# Patient Record
Sex: Male | Born: 1955 | Race: White | Hispanic: No | Marital: Single | Smoking: Former smoker
Health system: Southern US, Community
[De-identification: ages and names within clinical notes are randomized; demographics above are authoritative.]

## PROBLEM LIST (undated history)

## (undated) DIAGNOSIS — I5020 Unspecified systolic (congestive) heart failure: Secondary | ICD-10-CM

## (undated) DIAGNOSIS — I447 Left bundle-branch block, unspecified: Secondary | ICD-10-CM

## (undated) DIAGNOSIS — I4891 Unspecified atrial fibrillation: Secondary | ICD-10-CM

## (undated) DIAGNOSIS — I10 Essential (primary) hypertension: Secondary | ICD-10-CM

## (undated) DIAGNOSIS — I509 Heart failure, unspecified: Secondary | ICD-10-CM

## (undated) DIAGNOSIS — Z95810 Presence of automatic (implantable) cardiac defibrillator: Secondary | ICD-10-CM

## (undated) DIAGNOSIS — I472 Ventricular tachycardia, unspecified: Secondary | ICD-10-CM

## (undated) DIAGNOSIS — I639 Cerebral infarction, unspecified: Secondary | ICD-10-CM

## (undated) DIAGNOSIS — R55 Syncope and collapse: Secondary | ICD-10-CM

## (undated) DIAGNOSIS — I739 Peripheral vascular disease, unspecified: Secondary | ICD-10-CM

## (undated) DIAGNOSIS — E119 Type 2 diabetes mellitus without complications: Secondary | ICD-10-CM

## (undated) DIAGNOSIS — I519 Heart disease, unspecified: Secondary | ICD-10-CM

## (undated) DIAGNOSIS — E0500 Thyrotoxicosis with diffuse goiter without thyrotoxic crisis or storm: Secondary | ICD-10-CM

## (undated) DIAGNOSIS — J439 Emphysema, unspecified: Secondary | ICD-10-CM

## (undated) DIAGNOSIS — I4729 Other ventricular tachycardia: Secondary | ICD-10-CM

## (undated) DIAGNOSIS — E079 Disorder of thyroid, unspecified: Secondary | ICD-10-CM

## (undated) DIAGNOSIS — E785 Hyperlipidemia, unspecified: Secondary | ICD-10-CM

## (undated) DIAGNOSIS — R0600 Dyspnea, unspecified: Secondary | ICD-10-CM

## (undated) DIAGNOSIS — I5042 Chronic combined systolic (congestive) and diastolic (congestive) heart failure: Secondary | ICD-10-CM

## (undated) DIAGNOSIS — I731 Thromboangiitis obliterans [Buerger's disease]: Secondary | ICD-10-CM

## (undated) DIAGNOSIS — E669 Obesity, unspecified: Secondary | ICD-10-CM

## (undated) DIAGNOSIS — I2699 Other pulmonary embolism without acute cor pulmonale: Secondary | ICD-10-CM

## (undated) DIAGNOSIS — I428 Other cardiomyopathies: Secondary | ICD-10-CM

## (undated) DIAGNOSIS — R413 Other amnesia: Secondary | ICD-10-CM

## (undated) DIAGNOSIS — I5189 Other ill-defined heart diseases: Secondary | ICD-10-CM

## (undated) HISTORY — DX: Obesity, unspecified: E66.9

## (undated) HISTORY — DX: Unspecified systolic (congestive) heart failure: I50.20

## (undated) HISTORY — DX: Thyrotoxicosis with diffuse goiter without thyrotoxic crisis or storm: E05.00

## (undated) HISTORY — PX: OTHER SURGICAL HISTORY: SHX169

## (undated) HISTORY — DX: Disorder of thyroid, unspecified: E07.9

## (undated) HISTORY — DX: Aphasia: I63.9

## (undated) HISTORY — PX: FOOT SURGERY: SHX648

## (undated) HISTORY — DX: Heart failure, unspecified: I50.9

## (undated) HISTORY — DX: Type 2 diabetes mellitus without complications: E11.9

## (undated) HISTORY — DX: Ventricular tachycardia, unspecified: I47.20

## (undated) HISTORY — DX: Peripheral vascular disease, unspecified: I73.9

## (undated) HISTORY — DX: Syncope and collapse: R55

## (undated) HISTORY — DX: Other pulmonary embolism without acute cor pulmonale: I26.99

## (undated) HISTORY — DX: Chronic combined systolic (congestive) and diastolic (congestive) heart failure: I50.42

## (undated) HISTORY — DX: Other cardiomyopathies: I42.8

## (undated) HISTORY — DX: Emphysema, unspecified: J43.9

## (undated) HISTORY — DX: Other ventricular tachycardia: I47.29

## (undated) HISTORY — DX: Unspecified atrial fibrillation: I48.91

## (undated) HISTORY — DX: Heart disease, unspecified: I51.9

## (undated) HISTORY — DX: Left bundle-branch block, unspecified: I44.7

## (undated) HISTORY — DX: Hyperlipidemia, unspecified: E78.5

## (undated) HISTORY — DX: Other ill-defined heart diseases: I51.89

## (undated) HISTORY — DX: Cerebral infarction, unspecified: I63.9

## (undated) HISTORY — DX: Presence of automatic (implantable) cardiac defibrillator: Z95.810

## (undated) HISTORY — DX: Essential (primary) hypertension: I10

## (undated) HISTORY — DX: Thromboangiitis obliterans (Buerger's disease): I73.1

---

## 2009-07-17 DIAGNOSIS — S61012A Laceration without foreign body of left thumb without damage to nail, initial encounter: Secondary | ICD-10-CM | POA: Insufficient documentation

## 2014-12-19 ENCOUNTER — Emergency Department: Payer: Medicaid - Out of State

## 2014-12-19 ENCOUNTER — Encounter: Payer: Self-pay | Admitting: Emergency Medicine

## 2014-12-19 ENCOUNTER — Observation Stay
Admission: EM | Admit: 2014-12-19 | Discharge: 2014-12-20 | Disposition: A | Discharge status: Home / Self Care | Payer: Medicaid - Out of State | Attending: Internal Medicine | Admitting: Internal Medicine

## 2014-12-19 ENCOUNTER — Observation Stay
Admit: 2014-12-19 | Discharge: 2014-12-19 | Disposition: A | Discharge status: Home / Self Care | Payer: Medicaid - Out of State | Attending: Internal Medicine | Admitting: Internal Medicine

## 2014-12-19 DIAGNOSIS — J90 Pleural effusion, not elsewhere classified: Secondary | ICD-10-CM | POA: Diagnosis present

## 2014-12-19 DIAGNOSIS — F17210 Nicotine dependence, cigarettes, uncomplicated: Secondary | ICD-10-CM | POA: Diagnosis not present

## 2014-12-19 DIAGNOSIS — Z79899 Other long term (current) drug therapy: Secondary | ICD-10-CM | POA: Diagnosis not present

## 2014-12-19 DIAGNOSIS — J189 Pneumonia, unspecified organism: Secondary | ICD-10-CM | POA: Diagnosis present

## 2014-12-19 DIAGNOSIS — I824Z1 Acute embolism and thrombosis of unspecified deep veins of right distal lower extremity: Secondary | ICD-10-CM | POA: Diagnosis not present

## 2014-12-19 DIAGNOSIS — I2699 Other pulmonary embolism without acute cor pulmonale: Secondary | ICD-10-CM | POA: Diagnosis present

## 2014-12-19 DIAGNOSIS — R0602 Shortness of breath: Secondary | ICD-10-CM | POA: Insufficient documentation

## 2014-12-19 DIAGNOSIS — I5020 Unspecified systolic (congestive) heart failure: Secondary | ICD-10-CM | POA: Diagnosis present

## 2014-12-19 DIAGNOSIS — R079 Chest pain, unspecified: Secondary | ICD-10-CM | POA: Diagnosis not present

## 2014-12-19 DIAGNOSIS — J9811 Atelectasis: Secondary | ICD-10-CM | POA: Insufficient documentation

## 2014-12-19 DIAGNOSIS — R05 Cough: Secondary | ICD-10-CM | POA: Insufficient documentation

## 2014-12-19 DIAGNOSIS — Z86711 Personal history of pulmonary embolism: Secondary | ICD-10-CM | POA: Insufficient documentation

## 2014-12-19 DIAGNOSIS — Z8249 Family history of ischemic heart disease and other diseases of the circulatory system: Secondary | ICD-10-CM | POA: Insufficient documentation

## 2014-12-19 DIAGNOSIS — I420 Dilated cardiomyopathy: Secondary | ICD-10-CM | POA: Diagnosis not present

## 2014-12-19 DIAGNOSIS — I5042 Chronic combined systolic (congestive) and diastolic (congestive) heart failure: Secondary | ICD-10-CM | POA: Diagnosis present

## 2014-12-19 LAB — BASIC METABOLIC PANEL
Anion gap: 7 (ref 5–15)
BUN: 22 mg/dL — AB (ref 6–20)
CHLORIDE: 111 mmol/L (ref 101–111)
CO2: 22 mmol/L (ref 22–32)
CREATININE: 1.22 mg/dL (ref 0.61–1.24)
Calcium: 9.1 mg/dL (ref 8.9–10.3)
GFR calc Af Amer: >60 mL/min (ref >60)
GFR calc non Af Amer: >60 mL/min (ref >60)
GLUCOSE: 117 mg/dL — AB (ref 65–99)
POTASSIUM: 4.3 mmol/L (ref 3.5–5.1)
SODIUM: 140 mmol/L (ref 135–145)

## 2014-12-19 LAB — HEPATIC FUNCTION PANEL
ALBUMIN: 3.5 g/dL (ref 3.5–5.0)
ALT: 17 U/L (ref 17–63)
AST: 16 U/L (ref 15–41)
Alkaline Phosphatase: 62 U/L (ref 38–126)
BILIRUBIN DIRECT: 0.2 mg/dL (ref 0.1–0.5)
Indirect Bilirubin: 0.5 mg/dL (ref 0.3–0.9)
TOTAL PROTEIN: 6.9 g/dL (ref 6.5–8.1)
Total Bilirubin: 0.7 mg/dL (ref 0.3–1.2)

## 2014-12-19 LAB — TROPONIN I
TROPONIN I: 0.04 ng/mL — AB (ref <0.031)
TROPONIN I: 0.04 ng/mL — AB (ref <0.031)
TROPONIN I: 0.04 ng/mL — AB (ref <0.031)
Troponin I: 0.03 ng/mL (ref <0.031)
Troponin I: 0.04 ng/mL — ABNORMAL HIGH (ref <0.031)

## 2014-12-19 LAB — HEPARIN LEVEL (UNFRACTIONATED): Heparin Unfractionated: <0.1 IU/mL — ABNORMAL LOW (ref 0.30–0.70)

## 2014-12-19 LAB — APTT: APTT: 29 s (ref 24–36)

## 2014-12-19 LAB — PROTIME-INR
INR: 1.1
PROTHROMBIN TIME: 14.4 s (ref 11.4–15.0)

## 2014-12-19 LAB — CBC
HCT: 47.6 % (ref 40.0–52.0)
HEMOGLOBIN: 15.7 g/dL (ref 13.0–18.0)
MCH: 30.7 pg (ref 26.0–34.0)
MCHC: 33 g/dL (ref 32.0–36.0)
MCV: 93 fL (ref 80.0–100.0)
Platelets: 275 10*3/uL (ref 150–440)
RBC: 5.12 MIL/uL (ref 4.40–5.90)
RDW: 13.5 % (ref 11.5–14.5)
WBC: 12.3 10*3/uL — ABNORMAL HIGH (ref 3.8–10.6)

## 2014-12-19 LAB — BRAIN NATRIURETIC PEPTIDE: B Natriuretic Peptide: 794 pg/mL — ABNORMAL HIGH (ref 0.0–100.0)

## 2014-12-19 LAB — ANTITHROMBIN III: AntiThromb III Func: 110 % (ref 75–120)

## 2014-12-19 MED ORDER — IOHEXOL 350 MG/ML SOLN
100.0000 mL | Freq: Once | INTRAVENOUS | Status: AC | PRN
  Administered 2014-12-19: 100 mL via INTRAVENOUS

## 2014-12-19 MED ORDER — HEPARIN BOLUS VIA INFUSION
5000.0000 [IU] | Freq: Once | INTRAVENOUS | Status: AC
  Administered 2014-12-19: 5000 [IU] via INTRAVENOUS
  Filled 2014-12-19: qty 5000

## 2014-12-19 MED ORDER — HEPARIN SODIUM (PORCINE) 5000 UNIT/ML IJ SOLN
4000.0000 [IU] | Freq: Once | INTRAMUSCULAR | Status: DC
  Administered 2014-12-19: 4000 [IU] via INTRAVENOUS

## 2014-12-19 MED ORDER — POLYETHYLENE GLYCOL 3350 17 G PO PACK: 17.0000 g | PACK | Freq: Every day | ORAL | Status: DC | PRN

## 2014-12-19 MED ORDER — IPRATROPIUM-ALBUTEROL 0.5-2.5 (3) MG/3ML IN SOLN
3.0000 mL | Freq: Once | RESPIRATORY_TRACT | Status: AC
  Administered 2014-12-19: 3 mL via RESPIRATORY_TRACT
  Filled 2014-12-19: qty 3

## 2014-12-19 MED ORDER — ACETAMINOPHEN 650 MG RE SUPP
650.0000 mg | Freq: Four times a day (QID) | RECTAL | Status: DC | PRN
Start: 2014-12-19 — End: 2014-12-20

## 2014-12-19 MED ORDER — ONDANSETRON HCL 4 MG PO TABS: 4.0000 mg | ORAL_TABLET | Freq: Four times a day (QID) | ORAL | Status: DC | PRN

## 2014-12-19 MED ORDER — HEPARIN (PORCINE) IN NACL 100-0.45 UNIT/ML-% IJ SOLN
2150.0000 [IU]/h | INTRAMUSCULAR | Status: DC
  Administered 2014-12-19: 1800 [IU]/h via INTRAVENOUS
  Administered 2014-12-19: 2150 [IU]/h via INTRAVENOUS
  Filled 2014-12-19 (×6): qty 250

## 2014-12-19 MED ORDER — SODIUM CHLORIDE 0.9 % IJ SOLN: 3.0000 mL | Freq: Two times a day (BID) | INTRAMUSCULAR | Status: DC

## 2014-12-19 MED ORDER — ACETAMINOPHEN 325 MG PO TABS
650.0000 mg | ORAL_TABLET | Freq: Four times a day (QID) | ORAL | Status: DC | PRN
Start: 2014-12-19 — End: 2014-12-20

## 2014-12-19 MED ORDER — HEPARIN (PORCINE) IN NACL 100-0.45 UNIT/ML-% IJ SOLN
12.0000 [IU]/kg/h | Freq: Once | INTRAMUSCULAR | Status: DC
  Administered 2014-12-19: 12 [IU]/kg/h via INTRAVENOUS

## 2014-12-19 MED ORDER — SODIUM CHLORIDE 0.9 % IJ SOLN: 3.0000 mL | INTRAMUSCULAR | Status: DC | PRN

## 2014-12-19 MED ORDER — ONDANSETRON HCL 4 MG/2ML IJ SOLN: 4.0000 mg | Freq: Four times a day (QID) | INTRAMUSCULAR | Status: DC | PRN

## 2014-12-19 MED ORDER — LEVOFLOXACIN 750 MG PO TABS
750.0000 mg | ORAL_TABLET | Freq: Once | ORAL | Status: AC
  Administered 2014-12-19: 750 mg via ORAL
  Filled 2014-12-19: qty 1

## 2014-12-19 MED ORDER — HEPARIN BOLUS VIA INFUSION
3400.0000 [IU] | Freq: Once | INTRAVENOUS | Status: AC
  Administered 2014-12-19: 3400 [IU] via INTRAVENOUS
  Filled 2014-12-19: qty 3400

## 2014-12-19 MED ORDER — HYDROCODONE-ACETAMINOPHEN 5-325 MG PO TABS: 1.0000 | ORAL_TABLET | ORAL | Status: DC | PRN

## 2014-12-19 NOTE — H&P (Signed)
[facility] [place] at [facility]   PATIENT NAME: Henry Schneider    MR#:  [id number]  DATE OF BIRTH:  28-Nov-1955  DATE OF ADMISSION:  12/19/2014  PRIMARY CARE PHYSICIAN: No PCP Per Patient   REQUESTING/REFERRING PHYSICIAN: Dr. Daryel November  CHIEF COMPLAINT:   Chief Complaint  Patient presents with  . Shortness of Breath    HISTORY OF PRESENT ILLNESS:  Henry Schneider  is a 59 y.o. male with no significant medical history who presents to our emergency room with mild chest pain for 2 weeks and acute shortness of breath. He reports that about 2 weeks ago he was helping his mother move and that night when he went to bed felt sore on the left chest. The next day when he went to bed he realized he was sore on the right chest and back. For about one week he has noted that he has been short of breath with exertion. This morning he was preparing to drive back to [place] and felt so short of breath and lightheaded that he realized he would not be able to drive. Instead he came to our emergency room. CT angiography has shown pulmonary emboli in the right upper lobe and. There are also enlarged perihilar lymph nodes and a small right-sided pleural effusion with atelectasis versus infiltrate on the right. He is being admitted for further evaluation.  PAST MEDICAL HISTORY:  History reviewed. No pertinent past medical history.  PAST SURGICAL HISTORY:  History reviewed. No pertinent past surgical history.  SOCIAL HISTORY:   Social History  Substance Use Topics  . Smoking status: Current Every Day Smoker -- 1.00 packs/day    Types: Cigarettes  . Smokeless tobacco: Not on file  . Alcohol Use: 0.6 oz/week    1 Cans of beer per week    FAMILY HISTORY:   Family History  Problem Relation Age of Onset  . Coronary artery disease Mother   . Coronary artery disease Father     DRUG ALLERGIES:  No Known Allergies  REVIEW OF SYSTEMS:   Review of Systems   Constitutional: Negative for fever, chills, weight loss and malaise/fatigue.  HENT: Negative for congestion and hearing loss.   Eyes: Negative for blurred vision and pain.  Respiratory: Positive for shortness of breath. Negative for cough, hemoptysis, sputum production and stridor.   Cardiovascular: Positive for chest pain. Negative for palpitations, orthopnea and leg swelling.  Gastrointestinal: Negative for nausea, vomiting, abdominal pain, diarrhea, constipation and blood in stool.  Genitourinary: Negative for dysuria and frequency.  Musculoskeletal: Negative for myalgias, back pain, joint pain and neck pain.  Skin: Negative for rash.  Neurological: Negative for focal weakness, loss of consciousness and headaches.  Endo/Heme/Allergies: Does not bruise/bleed easily.  Psychiatric/Behavioral: Negative for depression and hallucinations. The patient is not nervous/anxious.     MEDICATIONS AT HOME:   Prior to Admission medications   Not on File      VITAL SIGNS:  Blood pressure 98/76, pulse 98, temperature 97.8 F (36.6 C), temperature source Oral, resp. rate 18, height  (1.93 m), weight 127.053 kg (280 lb 1.6 oz), SpO2 93 %.  PHYSICAL EXAMINATION:  GENERAL:  59 y.o.-year-old patient sitting in the bed with no acute distress.  EYES: Pupils equal, round, reactive to light and accommodation. No scleral icterus. Extraocular muscles intact.  HEENT: Head atraumatic, normocephalic. Oropharynx and nasopharynx clear.  NECK:  Supple, no jugular venous distention. No thyroid enlargement, no tenderness.  LUNGS: Normal breath sounds  bilaterally, no wheezing, rales,rhonchi or crepitation. No use of accessory muscles of respiration. No shortness of breath CARDIOVASCULAR: S1, S2 normal. No murmurs, rubs, or gallops.  ABDOMEN: Soft, nontender, nondistended. Bowel sounds present. No organomegaly or mass.  EXTREMITIES: No pedal edema, cyanosis, or clubbing. Referral pulses are 2+ NEUROLOGIC:  Cranial nerves II through XII are intact. Muscle strength 5/5 in all extremities. Sensation intact. Gait not checked.  PSYCHIATRIC: The patient is alert and oriented x 3.  SKIN: No obvious rash, lesion, or ulcer.   LABORATORY PANEL:   CBC  Recent Labs Lab 12/19/14 0636  WBC 12.3*  HGB 15.7  HCT 47.6  PLT 275   ------------------------------------------------------------------------------------------------------------------  Chemistries   Recent Labs Lab 12/19/14 0636 12/19/14 1208  NA 140  --   K 4.3  --   CL 111  --   CO2 22  --   GLUCOSE 117*  --   BUN 22*  --   CREATININE 1.22  --   CALCIUM 9.1  --   AST  --  16  ALT  --  17  ALKPHOS  --  62  BILITOT  --  0.7   ------------------------------------------------------------------------------------------------------------------  Cardiac Enzymes  Recent Labs Lab 12/19/14 1208  TROPONINI 0.04*   ------------------------------------------------------------------------------------------------------------------  RADIOLOGY:  Ct Angio Chest Pe W/cm &/or Wo Cm  12/19/2014  CLINICAL DATA:  Chest pain, dyspnea EXAM: CT ANGIOGRAPHY CHEST WITH CONTRAST TECHNIQUE: Multidetector CT imaging of the chest was performed using the standard protocol during bolus administration of intravenous contrast. Multiplanar CT image reconstructions and MIPs were obtained to evaluate the vascular anatomy. CONTRAST:  OMNIPAQUE IOHEXOL 350 MG/ML SOLN COMPARISON:  None. FINDINGS: Images of the thoracic inlet are unremarkable. Central airways are patent. No mediastinal hematoma or adenopathy. The study is of excellent technical quality. Borderline cardiomegaly. There is small right pleural effusion with right basilar atelectasis or infiltrate. A right hilar lymph node measures 1.4 by 1.1 cm borderline by size criteria. Left hilar lymph node measures 1.6 by 1.2 cm borderline enlarged. Axial image 60 there is pulmonary embolus noted in right upper  lobe lobar branch extending in 2 segmental branches axial image 55. Additional pulmonary embolus noted in axial image 40 right upper lobe. The right ventricle over left ventricle ratio is 0.8 without significant right heart strain. There is patchy atelectasis or infiltrate in right base anteriorly. Bilateral perihilar bronchitic changes. The visualized upper abdomen shows mild thickening of left adrenal gland without definite evidence of mass. No calcified gallstones are noted within gallbladder. There is no intrahepatic biliary ductal dilatation. Review of the MIP images confirms the above findings. IMPRESSION: 1. Right pulmonary emboli as described above. 2. Bilateral perihilar bronchitic changes. Borderline enlarged bilateral hilar lymph nodes probable reactive. 3. There is small right pleural effusion with right lower lobe atelectasis or infiltrate. 4. The ratio right ventricle over left ventricle is 0.8 without significant right heart strain. These results were called by telephone at the time of interpretation on 12/19/2014 at 9:24 am to Dr. Daryel November , who verbally acknowledged these results. Electronically Signed   By: Natasha Mead M.D.   On: 12/19/2014 09:25   US Venous Img Lower Bilateral  12/19/2014  CLINICAL DATA:  59 year old male with history of pulmonary emboli an acute onset of shortness of breath with cough, chest pain and dyspnea. Shortness of breath for 1 week. EXAM: BILATERAL LOWER EXTREMITY VENOUS DOPPLER ULTRASOUND TECHNIQUE: Gray-scale sonography with graded compression, as well as color Doppler and duplex ultrasound  were performed to evaluate the lower extremity deep venous systems from the level of the common femoral vein and including the common femoral, femoral, profunda femoral, popliteal and calf veins including the posterior tibial, peroneal and gastrocnemius veins when visible. The superficial great saphenous vein was also interrogated. Spectral Doppler was utilized to  evaluate flow at rest and with distal augmentation maneuvers in the common femoral, femoral and popliteal veins. COMPARISON:  None. FINDINGS: RIGHT LOWER EXTREMITY Common Femoral Vein: No evidence of thrombus. Normal compressibility, respiratory phasicity and response to augmentation. Saphenofemoral Junction: No evidence of thrombus. Normal compressibility and flow on color Doppler imaging. Profunda Femoral Vein: No evidence of thrombus. Normal compressibility and flow on color Doppler imaging. Femoral Vein: No evidence of thrombus. Normal compressibility, respiratory phasicity and response to augmentation. Popliteal Vein: No evidence of thrombus. Normal compressibility, respiratory phasicity and response to augmentation. Calf Veins: Occlusive thrombus in the right gastrocnemius vein. Superficial Great Saphenous Vein: No evidence of thrombus. Normal compressibility and flow on color Doppler imaging. Venous Reflux:  None. Other Findings:  None. LEFT LOWER EXTREMITY Common Femoral Vein: No evidence of thrombus. Normal compressibility, respiratory phasicity and response to augmentation. Saphenofemoral Junction: No evidence of thrombus. Normal compressibility and flow on color Doppler imaging. Profunda Femoral Vein: No evidence of thrombus. Normal compressibility and flow on color Doppler imaging. Femoral Vein: No evidence of thrombus. Normal compressibility, respiratory phasicity and response to augmentation. Popliteal Vein: No evidence of thrombus. Normal compressibility, respiratory phasicity and response to augmentation. Calf Veins: No evidence of thrombus. Normal compressibility and flow on color Doppler imaging. Superficial Great Saphenous Vein: No evidence of thrombus. Normal compressibility and flow on color Doppler imaging. Venous Reflux:  None. Other Findings:  None. IMPRESSION: 1. Study is positive for occlusive thrombus in the right gastrocnemius vein. 2. No evidence of deep venous thrombosis in the left  lower extremity. Electronically Signed   By: Trudie Reedaniel  Entrikin M.D.   On: 12/19/2014 10:46   Dg Chest Portable 1 View  12/19/2014  CLINICAL DATA:  Acute onset of shortness of breath and cough. Initial encounter. EXAM: PORTABLE CHEST 1 VIEW COMPARISON:  None. FINDINGS: The lungs are well-aerated. Right basilar airspace opacification is compatible with pneumonia. Pulmonary vascularity is at the upper limits of normal. There is no evidence of pleural effusion or pneumothorax. The cardiomediastinal silhouette is borderline normal in size. No acute osseous abnormalities are seen. IMPRESSION: Right basilar pneumonia noted. Electronically Signed   By: Roanna RaiderJeffery  Chang M.D.   On: 12/19/2014 06:54    EKG:   Orders placed or performed during the hospital encounter of 12/19/14  . ED EKG  . ED EKG  . EKG 12-Lead  . EKG 12-Lead    IMPRESSION AND PLAN:   1. Multiple pulmonary emboli: Has been started on heparin drip in the emergency room will continue for now. 2-D echocardiogram pending. Lower actually Dopplers pending. Likely due to prolonged sitting [place]. Have also obtained a hypercoagulability panel to look for predisposing factors. Will likely be able to discharge on eliquis tomorrow. Will admit for observation. Monitor on telemetry.  2. Ongoing tobacco abuse: Patient states that he quit smoking 4 days ago. Prior to that he was a one pack per day smoker. Smoking cessation counseling provided today for 3-5 minutes.  All the records are reviewed and case discussed with ED provider. Management plans discussed with the patient, family and they are in agreement.  CODE STATUS: Full   TOTAL TIME  TAKING CARE OF THIS PATIENT: 35 minutes.  Greater than 50% of time spent in coordination of care and counseling.  Elby Showers M.D on 12/19/2014 at 2:42 PM  Between 7am to 6pm - Pager - [phone]  After 6pm go to [email or web address] - password [email or web address] [facility]  [facility]  Office  [phone]  CC: Primary care physician; No PCP Per Patient

## 2014-12-19 NOTE — ED Notes (Signed)
Dr. Williams notified of critical trop of 0.04 

## 2014-12-19 NOTE — Progress Notes (Addendum)
ANTICOAGULATION CONSULT NOTE - Initial Consult  Pharmacy Consult for Heparin  Indication: pulmonary embolus  No Known Allergies  Patient Measurements: Height: 6\' 4"  (193 cm) Weight: 280 lb 1.6 oz (127.053 kg) IBW/kg (Calculated) : 86.8 Heparin Dosing Weight: 112  Vital Signs: Temp: 97.8 F (36.6 C) (11/21 1059) Temp Source: Oral (11/21 1059) BP: 98/76 mmHg (11/21 1059) Pulse Rate: 98 (11/21 1059)  Labs:  Recent Labs  12/19/14 0636 12/19/14 0828 12/19/14 1208 12/19/14 1425 12/19/14 1551  HGB 15.7  --   --   --   --   HCT 47.6  --   --   --   --   PLT 275  --   --   --   --   APTT 29  --   --   --   --   LABPROT 14.4  --   --   --   --   INR 1.10  --   --   --   --   HEPARINUNFRC  --   --   --  <0.10*  --   CREATININE 1.22  --   --   --   --   TROPONINI 0.04* 0.03 0.04*  --  0.04*    Estimated Creatinine Clearance: 97.2 mL/min (by C-G formula based on Cr of 1.22).   Medical History: History reviewed. No pertinent past medical history.  Medications:  Infusions:  . heparin 1,800 Units/hr (12/19/14 1032)    Assessment: 59 yo male admitted with multiple pulmonary emboli and pleural effusion per images.  Goal of Therapy:  Heparin level 0.3-0.7 units/ml Monitor platelets by anticoagulation protocol: Yes   Plan:  Give 5000 units bolus x 1 Start heparin infusion at 1800 units/hr Check anti-Xa level in 6 hours and daily while on heparin Continue to monitor H&H and platelets   1121  :  HL @ 14:30 = < 0.1               Will order Heparin bolus 3400 units and increase drip rate to 2150 units/hr.              Will recheck HL 6 hrs after rate change on 11/22 @ 0100.   Jahmeek Shirk D 12/19/2014,6:41 PM

## 2014-12-19 NOTE — Progress Notes (Signed)
ANTICOAGULATION CONSULT NOTE - Initial Consult  Pharmacy Consult for Heparin  Indication: pulmonary embolus  No Known Allergies  Patient Measurements: Height: 6\' 4"  (193 cm) Weight: 270 lb (122.471 kg) IBW/kg (Calculated) : 86.8 Heparin Dosing Weight: 112  Vital Signs: Temp: 97.6 F (36.4 C) (11/21 0633) Temp Source: Oral (11/21 16100633) BP: 111/84 mmHg (11/21 0900) Pulse Rate: 105 (11/21 0900)  Labs:  Recent Labs  12/19/14 0636 12/19/14 0828  HGB 15.7  --   HCT 47.6  --   PLT 275  --   APTT 29  --   LABPROT 14.4  --   INR 1.10  --   CREATININE 1.22  --   TROPONINI 0.04* 0.03    Estimated Creatinine Clearance: 95.5 mL/min (by C-G formula based on Cr of 1.22).   Medical History: History reviewed. No pertinent past medical history.  Medications:  Infusions:  . heparin 1,800 Units/hr (12/19/14 1032)    Assessment: 59 yo male admitted with multiple pulmonary emboli and pleural effusion per images.  Goal of Therapy:  Heparin level 0.3-0.7 units/ml Monitor platelets by anticoagulation protocol: Yes   Plan:  Give 5000 units bolus x 1 Start heparin infusion at 1800 units/hr Check anti-Xa level in 6 hours and daily while on heparin Continue to monitor H&H and platelets  Keymarion Bearman K 12/19/2014,10:00 AM

## 2014-12-19 NOTE — ED Notes (Signed)
Pt returned from CT, resting in bed in no acute distress 

## 2014-12-19 NOTE — Progress Notes (Signed)
Skin checked by Donivan ScullAmy Dalton RN. Pt reports no pain. NSR. Room air. Takes meds ok. Pt has no further concerns at this time.

## 2014-12-19 NOTE — ED Notes (Signed)
Heparin gtt requested from pharmacy

## 2014-12-19 NOTE — Progress Notes (Signed)
MD Nemiah CommanderKalisetti was made aware of positive US of lower legs.

## 2014-12-19 NOTE — ED Provider Notes (Signed)
[facility] Emergency Department Provider Note     Time seen: ----------------------------------------- 7:02 AM on 12/19/2014 -----------------------------------------    I have reviewed the triage vital signs and the nursing notes.   HISTORY  Chief Complaint Shortness of Breath    HPI Henry Schneider is a 59 y.o. male who presents ER for shortness breath last week with nonproductive cough.Patient denies any recent illness, denies history of same. Patient states he was assisting his sister moving a car when the symptoms began. Patient states breathing worsens with exertion and lying flat.  Past medical history: Berger's disease  There are no active problems to display for this patient.   History reviewed. No pertinent past surgical history.  Allergies Review of patient's allergies indicates no known allergies.  Social History Social History  Substance Use Topics  . Smoking status: Current Every Day Smoker -- 1.00 packs/day    Types: Cigarettes  . Smokeless tobacco: None  . Alcohol Use: No    Review of Systems Constitutional: Negative for fever. Eyes: Negative for visual changes. ENT: Negative for sore throat. Cardiovascular: Negative for chest pain. Respiratory: Positive for shortness of breath and cough Gastrointestinal: Negative for abdominal pain, vomiting and diarrhea. Genitourinary: Negative for dysuria. Musculoskeletal: Negative for back pain. Skin: Negative for rash. Neurological: Negative for headaches, focal weakness or numbness.  10-point ROS otherwise negative.  ____________________________________________   PHYSICAL EXAM:  VITAL SIGNS: ED Triage Vitals  Enc Vitals Group     BP 12/19/14 0633 128/104 mmHg     Pulse Rate 12/19/14 0633 115     Resp 12/19/14 0633 22     Temp 12/19/14 0633 97.6 F (36.4 C)     Temp Source 12/19/14 0633 Oral     SpO2 12/19/14 0633 100 %     Weight 12/19/14 0633 270 lb (122.471 kg)      Height 12/19/14 0633  (1.93 m)     Head Cir --      Peak Flow --      Pain Score --      Pain Loc --      Pain Edu? --      Excl. in GC? --     Constitutional: Alert and oriented. Well appearing and in no distress. Eyes: Conjunctivae are normal. PERRL. Normal extraocular movements. ENT   Head: Normocephalic and atraumatic.   Nose: No congestion/rhinnorhea.   Mouth/Throat: Mucous membranes are moist.   Neck: No stridor. Cardiovascular: Rapid rate, regular rhythm. Normal and symmetric distal pulses are present in all extremities. No murmurs, rubs, or gallops. Respiratory: Normal respiratory effort without tachypnea nor retractions. Breath sounds are clear and equal bilaterally. No wheezes/rales/rhonchi. Gastrointestinal: Soft and nontender. No distention. No abdominal bruits.  Musculoskeletal: Nontender with normal range of motion in all extremities. No joint effusions.  No lower extremity tenderness nor edema. Neurologic:  Normal speech and language. No gross focal neurologic deficits are appreciated. Speech is normal. No gait instability. Skin:  Skin is warm, dry and intact. No rash noted. Psychiatric: Mood and affect are normal. Speech and behavior are normal. Patient exhibits appropriate insight and judgment. ____________________________________________  EKG: Interpreted by me. Sinus tachycardia with a rate of 107 bpm, left bundle branch block, left axis deviation.  ____________________________________________  ED COURSE:  Pertinent labs & imaging results that were available during my care of the patient were reviewed by me and considered in my medical decision making (see chart for details). Patient received basic labs, chest x-ray and reevaluation.  ____________________________________________    LABS (pertinent positives/negatives)  Labs Reviewed  CBC - Abnormal; Notable for the following:    WBC 12.3 (*)    All other components within normal limits   BASIC METABOLIC PANEL - Abnormal; Notable for the following:    Glucose, Bld 117 (*)    BUN 22 (*)    All other components within normal limits  TROPONIN I - Abnormal; Notable for the following:    Troponin I 0.04 (*)    All other components within normal limits  BRAIN NATRIURETIC PEPTIDE - Abnormal; Notable for the following:    B Natriuretic Peptide 794.0 (*)    All other components within normal limits  TROPONIN I   CRITICAL CARE Performed by: Emily FilbertWilliams, Rynlee Lisbon E   Total critical care time: 30 minutes  Critical care time was exclusive of separately billable procedures and treating other patients.  Critical care was necessary to treat or prevent imminent or life-threatening deterioration.  Critical care was time spent personally by me on the following activities: development of treatment plan with patient and/or surrogate as well as nursing, discussions with consultants, evaluation of patient's response to treatment, examination of patient, obtaining history from patient or surrogate, ordering and performing treatments and interventions, ordering and review of laboratory studies, ordering and review of radiographic studies, pulse oximetry and re-evaluation of patient's condition.   RADIOLOGY Images were viewed by me   IMPRESSION: Right basilar pneumonia noted. IMPRESSION: 1. Right pulmonary emboli as described above. 2. Bilateral perihilar bronchitic changes. Borderline enlarged bilateral hilar lymph nodes probable reactive. 3. There is small right pleural effusion with right lower lobe atelectasis or infiltrate. 4. The ratio right ventricle over left ventricle is 0.8 without significant right heart strain. These results were called by telephone at the time of interpretation on 12/19/2014 at 9:24 am to Dr. Daryel NovemberJONATHAN Argentina Kosch , who verbally acknowledged these results. ____________________________________________  FINAL ASSESSMENT AND PLAN  Multiple pulmonary emboli,  pleural effusion  Plan: Patient with labs and imaging as dictated above. Patient initially presented more with pneumonia-type picture. He does not remember an acute event where he became short of breath. He is just noted gradual shortness of breath and increased shortness of breath with laying down or exertion. Patient is being placed on heparin, will need extremity Dopplers and observation.   Emily FilbertWilliams, Torrin Frein E, MD   Emily FilbertJonathan E Elion Hocker, MD 12/19/14 [phone]

## 2014-12-19 NOTE — Progress Notes (Signed)
*  PRELIMINARY RESULTS* Echocardiogram 2D Echocardiogram has been performed.  Georgann HousekeeperJerry R Hege 12/19/2014, 12:03 PM

## 2014-12-19 NOTE — ED Notes (Addendum)
Patient transported to Ultrasound, [facility] will transport pt when he returns

## 2014-12-19 NOTE — ED Notes (Signed)
Patient transported to CT 

## 2014-12-19 NOTE — ED Notes (Addendum)
Patient ambulatory to triage with steady gait, without difficulty or distress noted; pt reports SHOB x week with nonprod cough; denies any recent illness; denies hx of same; denies pain; st week ago was assisting his sister moving a chair when symptoms began; st breathing worsens with exertion and lying supine

## 2014-12-20 DIAGNOSIS — I5020 Unspecified systolic (congestive) heart failure: Secondary | ICD-10-CM | POA: Diagnosis present

## 2014-12-20 DIAGNOSIS — J189 Pneumonia, unspecified organism: Secondary | ICD-10-CM | POA: Diagnosis present

## 2014-12-20 DIAGNOSIS — I5042 Chronic combined systolic (congestive) and diastolic (congestive) heart failure: Secondary | ICD-10-CM | POA: Diagnosis present

## 2014-12-20 LAB — BASIC METABOLIC PANEL
Anion gap: 9 (ref 5–15)
BUN: 19 mg/dL (ref 6–20)
CHLORIDE: 110 mmol/L (ref 101–111)
CO2: 21 mmol/L — AB (ref 22–32)
CREATININE: 0.89 mg/dL (ref 0.61–1.24)
Calcium: 9.2 mg/dL (ref 8.9–10.3)
GFR calc non Af Amer: >60 mL/min (ref >60)
Glucose, Bld: 101 mg/dL — ABNORMAL HIGH (ref 65–99)
POTASSIUM: 4.2 mmol/L (ref 3.5–5.1)
Sodium: 140 mmol/L (ref 135–145)

## 2014-12-20 LAB — CBC
HCT: 45.1 % (ref 40.0–52.0)
HEMATOCRIT: 45.4 % (ref 40.0–52.0)
HEMOGLOBIN: 15.3 g/dL (ref 13.0–18.0)
HEMOGLOBIN: 15.3 g/dL (ref 13.0–18.0)
MCH: 31 pg (ref 26.0–34.0)
MCH: 30.8 pg (ref 26.0–34.0)
MCHC: 33.7 g/dL (ref 32.0–36.0)
MCHC: 33.8 g/dL (ref 32.0–36.0)
MCV: 91.9 fL (ref 80.0–100.0)
MCV: 91.1 fL (ref 80.0–100.0)
PLATELETS: 266 10*3/uL (ref 150–440)
Platelets: 266 10*3/uL (ref 150–440)
RBC: 4.94 MIL/uL (ref 4.40–5.90)
RBC: 4.95 MIL/uL (ref 4.40–5.90)
RDW: 13.4 % (ref 11.5–14.5)
RDW: 13.4 % (ref 11.5–14.5)
WBC: 10.8 10*3/uL — ABNORMAL HIGH (ref 3.8–10.6)
WBC: 9.7 10*3/uL (ref 3.8–10.6)

## 2014-12-20 LAB — HEPARIN LEVEL (UNFRACTIONATED)
HEPARIN UNFRACTIONATED: 0.43 [IU]/mL (ref 0.30–0.70)
HEPARIN UNFRACTIONATED: 0.38 [IU]/mL (ref 0.30–0.70)

## 2014-12-20 MED ORDER — LISINOPRIL 5 MG PO TABS: 5.0000 mg | ORAL_TABLET | Freq: Every day | ORAL | Status: DC

## 2014-12-20 MED ORDER — LISINOPRIL 5 MG PO TABS
5.0000 mg | ORAL_TABLET | Freq: Every day | ORAL | Status: DC
  Administered 2014-12-20: 5 mg via ORAL
  Filled 2014-12-20: qty 1

## 2014-12-20 MED ORDER — APIXABAN 5 MG PO TABS: 10.0000 mg | ORAL_TABLET | Freq: Two times a day (BID) | ORAL | Status: DC

## 2014-12-20 MED ORDER — APIXABAN 5 MG PO TABS: 5.0000 mg | ORAL_TABLET | Freq: Two times a day (BID) | ORAL | Status: DC

## 2014-12-20 MED ORDER — APIXABAN 5 MG PO TABS
10.0000 mg | ORAL_TABLET | Freq: Two times a day (BID) | ORAL | Status: DC
  Administered 2014-12-20 (×2): 10 mg via ORAL
  Filled 2014-12-20 (×2): qty 2

## 2014-12-20 MED ORDER — CARVEDILOL 3.125 MG PO TABS
3.1250 mg | ORAL_TABLET | Freq: Two times a day (BID) | ORAL | Status: DC
  Filled 2014-12-20: qty 1

## 2014-12-20 MED ORDER — CARVEDILOL 3.125 MG PO TABS: 3.1250 mg | ORAL_TABLET | Freq: Two times a day (BID) | ORAL | Status: DC

## 2014-12-20 NOTE — Discharge Instructions (Signed)
Please follow-up with your primary care doctor as scheduled on 12/26/2014  DIET:  Low salt diet  DISCHARGE CONDITION:  Fair  ACTIVITY:  Activity as tolerated  OXYGEN:  Home Oxygen: No.   Oxygen Delivery: room air  DISCHARGE LOCATION:  home   If you experience worsening of your admission symptoms, develop shortness of breath, life threatening emergency, suicidal or homicidal thoughts you must seek medical attention immediately by calling 911 or calling your MD immediately  if symptoms less severe.  You Must read complete instructions/literature along with all the possible adverse reactions/side effects for all the Medicines you take and that have been prescribed to you. Take any new Medicines after you have completely understood and accpet all the possible adverse reactions/side effects.   Please note  You were cared for by a hospitalist during your hospital stay. If you have any questions about your discharge medications or the care you received while you were in the hospital after you are discharged, you can call the unit and asked to speak with the hospitalist on call if the hospitalist that took care of you is not available. Once you are discharged, your primary care physician will handle any further medical issues. Please note that NO REFILLS for any discharge medications will be authorized once you are discharged, as it is imperative that you return to your primary care physician (or establish a relationship with a primary care physician if you do not have one) for your aftercare needs so that they can reassess your need for medications and monitor your lab values.

## 2014-12-20 NOTE — Progress Notes (Signed)
ANTICOAGULATION CONSULT NOTE - Initial Consult  Pharmacy Consult for Heparin  Indication: pulmonary embolus  No Known Allergies  Patient Measurements: Height: 6\' 4"  (193 cm) Weight: 280 lb 1.6 oz (127.053 kg) IBW/kg (Calculated) : 86.8 Heparin Dosing Weight: 112  Vital Signs: Temp: 97.6 F (36.4 C) (11/21 2016) Temp Source: Oral (11/21 2016) BP: 109/89 mmHg (11/21 2024) Pulse Rate: 140 (11/21 2024)  Labs:  Recent Labs  12/19/14 0636  12/19/14 1208 12/19/14 1425 12/19/14 1551 12/19/14 2141 12/20/14 0235  HGB 15.7  --   --   --   --   --   --   HCT 47.6  --   --   --   --   --   --   PLT 275  --   --   --   --   --   --   APTT 29  --   --   --   --   --   --   LABPROT 14.4  --   --   --   --   --   --   INR 1.10  --   --   --   --   --   --   HEPARINUNFRC  --   --   --  <0.10*  --   --  0.43  CREATININE 1.22  --   --   --   --   --   --   TROPONINI 0.04*  < > 0.04*  --  0.04* 0.04*  --   < > = values in this interval not displayed.  Estimated Creatinine Clearance: 97.2 mL/min (by C-G formula based on Cr of 1.22).   Medical History: History reviewed. No pertinent past medical history.  Medications:  Infusions:  . heparin 2,150 Units/hr (12/19/14 2225)    Assessment: 59 yo male admitted with multiple pulmonary emboli and pleural effusion per images.  Goal of Therapy:  Heparin level 0.3-0.7 units/ml Monitor platelets by anticoagulation protocol: Yes   Plan:  Give 5000 units bolus x 1 Start heparin infusion at 1800 units/hr Check anti-Xa level in 6 hours and daily while on heparin Continue to monitor H&H and platelets   1121  :  HL @ 14:30 = < 0.1               Will order Heparin bolus 3400 units and increase drip rate to 2150 units/hr.              Will recheck HL 6 hrs after rate change on 11/22 @ 0100.   1122 0235 heparin level therapeutic, continue current rate and will confirm lab in 6 hours.  Carola FrostNathan A Jaicee Michelotti, Pharm.D., BCPS Clinical  Pharmacist 12/20/2014,3:42 AM

## 2014-12-20 NOTE — Consult Note (Signed)
ANTICOAGULATION CONSULT NOTE - Initial Consult  Pharmacy Consult for apixiban Indication: PE/DVT  No Known Allergies  Patient Measurements: Height: 6\' 4"  (193 cm) Weight: 280 lb 1.6 oz (127.053 kg) IBW/kg (Calculated) : 86.8 Heparin Dosing Weight:   Vital Signs: Temp: 97.5 F (36.4 C) (11/22 1123) Temp Source: Oral (11/22 1123) BP: 104/62 mmHg (11/22 1123) Pulse Rate: 96 (11/22 1123)  Labs:  Recent Labs  12/19/14 0636  12/19/14 1208 12/19/14 1425 12/19/14 1551 12/19/14 2141 12/20/14 0235 12/20/14 0828  HGB 15.7  --   --   --   --   --  15.3 15.3  HCT 47.6  --   --   --   --   --  45.4 45.1  PLT 275  --   --   --   --   --  266 266  APTT 29  --   --   --   --   --   --   --   LABPROT 14.4  --   --   --   --   --   --   --   INR 1.10  --   --   --   --   --   --   --   HEPARINUNFRC  --   --   --  <0.10*  --   --  0.43 0.38  CREATININE 1.22  --   --   --   --   --  0.89  --   TROPONINI 0.04*  < > 0.04*  --  0.04* 0.04*  --   --   < > = values in this interval not displayed.  Estimated Creatinine Clearance: 133.3 mL/min (by C-G formula based on Cr of 0.89).   Medical History: History reviewed. No pertinent past medical history.  Medications:  Scheduled:  . carvedilol  3.125 mg Oral BID WC  . lisinopril  5 mg Oral Daily    Assessment: Pt is a 59 year old male with a DVT and multiple PE. Patient currently on a heparin drip. Pharmacy consulted to switch patient to apixaban.   Goal of Therapy:   Monitor platelets by anticoagulation protocol: Yes   Plan:  Apixaban 10mg  po bid for 7 days then 5mg  bid. Will turn off heparin drip with first dose of apixaban. Pharmacy to continue to monitor.  Tashema Tiller D Caci Orren 12/20/2014,11:29 AM

## 2014-12-20 NOTE — Consult Note (Signed)
[facility] Cardiology  CARDIOLOGY CONSULT NOTE  Patient ID: Henry Schneider MRN: [id number] DOB/AGE: 1955/10/23 59 y.o.  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 The patient apparently had been in his usual state of health until approximately 3 weeks ago when he experienced focal, sharp left-sided chest discomfort to help in his mother removed. At evening, patient had difficulty comfortable in bed. The patient has been updating the house and yard the past several weeks, been very active without symptoms. He currently is a resident of [place], planning to return decided that he felt too short of breath drive and went to [facility] emergency room 12/19/14. Chest CT was performed which revealed evidence for an upper lobe pulmonary embolus and the patient was treated with apixaban. 2-D echocardiogram was performed which revealed dilated cardiomyopathy with LV ejection fraction of 15%. Patient currently denies chest pain. He does experience orthopnea since he's been in the hospital. He denies peripheral edema.  Review of systems complete and found to be negative unless listed above     History reviewed. No pertinent past medical history.  History reviewed. No pertinent past surgical history.  No prescriptions prior to admission   Social History   Social History  . Marital Status: Single    Spouse Name: N/A  . Number of Children: N/A  . Years of Education: N/A   Occupational History  . Not on file.   Social History Main Topics  . Smoking status: Current Every Day Smoker -- 1.00 packs/day    Types: Cigarettes  . Smokeless tobacco: Not on file  . Alcohol Use: 0.6 oz/week    1 Cans of beer per week  . Drug Use: Not on file  . Sexual Activity: Yes   Other Topics Concern  . Not on file   Social History Narrative  . No  narrative on file    Family History  Problem Relation Age of Onset  . Coronary artery disease Mother   . Coronary artery disease Father       Review of systems complete and found to be negative unless listed above      PHYSICAL EXAM  General: Well developed, well nourished, in no acute distress HEENT:  Normocephalic and atramatic Neck:  No JVD.  Lungs: Clear bilaterally to auscultation and percussion. Heart: HRRR . Normal S1 and S2 without gallops or murmurs.  Abdomen: Bowel sounds are positive, abdomen soft and non-tender  Msk:  Back normal, normal gait. Normal strength and tone for age. Extremities: No clubbing, cyanosis or edema.   Neuro: Alert and oriented X 3. Psych:  Good affect, responds appropriately  Labs:   Lab Results  Component Value Date   WBC 9.7 12/20/2014   HGB 15.3 12/20/2014   HCT 45.1 12/20/2014   MCV 91.1 12/20/2014   PLT 266 12/20/2014    Recent Labs Lab 12/19/14 1208 12/20/14 0235  NA  --  140  K  --  4.2  CL  --  110  CO2  --  21*  BUN  --  19  CREATININE  --  0.89  CALCIUM  --  9.2  PROT 6.9  --   BILITOT 0.7  --   ALKPHOS 62  --   ALT 17  --   AST 16  --   GLUCOSE  --  101*   Lab Results  Component Value Date   TROPONINI  0.04* 12/19/2014   No results found for: CHOL No results found for: HDL No results found for: LDLCALC No results found for: TRIG No results found for: CHOLHDL No results found for: LDLDIRECT    Radiology: Ct Angio Chest Pe W/cm &/or Wo Cm  12/19/2014  CLINICAL DATA:  Chest pain, dyspnea EXAM: CT ANGIOGRAPHY CHEST WITH CONTRAST TECHNIQUE: Multidetector CT imaging of the chest was performed using the standard protocol during bolus administration of intravenous contrast. Multiplanar CT image reconstructions and MIPs were obtained to evaluate the vascular anatomy. CONTRAST:  OMNIPAQUE IOHEXOL 350 MG/ML SOLN COMPARISON:  None. FINDINGS: Images of the thoracic inlet are unremarkable. Central airways are  patent. No mediastinal hematoma or adenopathy. The study is of excellent technical quality. Borderline cardiomegaly. There is small right pleural effusion with right basilar atelectasis or infiltrate. A right hilar lymph node measures 1.4 by 1.1 cm borderline by size criteria. Left hilar lymph node measures 1.6 by 1.2 cm borderline enlarged. Axial image 60 there is pulmonary embolus noted in right upper lobe lobar branch extending in 2 segmental branches axial image 55. Additional pulmonary embolus noted in axial image 40 right upper lobe. The right ventricle over left ventricle ratio is 0.8 without significant right heart strain. There is patchy atelectasis or infiltrate in right base anteriorly. Bilateral perihilar bronchitic changes. The visualized upper abdomen shows mild thickening of left adrenal gland without definite evidence of mass. No calcified gallstones are noted within gallbladder. There is no intrahepatic biliary ductal dilatation. Review of the MIP images confirms the above findings. IMPRESSION: 1. Right pulmonary emboli as described above. 2. Bilateral perihilar bronchitic changes. Borderline enlarged bilateral hilar lymph nodes probable reactive. 3. There is small right pleural effusion with right lower lobe atelectasis or infiltrate. 4. The ratio right ventricle over left ventricle is 0.8 without significant right heart strain. These results were called by telephone at the time of interpretation on 12/19/2014 at 9:24 am to Dr. Daryel November , who verbally acknowledged these results. Electronically Signed   By: Natasha Mead M.D.   On: 12/19/2014 09:25   US Venous Img Lower Bilateral  12/19/2014  CLINICAL DATA:  59 year old male with history of pulmonary emboli an acute onset of shortness of breath with cough, chest pain and dyspnea. Shortness of breath for 1 week. EXAM: BILATERAL LOWER EXTREMITY VENOUS DOPPLER ULTRASOUND TECHNIQUE: Gray-scale sonography with graded compression, as well as  color Doppler and duplex ultrasound were performed to evaluate the lower extremity deep venous systems from the level of the common femoral vein and including the common femoral, femoral, profunda femoral, popliteal and calf veins including the posterior tibial, peroneal and gastrocnemius veins when visible. The superficial great saphenous vein was also interrogated. Spectral Doppler was utilized to evaluate flow at rest and with distal augmentation maneuvers in the common femoral, femoral and popliteal veins. COMPARISON:  None. FINDINGS: RIGHT LOWER EXTREMITY Common Femoral Vein: No evidence of thrombus. Normal compressibility, respiratory phasicity and response to augmentation. Saphenofemoral Junction: No evidence of thrombus. Normal compressibility and flow on color Doppler imaging. Profunda Femoral Vein: No evidence of thrombus. Normal compressibility and flow on color Doppler imaging. Femoral Vein: No evidence of thrombus. Normal compressibility, respiratory phasicity and response to augmentation. Popliteal Vein: No evidence of thrombus. Normal compressibility, respiratory phasicity and response to augmentation. Calf Veins: Occlusive thrombus in the right gastrocnemius vein. Superficial Great Saphenous Vein: No evidence of thrombus. Normal compressibility and flow on color Doppler imaging. Venous Reflux:  None. Other Findings:  None. LEFT LOWER EXTREMITY Common Femoral Vein: No evidence of thrombus. Normal compressibility, respiratory phasicity and response to augmentation. Saphenofemoral Junction: No evidence of thrombus. Normal compressibility and flow on color Doppler imaging. Profunda Femoral Vein: No evidence of thrombus. Normal compressibility and flow on color Doppler imaging. Femoral Vein: No evidence of thrombus. Normal compressibility, respiratory phasicity and response to augmentation. Popliteal Vein: No evidence of thrombus. Normal compressibility, respiratory phasicity and response to augmentation.  Calf Veins: No evidence of thrombus. Normal compressibility and flow on color Doppler imaging. Superficial Great Saphenous Vein: No evidence of thrombus. Normal compressibility and flow on color Doppler imaging. Venous Reflux:  None. Other Findings:  None. IMPRESSION: 1. Study is positive for occlusive thrombus in the right gastrocnemius vein. 2. No evidence of deep venous thrombosis in the left lower extremity. Electronically Signed   By: Trudie Reedaniel  Entrikin M.D.   On: 12/19/2014 10:46   Dg Chest Portable 1 View  12/19/2014  CLINICAL DATA:  Acute onset of shortness of breath and cough. Initial encounter. EXAM: PORTABLE CHEST 1 VIEW COMPARISON:  None. FINDINGS: The lungs are well-aerated. Right basilar airspace opacification is compatible with pneumonia. Pulmonary vascularity is at the upper limits of normal. There is no evidence of pleural effusion or pneumothorax. The cardiomediastinal silhouette is borderline normal in size. No acute osseous abnormalities are seen. IMPRESSION: Right basilar pneumonia noted. Electronically Signed   By: Roanna RaiderJeffery  Chang M.D.   On: 12/19/2014 06:54    EKG: Normal sinus rhythm  ASSESSMENT AND PLAN:   1. Pulmonary emboli, on apixaban 2. Dilated cardiomyopathy, of unknown duration, of unknown etiology, borderline elevated troponin likely due to PE  Recommendations  1. Agree with overall current therapy 2. Continue apixaban for treatment of pulmonary emboli 3. Agree with starting carvedilol and ACE inhibitor 4. Patient will likely require continued outpatient cardiology workup for dilated cardiomyopathy, including nuclear study and/or cardiac catheterization. Patient plans to return to [place] later this week, has scheduled appointment with new primary care provider. I suggested that the patient request a referral to a cardiologist for further treatment and evaluation of dilated cardiomyopathy.   SignedMarcina Millard: Breeze Berringer MD,PhD, [facility] 12/20/2014, 5:41  PM

## 2014-12-20 NOTE — Progress Notes (Signed)
ANTICOAGULATION CONSULT NOTE - Initial Consult  Pharmacy Consult for Heparin  Indication: pulmonary embolus  No Known Allergies  Patient Measurements: Height: 6\' 4"  (193 cm) Weight: 280 lb 1.6 oz (127.053 kg) IBW/kg (Calculated) : 86.8 Heparin Dosing Weight: 112  Vital Signs: Temp: 97.4 F (36.3 C) (11/22 0443) Temp Source: Oral (11/22 0443) BP: 110/77 mmHg (11/22 0745) Pulse Rate: 103 (11/22 0745)  Labs:  Recent Labs  12/19/14 0636  12/19/14 1208 12/19/14 1425 12/19/14 1551 12/19/14 2141 12/20/14 0235 12/20/14 0828  HGB 15.7  --   --   --   --   --  15.3  --   HCT 47.6  --   --   --   --   --  45.4  --   PLT 275  --   --   --   --   --  266  --   APTT 29  --   --   --   --   --   --   --   LABPROT 14.4  --   --   --   --   --   --   --   INR 1.10  --   --   --   --   --   --   --   HEPARINUNFRC  --   --   --  <0.10*  --   --  0.43 0.38  CREATININE 1.22  --   --   --   --   --  0.89  --   TROPONINI 0.04*  < > 0.04*  --  0.04* 0.04*  --   --   < > = values in this interval not displayed.  Estimated Creatinine Clearance: 133.3 mL/min (by C-G formula based on Cr of 0.89).   Medical History: History reviewed. No pertinent past medical history.  Medications:  Infusions:  . heparin 2,150 Units/hr (12/19/14 2225)    Assessment: 59 yo male admitted with multiple pulmonary emboli and pleural effusion per images.  Goal of Therapy:  Heparin level 0.3-0.7 units/ml Monitor platelets by anticoagulation protocol: Yes   Plan:  Give 5000 units bolus x 1 Start heparin infusion at 1800 units/hr Check anti-Xa level in 6 hours and daily while on heparin Continue to monitor H&H and platelets   1121  :  HL @ 14:30 = < 0.1               Will order Heparin bolus 3400 units and increase drip rate to 2150 units/hr.              Will recheck HL 6 hrs after rate change on 11/22 @ 0100.   1122 0235 heparin level therapeutic, continue current rate and will confirm lab in 6  hours. 1122 0828 heparin level therapeutic at 0.38. Continue current rate and check daily.  Olene FlossMelissa D Martavia Tye, Pharm.D., BCPS Clinical Pharmacist 12/20/2014,10:32 AM

## 2014-12-20 NOTE — Care Management (Signed)
Patient admitted with PE and positive US of lower legs.  Patient is from [place].  Patient [place] moving to [place].  Patient states that he has a house here, but it does not have an occupancy permit.  Patient states that he does not have a source of income, and does not have any family in [place].  Patient states that his brother lives near him in [place].  Plan for patient to discharge on Eliquis.  Patient [place] active Medicaid in [place].  I explained to him that Eliquis can be an expensive medication, so if he plans on moving to [place] it is imperative that he get is Medicaid transferred.  Dr. Clent RidgesWalsh made aware of information provided by patient.  Dr. Clent RidgesWalsh has set the patient up with a new patient [place].  Dr. Clent RidgesWalsh has also provided patient with 30 supply card for Eliquis.  Per patient he is planning driving back to [place] over a period of 2 days, follow up with his PCP, and continue living in [place] "for now"

## 2014-12-20 NOTE — Progress Notes (Signed)
Discharge instructions explained to pt/ verbalized an understanding/ iv and tele removed/ rx given to pt/  Night dose of Eliquis given to pt before discharge- ok to give early per Pharm/ pt will get RX filled tom ( Eliquis coupon given to pt./ refused wheelchair/ ambulated off unit.

## 2014-12-20 NOTE — Discharge Summary (Signed)
[facility] [place] at [facility]  DISCHARGE SUMMARY   PATIENT NAME: Henry Schneider    MR#:  [id number]  DATE OF BIRTH:  [birth year]  DATE OF ADMISSION:  12/19/2014 ADMITTING PHYSICIAN: Gale Journey, MD  DATE OF DISCHARGE: 12/20/2014  PRIMARY CARE PHYSICIAN: No PCP Per Patient    ADMISSION DIAGNOSIS:  Pleural effusion [J90] Pulmonary emboli (HCC) [I26.99] Other acute pulmonary embolism without acute cor pulmonale (HCC) [I26.99]  DISCHARGE DIAGNOSIS:  Active Problems:   Pulmonary emboli (HCC)   Systolic heart failure (HCC)   SECONDARY DIAGNOSIS:  History reviewed. No pertinent past medical history.  HOSPITAL COURSE:   1. Multiple pulmonary emboli: Was started on heparin drip in the emergency room and admitted for observation. Now started on request. 30 day free coupon for [employer]ian was given. He also has a lower extremity DVT. He is being given printouts of his CT scan to show to his new primary care provider next week. There is no sign of right heart strain on 2-D echocardiogram. He does not require supplemental oxygen. Pain is controlled. No signs of pneumonia on chest CT. Hypercoagulability panel still pending. DVT and PE likely triggered by prolonged sitting while driving back and forth between [place] and [place].  2. New systolic congestive heart failure: 2-D echocardiogram shows ejection fraction of 15%. This was discussed with cardiology Dr. Darrold Junker who stated that the patient could be discharged safely. He stated that no cardiac workup would be performed in the setting of acute pulmonary embolism. I have started Coreg and low-dose lisinopril. I have printed out the report of his 2-D echocardiogram for the patient to show to his new primary care provider next week. Patient has been given CHF education packet from nursing. He has also been advised to start low-sodium diet and daily weights.  3. Ongoing tobacco abuse: Patient has been  advised to quit smoking.   DISCHARGE CONDITIONS:   Fair  CONSULTS OBTAINED:  Treatment Team:  Marcina Millard, MD  DRUG ALLERGIES:  No Known Allergies  DISCHARGE MEDICATIONS:   Current Discharge Medication List    START taking these medications   Details  !! apixaban (ELIQUIS) 5 MG TABS tablet Take 2 tablets (10 mg total) by mouth 2 (two) times daily. Qty: 14 tablet, Refills: 0    !! apixaban (ELIQUIS) 5 MG TABS tablet Take 1 tablet (5 mg total) by mouth 2 (two) times daily. Qty: 60 tablet, Refills: 2    carvedilol (COREG) 3.125 MG tablet Take 1 tablet (3.125 mg total) by mouth 2 (two) times daily with a meal. Qty: 60 tablet, Refills: 0    lisinopril (PRINIVIL,ZESTRIL) 5 MG tablet Take 1 tablet (5 mg total) by mouth daily. Qty: 30 tablet, Refills: 0     !! - Potential duplicate medications found. Please discuss with provider.       DISCHARGE INSTRUCTIONS:    Discharged home. No home health needs. Prescriptions given. Education given. Low-sodium diet. Needs to find primary care and appointment has been scheduled for 11/28 2016  If you experience worsening of your admission symptoms, develop shortness of breath, life threatening emergency, suicidal or homicidal thoughts you must seek medical attention immediately by calling 911 or calling your MD immediately  if symptoms less severe.  You Must read complete instructions/literature along with all the possible adverse reactions/side effects for all the Medicines you take and that have been prescribed to you. Take any new Medicines after you have completely understood and accept all the  possible adverse reactions/side effects.   Please note  You were cared for by a hospitalist during your hospital stay. If you have any questions about your discharge medications or the care you received while you were in the hospital after you are discharged, you can call the unit and asked to speak with the hospitalist on call if the  hospitalist that took care of you is not available. Once you are discharged, your primary care physician will handle any further medical issues. Please note that NO REFILLS for any discharge medications will be authorized once you are discharged, as it is imperative that you return to your primary care physician (or establish a relationship with a primary care physician if you do not have one) for your aftercare needs so that they can reassess your need for medications and monitor your lab values.    Today   CHIEF COMPLAINT:   Chief Complaint  Patient presents with  . Shortness of Breath    HISTORY OF PRESENT ILLNESS:  Henry Schneider is a 59 y.o. male with no significant medical history who presents to our emergency room with mild chest pain for 2 weeks and acute shortness of breath. He reports that about 2 weeks ago he was helping his mother move and that night when he went to bed felt sore on the left chest. The next day when he went to bed he realized he was sore on the right chest and back. For about one week he has noted that he has been short of breath with exertion. This morning he was preparing to drive back to [place] and felt so short of breath and lightheaded that he realized he would not be able to drive. Instead he came to our emergency room. CT angiography has shown pulmonary emboli in the right upper lobe and. There are also enlarged perihilar lymph nodes and a small right-sided pleural effusion with atelectasis versus infiltrate on the right. He is being admitted for further evaluation.  VITAL SIGNS:  Blood pressure 98/73, pulse 93, temperature 97.5 F (36.4 C), temperature source Oral, resp. rate 18, height  (1.93 m), weight 127.053 kg (280 lb 1.6 oz), SpO2 96 %.  I/O:   Intake/Output Summary (Last 24 hours) at 12/20/14 1741 Last data filed at 12/20/14 1200  Gross per 24 hour  Intake    960 ml  Output    920 ml  Net     40 ml    PHYSICAL EXAMINATION:  GENERAL:  59  y.o.-year-old patient lying in the bed with no acute distress.  EYES: Pupils equal, round, reactive to light and accommodation. No scleral icterus. Extraocular muscles intact.  HEENT: Head atraumatic, normocephalic. Oropharynx and nasopharynx clear.  NECK:  Supple, no jugular venous distention. No thyroid enlargement, no tenderness.  LUNGS: Normal breath sounds bilaterally, no wheezing, rales,rhonchi or crepitation. No use of accessory muscles of respiration.  CARDIOVASCULAR: S1, S2 normal. No murmurs, rubs, or gallops.  ABDOMEN: Soft, non-tender, non-distended. Bowel sounds present. No organomegaly or mass.  EXTREMITIES: No pedal edema, cyanosis, or clubbing.  NEUROLOGIC: Cranial nerves II through XII are intact. Muscle strength 5/5 in all extremities. Sensation intact. Gait not checked.  PSYCHIATRIC: The patient is alert and oriented x 3.  SKIN: No obvious rash, lesion, or ulcer.   DATA REVIEW:   CBC  Recent Labs Lab 12/20/14 0828  WBC 9.7  HGB 15.3  HCT 45.1  PLT 266    Chemistries   Recent Labs Lab 12/19/14 1208  12/20/14 0235  NA  --  140  K  --  4.2  CL  --  110  CO2  --  21*  GLUCOSE  --  101*  BUN  --  19  CREATININE  --  0.89  CALCIUM  --  9.2  AST 16  --   ALT 17  --   ALKPHOS 62  --   BILITOT 0.7  --     Cardiac Enzymes  Recent Labs Lab 12/19/14 2141  TROPONINI 0.04*    Microbiology Results  No results found for this or any previous visit.  RADIOLOGY:  Ct Angio Chest Pe W/cm &/or Wo Cm  12/19/2014  CLINICAL DATA:  Chest pain, dyspnea EXAM: CT ANGIOGRAPHY CHEST WITH CONTRAST TECHNIQUE: Multidetector CT imaging of the chest was performed using the standard protocol during bolus administration of intravenous contrast. Multiplanar CT image reconstructions and MIPs were obtained to evaluate the vascular anatomy. CONTRAST:  OMNIPAQUE IOHEXOL 350 MG/ML SOLN COMPARISON:  None. FINDINGS: Images of the thoracic inlet are unremarkable. Central airways  are patent. No mediastinal hematoma or adenopathy. The study is of excellent technical quality. Borderline cardiomegaly. There is small right pleural effusion with right basilar atelectasis or infiltrate. A right hilar lymph node measures 1.4 by 1.1 cm borderline by size criteria. Left hilar lymph node measures 1.6 by 1.2 cm borderline enlarged. Axial image 60 there is pulmonary embolus noted in right upper lobe lobar branch extending in 2 segmental branches axial image 55. Additional pulmonary embolus noted in axial image 40 right upper lobe. The right ventricle over left ventricle ratio is 0.8 without significant right heart strain. There is patchy atelectasis or infiltrate in right base anteriorly. Bilateral perihilar bronchitic changes. The visualized upper abdomen shows mild thickening of left adrenal gland without definite evidence of mass. No calcified gallstones are noted within gallbladder. There is no intrahepatic biliary ductal dilatation. Review of the MIP images confirms the above findings. IMPRESSION: 1. Right pulmonary emboli as described above. 2. Bilateral perihilar bronchitic changes. Borderline enlarged bilateral hilar lymph nodes probable reactive. 3. There is small right pleural effusion with right lower lobe atelectasis or infiltrate. 4. The ratio right ventricle over left ventricle is 0.8 without significant right heart strain. These results were called by telephone at the time of interpretation on 12/19/2014 at 9:24 am to Dr. Daryel November , who verbally acknowledged these results. Electronically Signed   By: Natasha Mead M.D.   On: 12/19/2014 09:25   US Venous Img Lower Bilateral  12/19/2014  CLINICAL DATA:  59 year old male with history of pulmonary emboli an acute onset of shortness of breath with cough, chest pain and dyspnea. Shortness of breath for 1 week. EXAM: BILATERAL LOWER EXTREMITY VENOUS DOPPLER ULTRASOUND TECHNIQUE: Gray-scale sonography with graded compression, as well as  color Doppler and duplex ultrasound were performed to evaluate the lower extremity deep venous systems from the level of the common femoral vein and including the common femoral, femoral, profunda femoral, popliteal and calf veins including the posterior tibial, peroneal and gastrocnemius veins when visible. The superficial great saphenous vein was also interrogated. Spectral Doppler was utilized to evaluate flow at rest and with distal augmentation maneuvers in the common femoral, femoral and popliteal veins. COMPARISON:  None. FINDINGS: RIGHT LOWER EXTREMITY Common Femoral Vein: No evidence of thrombus. Normal compressibility, respiratory phasicity and response to augmentation. Saphenofemoral Junction: No evidence of thrombus. Normal compressibility and flow on color Doppler imaging. Profunda Femoral Vein: No evidence of thrombus.  Normal compressibility and flow on color Doppler imaging. Femoral Vein: No evidence of thrombus. Normal compressibility, respiratory phasicity and response to augmentation. Popliteal Vein: No evidence of thrombus. Normal compressibility, respiratory phasicity and response to augmentation. Calf Veins: Occlusive thrombus in the right gastrocnemius vein. Superficial Great Saphenous Vein: No evidence of thrombus. Normal compressibility and flow on color Doppler imaging. Venous Reflux:  None. Other Findings:  None. LEFT LOWER EXTREMITY Common Femoral Vein: No evidence of thrombus. Normal compressibility, respiratory phasicity and response to augmentation. Saphenofemoral Junction: No evidence of thrombus. Normal compressibility and flow on color Doppler imaging. Profunda Femoral Vein: No evidence of thrombus. Normal compressibility and flow on color Doppler imaging. Femoral Vein: No evidence of thrombus. Normal compressibility, respiratory phasicity and response to augmentation. Popliteal Vein: No evidence of thrombus. Normal compressibility, respiratory phasicity and response to augmentation.  Calf Veins: No evidence of thrombus. Normal compressibility and flow on color Doppler imaging. Superficial Great Saphenous Vein: No evidence of thrombus. Normal compressibility and flow on color Doppler imaging. Venous Reflux:  None. Other Findings:  None. IMPRESSION: 1. Study is positive for occlusive thrombus in the right gastrocnemius vein. 2. No evidence of deep venous thrombosis in the left lower extremity. Electronically Signed   By: Trudie Reedaniel  Entrikin M.D.   On: 12/19/2014 10:46   Dg Chest Portable 1 View  12/19/2014  CLINICAL DATA:  Acute onset of shortness of breath and cough. Initial encounter. EXAM: PORTABLE CHEST 1 VIEW COMPARISON:  None. FINDINGS: The lungs are well-aerated. Right basilar airspace opacification is compatible with pneumonia. Pulmonary vascularity is at the upper limits of normal. There is no evidence of pleural effusion or pneumothorax. The cardiomediastinal silhouette is borderline normal in size. No acute osseous abnormalities are seen. IMPRESSION: Right basilar pneumonia noted. Electronically Signed   By: Roanna RaiderJeffery  Chang M.D.   On: 12/19/2014 06:54    EKG:   Orders placed or performed during the hospital encounter of 12/19/14  . ED EKG  . ED EKG  . EKG 12-Lead  . EKG 12-Lead      Management plans discussed with the patient, family and they are in agreement.  CODE STATUS:     Code Status Orders        Start     Ordered   12/19/14 1049  Full code   Continuous     12/19/14 1049      TOTAL TIME TAKING CARE OF THIS PATIENT: 35 minutes.  Greater than 50% of time spent in care coordination and counseling.  Elby ShowersWALSH, Aerie Donica M.D on 12/20/2014 at 5:41 PM  Between 7am to 6pm - Pager - [phone]  After 6pm go to [email or web address] - password [email or web address] [facility]  [facility]  Office  [phone]  CC: Primary care physician; No PCP Per Patient

## 2014-12-28 LAB — PTT-LA MIX: PTT-LA Mix: 83.6 s — ABNORMAL HIGH (ref 0.0–40.6)

## 2014-12-28 LAB — CARDIOLIPIN ANTIBODIES, IGG, IGM, IGA
Anticardiolipin IgA: <9 APL U/mL (ref 0–11)
Anticardiolipin IgG: <9 GPL U/mL (ref 0–14)

## 2014-12-28 LAB — PROTEIN C, TOTAL: PROTEIN C, TOTAL: 76 % (ref 60–150)

## 2014-12-28 LAB — PROTEIN C ACTIVITY: PROTEIN C ACTIVITY: 95 % (ref 73–180)

## 2014-12-28 LAB — BETA-2-GLYCOPROTEIN I ABS, IGG/M/A: Beta-2 Glyco I IgG: <9 GPI IgG units (ref 0–20)

## 2014-12-28 LAB — DRVVT MIX: dRVVT Mix: 47 s — ABNORMAL HIGH (ref 0.0–44.0)

## 2014-12-28 LAB — LUPUS ANTICOAGULANT PANEL
DRVVT: 63.6 s — AB (ref 0.0–44.0)
PTT Lupus Anticoagulant: 105.8 s — ABNORMAL HIGH (ref 0.0–40.6)

## 2014-12-28 LAB — PROTHROMBIN GENE MUTATION

## 2014-12-28 LAB — FACTOR 5 LEIDEN

## 2014-12-28 LAB — DRVVT CONFIRM: DRVVT CONFIRM: 1.2 ratio (ref 0.8–1.2)

## 2014-12-28 LAB — PROTEIN S ACTIVITY: PROTEIN S ACTIVITY: 96 % (ref 63–140)

## 2014-12-28 LAB — PROTEIN S, TOTAL: Protein S Ag, Total: 126 % (ref 60–150)

## 2014-12-28 LAB — HEXAGONAL PHASE PHOSPHOLIPID: Hexagonal Phase Phospholipid: 19 s — ABNORMAL HIGH (ref 0–11)

## 2014-12-28 LAB — HOMOCYSTEINE: HOMOCYSTEINE-NORM: 7.9 umol/L (ref 0.0–15.0)

## 2016-04-14 IMAGING — CR DG CHEST 1V PORT
1 series · 1 of 1 positions shown · non-contrast
Comparison: None.

CLINICAL DATA: Acute onset of shortness of breath and cough.
Initial encounter.

EXAM:
PORTABLE CHEST 1 VIEW

[ap]
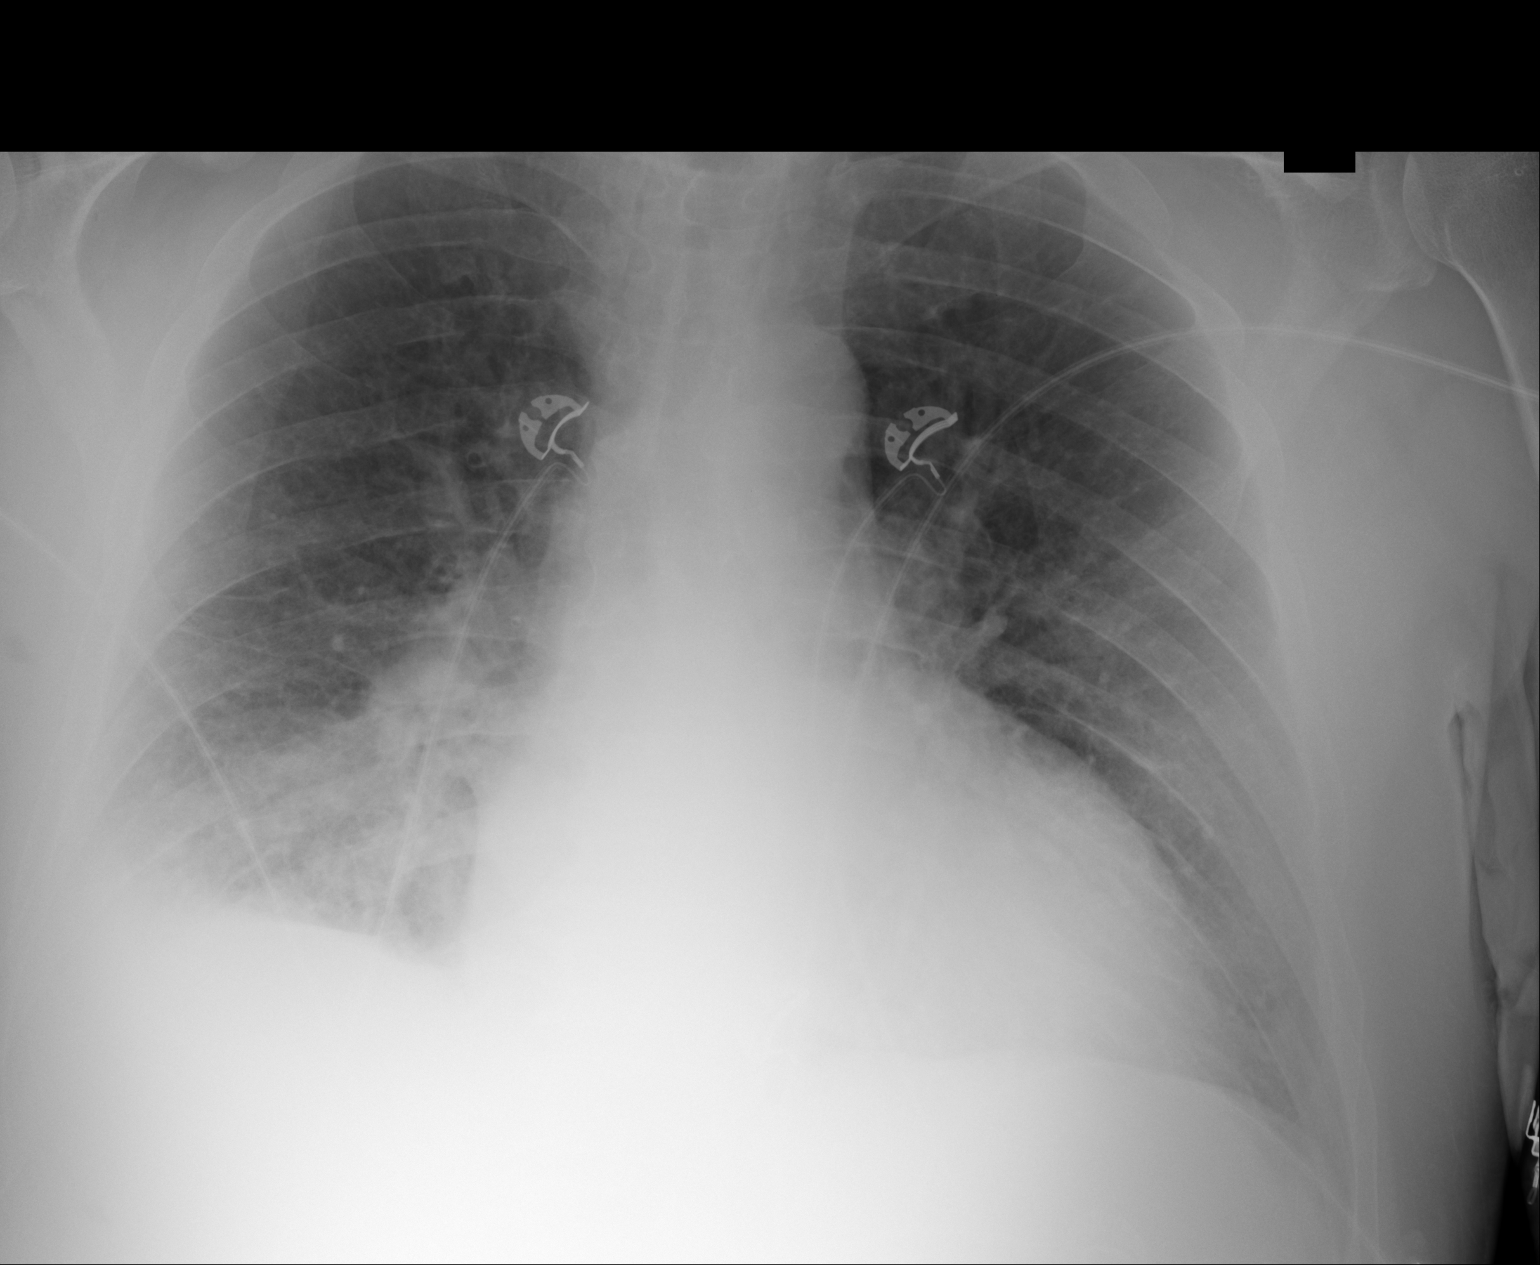

[1 of 1 positions shown; findings below may reference images not displayed]

FINDINGS: The lungs are well-aerated. Right basilar airspace opacification is
compatible with pneumonia. Pulmonary vascularity is at the upper
limits of normal. There is no evidence of pleural effusion or
pneumothorax.

The cardiomediastinal silhouette is borderline normal in size. No
acute osseous abnormalities are seen.
IMPRESSION: Right basilar pneumonia noted.

## 2016-04-14 IMAGING — CT CT ANGIO CHEST
2 of 6 series · 18 of 46 positions shown · IV contrast (APPLIED)
Comparison: None.

CLINICAL DATA: Chest pain, dyspnea

EXAM:
CT ANGIOGRAPHY CHEST WITH CONTRAST
TECHNIQUE: Multidetector CT imaging of the chest was performed using the
standard protocol during bolus administration of intravenous
contrast. Multiplanar CT image reconstructions and MIPs were
obtained to evaluate the vascular anatomy.
CONTRAST:  100mL OMNIPAQUE IOHEXOL 350 MG/ML SOLN

[Series 5: pe thins 1.5 · axial · 0.68mm/px · z∈[-781,-457]mm · 15 of 302 slices shown]
[im 16/302  lung]
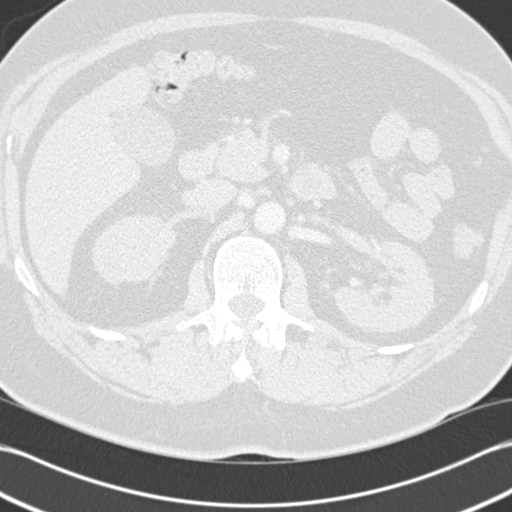
[im 32/302  soft-tissue]
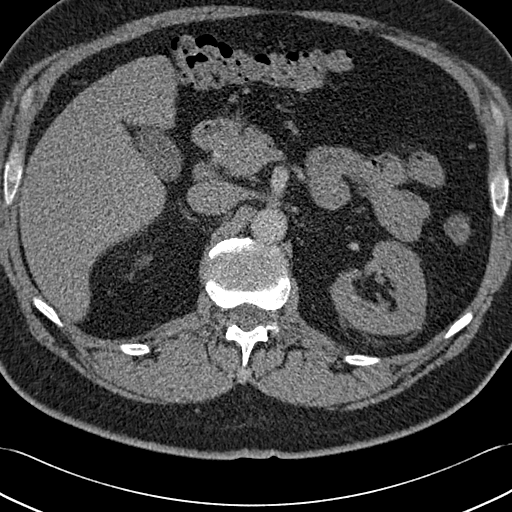
[im 64/302  lung]
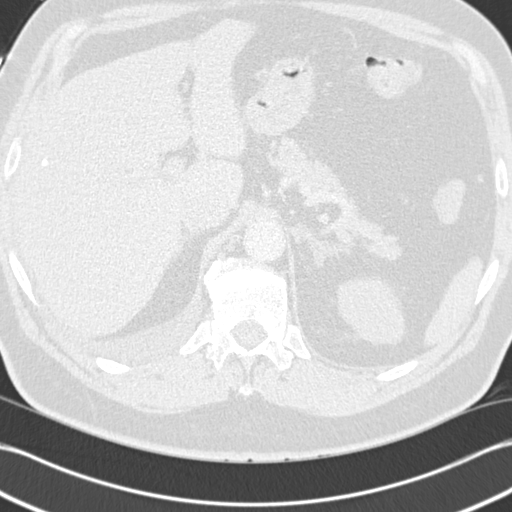
[im 80/302  soft-tissue]
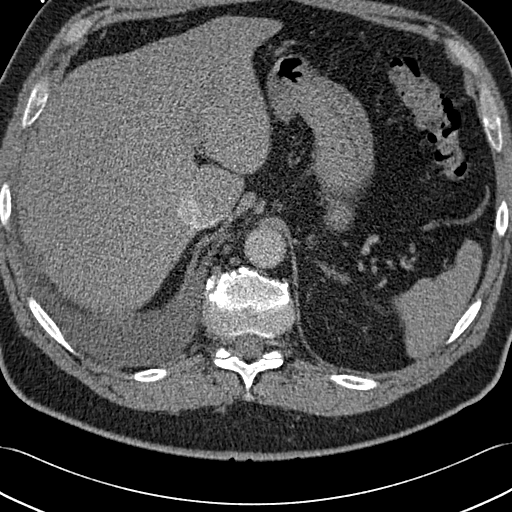
[im 96/302  lung]
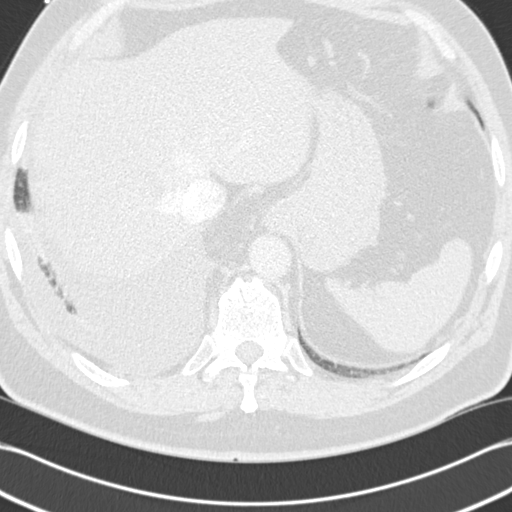
[im 111/302  soft-tissue]
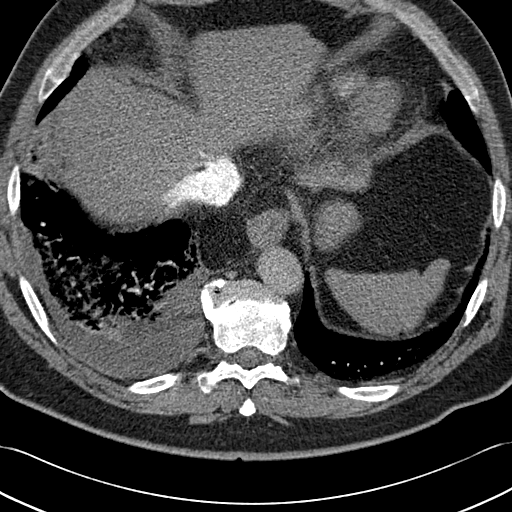
[im 127/302  lung]
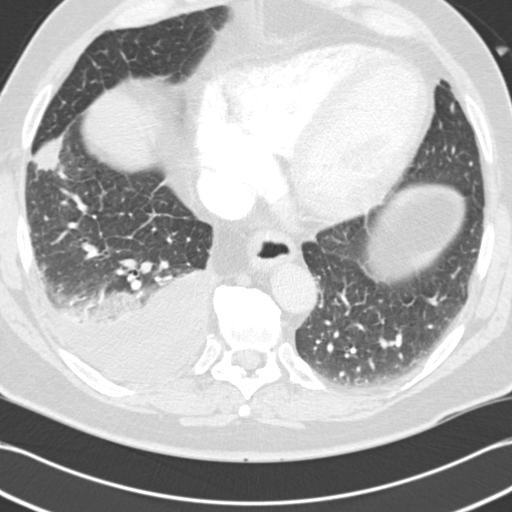
[im 159/302  soft-tissue]
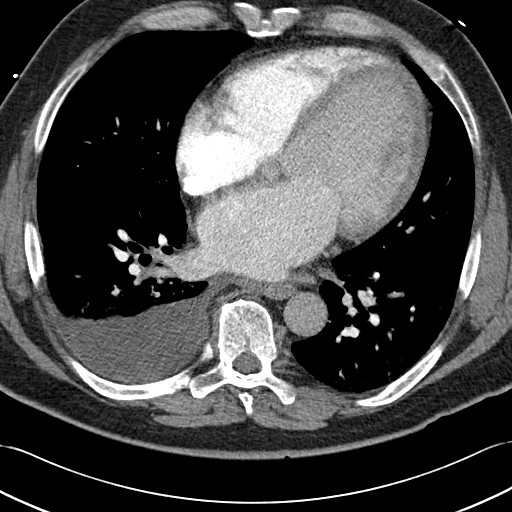
[im 175/302  lung]
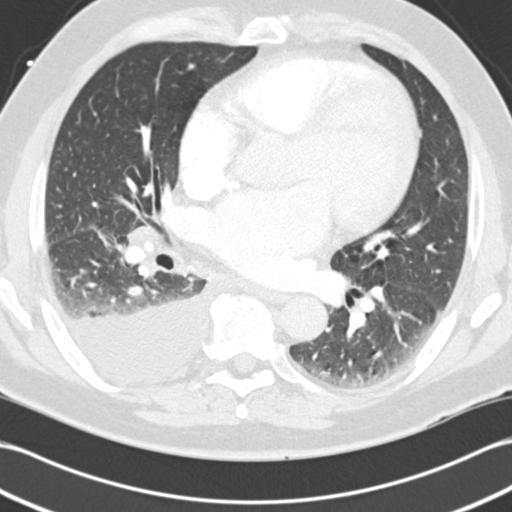
[im 191/302  soft-tissue]
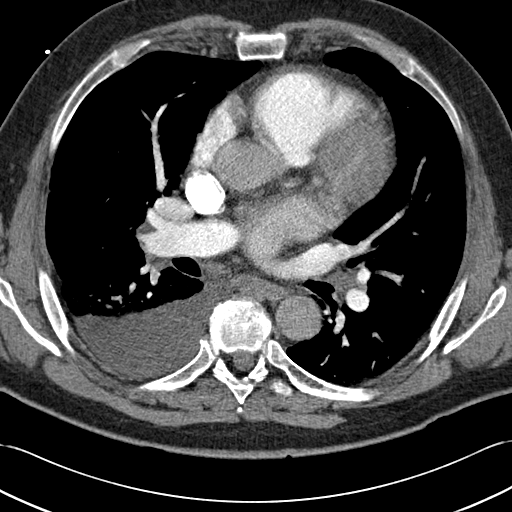
[im 206/302  lung]
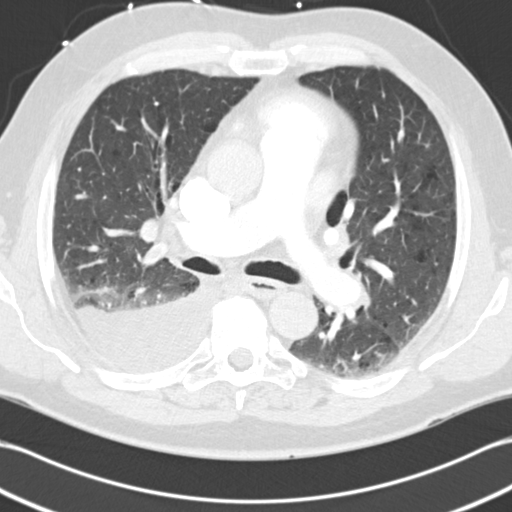
[im 222/302  soft-tissue]
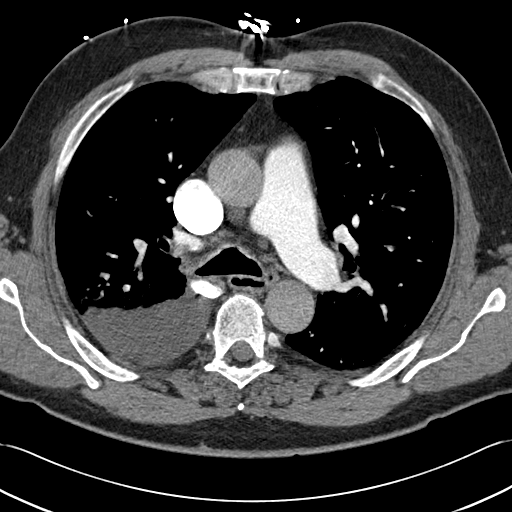
[im 254/302  lung]
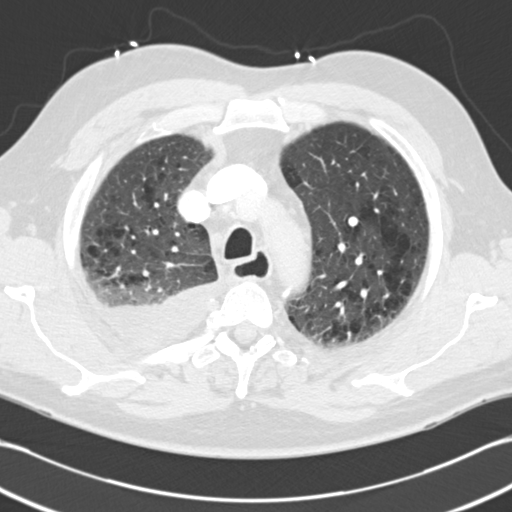
[im 270/302  soft-tissue]
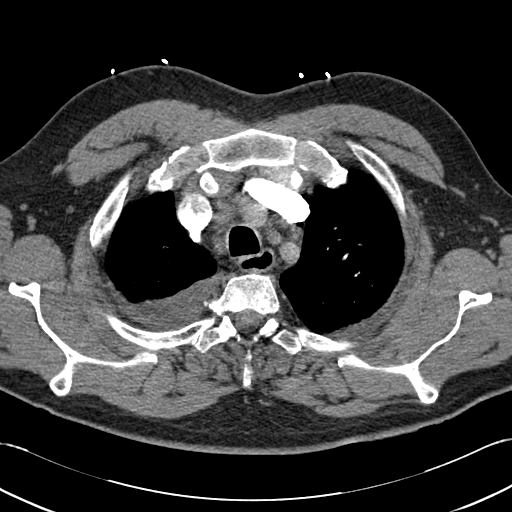
[im 286/302  lung]
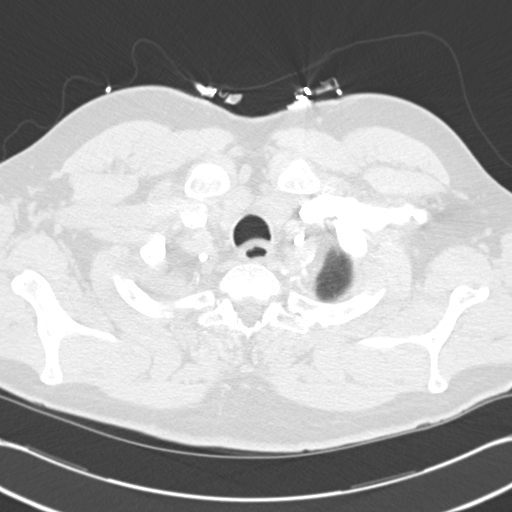

[Series 7: cor mpr 2.0 · coronal · 0.79mm/px · 3 of 174 slices shown]
[im 44/174  soft-tissue]
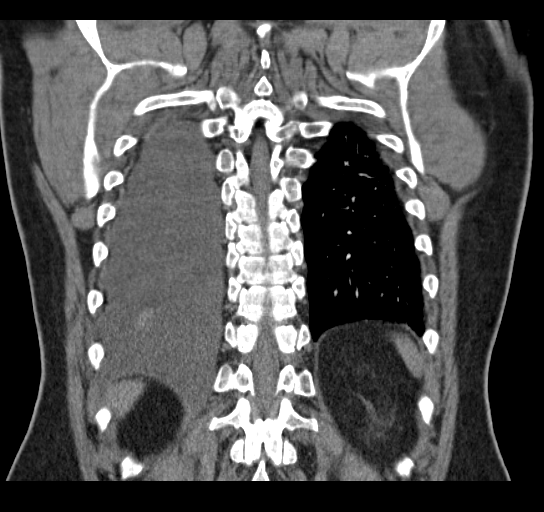
[im 87/174  soft-tissue]
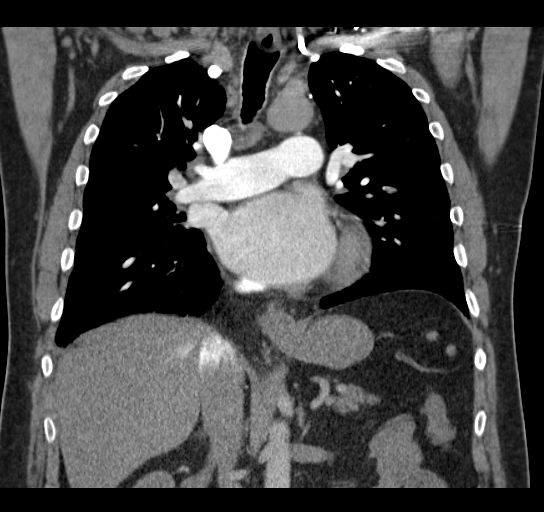
[im 130/174  soft-tissue]
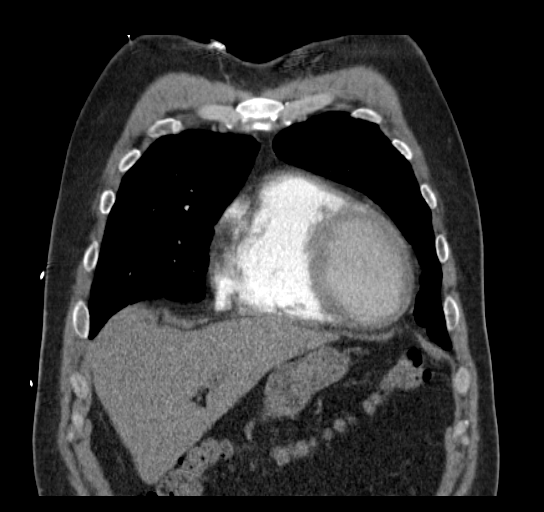

[18 of 46 positions shown; findings below may reference images not displayed]

FINDINGS: Images of the thoracic inlet are unremarkable. Central airways are
patent. No mediastinal hematoma or adenopathy. The study is of
excellent technical quality. Borderline cardiomegaly. There is small
right pleural effusion with right basilar atelectasis or infiltrate.
A right hilar lymph node measures 1.4 by 1.1 cm borderline by size
criteria. Left hilar lymph node measures 1.6 by 1.2 cm borderline
enlarged.

Axial image 60 there is pulmonary embolus noted in right upper lobe
lobar branch extending in 2 segmental branches axial image 55.
Additional pulmonary embolus noted in axial image 40 right upper
lobe. The right ventricle over left ventricle ratio is 0.8 without
significant right heart strain. There is patchy atelectasis or
infiltrate in right base anteriorly. Bilateral perihilar bronchitic
changes.

The visualized upper abdomen shows mild thickening of left adrenal
gland without definite evidence of mass. No calcified gallstones are
noted within gallbladder. There is no intrahepatic biliary ductal
dilatation.

Review of the MIP images confirms the above findings.
IMPRESSION: 1. Right pulmonary emboli as described above.
2. Bilateral perihilar bronchitic changes. Borderline enlarged
bilateral hilar lymph nodes probable reactive.
3. There is small right pleural effusion with right lower lobe
atelectasis or infiltrate.
4. The ratio right ventricle over left ventricle is 0.8 without
significant right heart strain.
These results were called by telephone at the time of interpretation
on 12/19/2014 at [DATE] to Dr. JULLON LIENAD , who verbally
acknowledged these results.

## 2017-08-11 DIAGNOSIS — E66812 Obesity, class 2: Secondary | ICD-10-CM | POA: Insufficient documentation

## 2023-07-11 ENCOUNTER — Emergency Department
Admission: EM | Admit: 2023-07-11 | Discharge: 2023-07-12 | Disposition: A | Discharge status: Other Acute Inpatient Hospital | Attending: Emergency Medicine | Admitting: Emergency Medicine

## 2023-07-11 ENCOUNTER — Emergency Department

## 2023-07-11 ENCOUNTER — Other Ambulatory Visit: Payer: Self-pay

## 2023-07-11 DIAGNOSIS — I5020 Unspecified systolic (congestive) heart failure: Secondary | ICD-10-CM | POA: Diagnosis not present

## 2023-07-11 DIAGNOSIS — R29810 Facial weakness: Secondary | ICD-10-CM | POA: Diagnosis not present

## 2023-07-11 DIAGNOSIS — R4701 Aphasia: Secondary | ICD-10-CM | POA: Diagnosis not present

## 2023-07-11 DIAGNOSIS — I639 Cerebral infarction, unspecified: Secondary | ICD-10-CM | POA: Diagnosis not present

## 2023-07-11 DIAGNOSIS — I6389 Other cerebral infarction: Secondary | ICD-10-CM | POA: Diagnosis not present

## 2023-07-11 DIAGNOSIS — R4182 Altered mental status, unspecified: Secondary | ICD-10-CM | POA: Diagnosis present

## 2023-07-11 DIAGNOSIS — I63312 Cerebral infarction due to thrombosis of left middle cerebral artery: Secondary | ICD-10-CM | POA: Diagnosis not present

## 2023-07-11 LAB — COMPREHENSIVE METABOLIC PANEL WITH GFR
ALT: 24 U/L (ref 0–44)
AST: 20 U/L (ref 15–41)
Albumin: 3.9 g/dL (ref 3.5–5.0)
Alkaline Phosphatase: 60 U/L (ref 38–126)
Anion gap: 7 (ref 5–15)
BUN: 27 mg/dL — ABNORMAL HIGH (ref 8–23)
CO2: 21 mmol/L — ABNORMAL LOW (ref 22–32)
Calcium: 9 mg/dL (ref 8.9–10.3)
Chloride: 110 mmol/L (ref 98–111)
Creatinine, Ser: 1.13 mg/dL (ref 0.61–1.24)
GFR, Estimated: >60 mL/min (ref >60)
Glucose, Bld: 106 mg/dL — ABNORMAL HIGH (ref 70–99)
Potassium: 4.3 mmol/L (ref 3.5–5.1)
Sodium: 138 mmol/L (ref 135–145)
Total Bilirubin: 1.3 mg/dL — ABNORMAL HIGH (ref 0.0–1.2)
Total Protein: 6.9 g/dL (ref 6.5–8.1)

## 2023-07-11 LAB — DIFFERENTIAL
Abs Immature Granulocytes: 0.06 10*3/uL (ref 0.00–0.07)
Basophils Absolute: 0.1 10*3/uL (ref 0.0–0.1)
Basophils Relative: 0 %
Eosinophils Absolute: 0.2 10*3/uL (ref 0.0–0.5)
Eosinophils Relative: 2 %
Immature Granulocytes: 0 %
Lymphocytes Relative: 23 %
Lymphs Abs: 3.2 10*3/uL (ref 0.7–4.0)
Monocytes Absolute: 1.1 10*3/uL — ABNORMAL HIGH (ref 0.1–1.0)
Monocytes Relative: 8 %
Neutro Abs: 9.2 10*3/uL — ABNORMAL HIGH (ref 1.7–7.7)
Neutrophils Relative %: 67 %

## 2023-07-11 LAB — CBC
HCT: 49.9 % (ref 39.0–52.0)
Hemoglobin: 16.5 g/dL (ref 13.0–17.0)
MCH: 31.5 pg (ref 26.0–34.0)
MCHC: 33.1 g/dL (ref 30.0–36.0)
MCV: 95.2 fL (ref 80.0–100.0)
Platelets: 189 10*3/uL (ref 150–400)
RBC: 5.24 MIL/uL (ref 4.22–5.81)
RDW: 13.2 % (ref 11.5–15.5)
WBC: 13.9 10*3/uL — ABNORMAL HIGH (ref 4.0–10.5)
nRBC: 0 % (ref 0.0–0.2)

## 2023-07-11 LAB — ETHANOL: Alcohol, Ethyl (B): <15 mg/dL (ref <15)

## 2023-07-11 LAB — APTT: aPTT: 27 s (ref 24–36)

## 2023-07-11 LAB — PROTIME-INR
INR: 1.1 (ref 0.8–1.2)
Prothrombin Time: 14.1 s (ref 11.4–15.2)

## 2023-07-11 LAB — CBG MONITORING, ED: Glucose-Capillary: 112 mg/dL — ABNORMAL HIGH (ref 70–99)

## 2023-07-11 MED ORDER — SODIUM CHLORIDE 0.9% FLUSH
3.0000 mL | Freq: Once | INTRAVENOUS | Status: AC
  Administered 2023-07-11: 3 mL via INTRAVENOUS

## 2023-07-11 MED ORDER — IOHEXOL 350 MG/ML SOLN
125.0000 mL | Freq: Once | INTRAVENOUS | Status: AC | PRN
Start: 2023-07-11 — End: 2023-07-11
  Administered 2023-07-11: 125 mL via INTRAVENOUS

## 2023-07-11 NOTE — ED Notes (Addendum)
Tele neuro bedside

## 2023-07-11 NOTE — ED Triage Notes (Signed)
 Pt presents to ER not feeling well pt unable to tell name or DOB, only able to reply 10/16 unsure last well known. Unsure baseline

## 2023-07-11 NOTE — ED Provider Notes (Signed)
 [facility] Provider Note    Event Date/Time   First MD Initiated Contact with Patient 07/11/23 2245     (approximate)   History   Altered Mental Status   HPI  Henry Schneider is a 68 year old male presenting to the emergency department for evaluation of altered mental status.  Patient walked in to the ER lobby and said something is wrong.  He was unable to provide further details and was noted to have significant confusion.  I was called to triage to perform a stroke assessment.  On my exam, patient was noted to able to speak in sentences without clear dysarthria, but often would not be able to finish them.  He seemed to have a significant receptive aphasia.  No family present to provide collateral history.  Additional history was able to be obtained from patient's brother listed in his chart later who noted that he lives in [place]  and has not heard from the patient in a few days, but no history of stroke, speech difficulties.     Physical Exam   Triage Vital Signs: ED Triage Vitals  Encounter Vitals Group     BP 07/11/23 2233 (!) 154/87     Girls Systolic BP Percentile --      Girls Diastolic BP Percentile --      Boys Systolic BP Percentile --      Boys Diastolic BP Percentile --      Pulse Rate 07/11/23 2233 76     Resp 07/11/23 2233 16     Temp 07/11/23 2233 98.7 F (37.1 C)     Temp Source 07/11/23 2233 Oral     SpO2 07/11/23 2233 95 %     Weight --      Height --      Head Circumference --      Peak Flow --      Pain Score 07/11/23 2238 0     Pain Loc --      Pain Education --      Exclude from Growth Chart --     Most recent vital signs: Vitals:   07/11/23 2233  BP: (!) 154/87  Pulse: 76  Resp: 16  Temp: 98.7 F (37.1 C)  SpO2: 95%     General: Awake, interactive  CV:  Regular rate, good peripheral perfusion.  Resp:  Unlabored respirations Abd:  Nondistended.  Neuro:  Keenly aware, unable to answer month or age,  unable to follow verbal commands, does appear to follow commands when demonstrated in front of patient without obvious appreciable weakness though difficult exam in the setting of his receptive aphasia, extraocular movements grossly intact.  No appreciable field cut.  Left facial droop present.  ED Results / Procedures / Treatments   Labs (all labs ordered are listed, but only abnormal results are displayed) Labs Reviewed  CBC - Abnormal; Notable for the following components:      Result Value   WBC 13.9 (*)    All other components within normal limits  DIFFERENTIAL - Abnormal; Notable for the following components:   Neutro Abs 9.2 (*)    Monocytes Absolute 1.1 (*)    All other components within normal limits  COMPREHENSIVE METABOLIC PANEL WITH GFR - Abnormal; Notable for the following components:   CO2 21 (*)    Glucose, Bld 106 (*)    BUN 27 (*)    Total Bilirubin 1.3 (*)    All other components within normal limits  CBG MONITORING,  ED - Abnormal; Notable for the following components:   Glucose-Capillary 112 (*)    All other components within normal limits  PROTIME-INR  APTT  ETHANOL  I-STAT CREATININE, ED  CBG MONITORING, ED     EKG EKG independently reviewed and interpreted by myself demonstrates:    RADIOLOGY Imaging independently reviewed and interpreted by myself demonstrates: CT head without acute bleed CT perfusion study pending for formal read, but notified by radiology that this demonstrated a left MCA territory infarct with 20 mm core infarct, 74 mm penumbra with an acute M3 stump occlusion.  Per my discussion with radiologist, does strongly suspect that this is an acute occlusion with onset under 24 hours.  Formal Radiology Read:  CT HEAD CODE STROKE WO CONTRAST` Result Date: 07/11/2023 CLINICAL DATA:  Initial EXAM: CT ANGIOGRAPHY HEAD AND NECK CT PERFUSION BRAIN TECHNIQUE: Multidetector CT imaging of the head and neck was performed using the standard protocol  during bolus administration of intravenous contrast. Multiplanar CT image reconstructions and MIPs were obtained to evaluate the vascular anatomy. Carotid stenosis measurements (when applicable) are obtained utilizing NASCET criteria, using the distal internal carotid diameter as the denominator. Multiphase CT imaging of the brain was performed following IV bolus contrast injection. Subsequent parametric perfusion maps were calculated using RAPID software. RADIATION DOSE REDUCTION: This exam was performed according to the departmental dose-optimization program which includes automated exposure control, adjustment of the mA and/or kV according to patient size and/or use of iterative reconstruction technique. CONTRAST:  OMNIPAQUE  IOHEXOL  350 MG/ML SOLN COMPARISON:  None available. FINDINGS: CT HEAD FINDINGS Brain: Evolving cytotoxic edema involving the left insula and posterior left cerebral hemisphere, consistent with an evolving left MCA distribution infarct. Mild patchy involvement of the anterior left frontal lobe as well. Sparing of the basal ganglia. No significant mass effect or midline shift. No hydrocephalus. No acute intracranial hemorrhage. No extra-axial fluid collection. Vascular: Abnormal hyperdensity involving proximal left MCA branch, into and/or M3 segment, concerning for thrombus (series 4, image 13). Skull: Scalp soft tissues within normal limits.  Calvarium intact. Sinuses/Orbits: Globes orbital soft tissues within normal limits. Chronic mucosal thickening present about the right sphenoid sinus. Paranasal sinuses are otherwise clear. No mastoid effusion. Other: None. ASPECTS (Alberta Stroke Program Early CT Score) - Ganglionic level infarction (caudate, lentiform nuclei, internal capsule, insula, M1-M3 cortex): 6 - Supraganglionic infarction (M4-M6 cortex): 1 Total score (0-10 with 10 being normal): 7 Review of the MIP images confirms the above findings CTA NECK FINDINGS Aortic arch:  Visualized aortic arch within normal limits for caliber standard branch pattern. Mild aortic atherosclerosis. No significant stenosis about the origin of the great vessels. Right carotid system: Right common and internal carotid arteries are patent without stenosis or dissection. Mild for age atheromatous change about the right carotid bulb without stenosis. Left carotid system: Left common and internal carotid arteries are patent without stenosis or dissection. Vertebral arteries: Both vertebral arteries arise from subclavian arteries. Vertebral arteries patent without significant stenosis or dissection. Skeleton: No worrisome osseous lesions. Other neck: No other acute finding. 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 Emphysema noted. Coronary artery calcifications noted as well. Review of the MIP images confirms the above findings CTA HEAD FINDINGS Anterior circulation: Mild atheromatous change about the carotid siphons without hemodynamically significant stenosis. A1 segments patent bilaterally. Normal anterior communicating artery complex. Anterior cerebral arteries are patent without stenosis. No M1 stenosis or occlusion. Right MCA branches widely patent and well perfused. On the left, there is an acute  proximal M3 stump occlusion, inferior division (series 9, image 137). Diminished arborization within the left MCA territory. Posterior circulation: Both V4 segments patent without significant stenosis. Neither PICA origin well visualized. Basilar patent without stenosis. Superior cerebellar and posterior cerebral arteries patent bilaterally. Venous sinuses: Bowel assessed due to timing the contrast bolus. Anatomic variants: None significant.  No aneurysm. Review of the MIP images confirms the above findings CT Brain Perfusion Findings: ASPECTS: 7 CBF (<30%) Volume: 20mL Perfusion (Tmax>6.0s) volume: 94mL Mismatch Volume: 74mL Infarction Location:Acute core infarct involving the posterior left  frontoparietal region, posterior left MCA territory. 74 mL surrounding ischemic penumbra. IMPRESSION: CT HEAD: 1. Evolving cytotoxic edema involving the left insula and overlying left cerebral hemisphere, consistent with an evolving acute left MCA distribution infarct. No acute intracranial hemorrhage. 2. Aspects = 7. 3. Abnormal hyperdensity involving a proximal left MCA branch, concerning for thrombus. CTA HEAD AND NECK: 1. Acute proximal M3 stump occlusion, inferior division left MCA. 2. Otherwise negative CTA for large vessel occlusion or other emergent finding. 3. Mild for age atheromatous change about the carotid bifurcations and carotid siphons without hemodynamically significant stenosis. 4. Aortic Atherosclerosis (ICD10-I70.0) and Emphysema (ICD10-J43.9). CT PERFUSION: 20 mL acute core infarct involving the posterior left frontoparietal region, posterior left MCA territory. 74 mL surrounding ischemic penumbra. Critical Value/emergent results were called by telephone at the time of interpretation on 07/11/2023 at 11:01 pm to provider Wray Goehring , who verbally acknowledged these results. Electronically Signed   By: Virgia Griffins M.D.   On: 07/11/2023 23:17   CT ANGIO HEAD NECK W WO CM W PERF (CODE STROKE) Result Date: 07/11/2023 CLINICAL DATA:  Initial EXAM: CT ANGIOGRAPHY HEAD AND NECK CT PERFUSION BRAIN TECHNIQUE: Multidetector CT imaging of the head and neck was performed using the standard protocol during bolus administration of intravenous contrast. Multiplanar CT image reconstructions and MIPs were obtained to evaluate the vascular anatomy. Carotid stenosis measurements (when applicable) are obtained utilizing NASCET criteria, using the distal internal carotid diameter as the denominator. Multiphase CT imaging of the brain was performed following IV bolus contrast injection. Subsequent parametric perfusion maps were calculated using RAPID software. RADIATION DOSE REDUCTION: This exam was  performed according to the departmental dose-optimization program which includes automated exposure control, adjustment of the mA and/or kV according to patient size and/or use of iterative reconstruction technique. CONTRAST:  OMNIPAQUE  IOHEXOL  350 MG/ML SOLN COMPARISON:  None available. FINDINGS: CT HEAD FINDINGS Brain: Evolving cytotoxic edema involving the left insula and posterior left cerebral hemisphere, consistent with an evolving left MCA distribution infarct. Mild patchy involvement of the anterior left frontal lobe as well. Sparing of the basal ganglia. No significant mass effect or midline shift. No hydrocephalus. No acute intracranial hemorrhage. No extra-axial fluid collection. Vascular: Abnormal hyperdensity involving proximal left MCA branch, into and/or M3 segment, concerning for thrombus (series 4, image 13). Skull: Scalp soft tissues within normal limits.  Calvarium intact. Sinuses/Orbits: Globes orbital soft tissues within normal limits. Chronic mucosal thickening present about the right sphenoid sinus. Paranasal sinuses are otherwise clear. No mastoid effusion. Other: None. ASPECTS (Alberta Stroke Program Early CT Score) - Ganglionic level infarction (caudate, lentiform nuclei, internal capsule, insula, M1-M3 cortex): 6 - Supraganglionic infarction (M4-M6 cortex): 1 Total score (0-10 with 10 being normal): 7 Review of the MIP images confirms the above findings CTA NECK FINDINGS Aortic arch: Visualized aortic arch within normal limits for caliber standard branch pattern. Mild aortic atherosclerosis. No significant stenosis about the origin of the great  vessels. Right carotid system: Right common and internal carotid arteries are patent without stenosis or dissection. Mild for age atheromatous change about the right carotid bulb without stenosis. Left carotid system: Left common and internal carotid arteries are patent without stenosis or dissection. Vertebral arteries: Both vertebral  arteries arise from subclavian arteries. Vertebral arteries patent without significant stenosis or dissection. Skeleton: No worrisome osseous lesions. Other neck: No other acute finding. 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 Emphysema noted. Coronary artery calcifications noted as well. Review of the MIP images confirms the above findings CTA HEAD FINDINGS Anterior circulation: Mild atheromatous change about the carotid siphons without hemodynamically significant stenosis. A1 segments patent bilaterally. Normal anterior communicating artery complex. Anterior cerebral arteries are patent without stenosis. No M1 stenosis or occlusion. Right MCA branches widely patent and well perfused. On the left, there is an acute proximal M3 stump occlusion, inferior division (series 9, image 137). Diminished arborization within the left MCA territory. Posterior circulation: Both V4 segments patent without significant stenosis. Neither PICA origin well visualized. Basilar patent without stenosis. Superior cerebellar and posterior cerebral arteries patent bilaterally. Venous sinuses: Bowel assessed due to timing the contrast bolus. Anatomic variants: None significant.  No aneurysm. Review of the MIP images confirms the above findings CT Brain Perfusion Findings: ASPECTS: 7 CBF (<30%) Volume: 20mL Perfusion (Tmax>6.0s) volume: 94mL Mismatch Volume: 74mL Infarction Location:Acute core infarct involving the posterior left frontoparietal region, posterior left MCA territory. 74 mL surrounding ischemic penumbra. IMPRESSION: CT HEAD: 1. Evolving cytotoxic edema involving the left insula and overlying left cerebral hemisphere, consistent with an evolving acute left MCA distribution infarct. No acute intracranial hemorrhage. 2. Aspects = 7. 3. Abnormal hyperdensity involving a proximal left MCA branch, concerning for thrombus. CTA HEAD AND NECK: 1. Acute proximal M3 stump occlusion, inferior division left MCA. 2. Otherwise  negative CTA for large vessel occlusion or other emergent finding. 3. Mild for age atheromatous change about the carotid bifurcations and carotid siphons without hemodynamically significant stenosis. 4. Aortic Atherosclerosis (ICD10-I70.0) and Emphysema (ICD10-J43.9). CT PERFUSION: 20 mL acute core infarct involving the posterior left frontoparietal region, posterior left MCA territory. 74 mL surrounding ischemic penumbra. Critical Value/emergent results were called by telephone at the time of interpretation on 07/11/2023 at 11:01 pm to provider Lilyannah Zuelke , who verbally acknowledged these results. Electronically Signed   By: Virgia Griffins M.D.   On: 07/11/2023 23:17    PROCEDURES:  Critical Care performed: Yes, see critical care procedure note(s)  CRITICAL CARE Performed by: Claria Crofts   Total critical care time: 31 minutes  Critical care time was exclusive of separately billable procedures and treating other patients.  Critical care was necessary to treat or prevent imminent or life-threatening deterioration.  Critical care was time spent personally by me on the following activities: development of treatment plan with patient and/or surrogate as well as nursing, discussions with consultants, evaluation of patient's response to treatment, examination of patient, obtaining history from patient or surrogate, ordering and performing treatments and interventions, ordering and review of laboratory studies, ordering and review of radiographic studies, pulse oximetry and re-evaluation of patient's condition.   Procedures   MEDICATIONS ORDERED IN ED: Medications  sodium chloride  flush (NS) 0.9 % injection 3 mL (has no administration in time range)  iohexol  (OMNIPAQUE ) 350 MG/ML injection 125 mL (125 mLs Intravenous Contrast Given 07/11/23 2249)     IMPRESSION / MDM / ASSESSMENT AND PLAN / ED COURSE  I reviewed the triage vital signs and the nursing  notes.  Differential diagnosis includes,  but is not limited to, acute CVA, hypoglycemia, anemia, electrolyte abnormality  Patient's presentation is most consistent with acute presentation with potential threat to life or bodily function.  68 year old male presenting with altered mental status found to have receptive aphasia and left facial droop on exam.  Difficult exam secondary to his receptive aphasia, but given clinical history, I suspect that patient may have had onset of symptoms shortly prior to presentation leading him to present to the ER.  His exam was concerning for possible large vessel occlusion, so a code stroke was activated. I was notified by the radiologist that patient's imaging did demonstrate an LVO with an M3 stump occlusion.  Case is being reviewed by teleneurology with plans for discussion with [facility] interventional team.  Signed out to oncoming physician at 2315 pending discussion with neurology consultants and disposition.    FINAL CLINICAL IMPRESSION(S) / ED DIAGNOSES   Final diagnoses:  Receptive aphasia  Facial droop     Rx / DC Orders   ED Discharge Orders     None        Note:  This document was prepared using Dragon voice recognition software and may include unintentional dictation errors.   Claria Crofts, MD 07/11/23 [phone]

## 2023-07-11 NOTE — Consult Note (Signed)
 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 Head CT shows subacute stroke in the left posterior MCA territory suggesting LKW is probably more than 6 hr or greater. Not a candidate for thrombolysis with already large established area of ischemic on plain head CT and outside window or unknown LKW and is listed as Eliquis  and patient cant confirm if taking or not due to aphasia. M3 occlusion on CTA. Discussed with NIR and they thought possible candidate for thrombectomy but some factors suggesting not the greatest candidate (distal occlusion, early CT evidence of large area of ischemic). They were going to call family and discuss risks/benefits.    I would recommend aspirin 300 PR and aspirin 81 mg daily for now and hold off on eliquis  given large stroke on head CT already seen with real risk of hemorrhagic conversion. Consider starting eliquis  again in 3-6 days.    I recommend admission and obtaining a routine MRI of the brain, echocardiogram, fasting lipids, HbA1c. Patient should be monitored on telemetry. Recommend initiating or switching to high intensity statin (i.e. atorvastatin 40-80 mg or rosuvastatin 20-40 mg) unless contraindication is present. Recommend addressing and modifying all stroke risk factors possible (diet, hypertension, smoking, etc.) and appropriately intervening on findings that are found in the stroke evaluation (i.e. carotid stenosis, atrial fibrillation, etc.).  Discussed with NIR/Neurologist Text: going to discuss with family risks/benefits  Our  recommendations are outlined below.  Recommendations:        Stroke/Telemetry Floor       Neuro Checks (Q4)       Bedside Swallow Eval       DVT Prophylaxis       IV Fluids, Normal Saline       Head of Bed 30 Degrees       Euglycemia and Avoid Hyperthermia (PRN Acetaminophen )       Antihypertensives PRN if Blood pressure is greater than 220/120 or there is a concern for End organ damage/contraindications for permissive HTN. If blood pressure is greater than 220/120 give labetalol PO or IV or Vasotec IV with a goal of 15% reduction in BP during the first 24 hours.  Sign Out:       Discussed with Emergency Department Provider    ------------------------------------------------------------------------------  Advanced Imaging: CTA Head and Neck Completed.  LVO:Yes  Discussed with NIR :Yes  Initial Call Time To NIR : 07/11/2023 23:20:30  NIR Accept/Decision Time : 07/11/2023 23:30:00  Discussed with NIR Time : 07/11/2023 23:27:34  Discussed with NIR Text : going to discuss with family risks/benefits  Neurointerventionalist Accepted Case : Patient is not a candidate for intervention.   Metrics: Last Known Well: Unknown Dispatch Time: 07/11/2023 22:52:20 Arrival Time: 07/11/2023 22:28:00 Initial Response Time: 07/11/2023 23:04:16 Symptoms: Aphasia. Initial patient interaction: 07/11/2023 23:04:24 NIHSS Assessment Completed: 07/11/2023 23:14:00 Patient is not a candidate for Thrombolytic. Thrombolytic Medical Decision: 07/11/2023 23:05:21 Patient was not deemed candidate for Thrombolytic because of following reasons: LKW outside 4.5 hr window. . Use of NOAC in last 48 hrs. . Care-team unable to  determine eligibility. .  CT Head: I personally reviewed all the CT images that were available to me and it showed: area of early/subacute stroke in the left posterior MCA territory  Primary Provider Notified of Diagnostic Impression and Management Plan on: 07/11/2023  23:27:12    ------------------------------------------------------------------------------  History of Present Illness: Patient is a 68 year old Male.  Patient was brought by private transportation with symptoms of Aphasia. Patient presents to the ED through triage with inability to speak. Patient is aphasic and can't give a history. Team in the ED hasn't been able to get ahold of family and family not answering the contact info we have. Past medical history of atrial fibrillation and PE on eliquis  but unclear if patient has been taking or not as can't communicate an answer to us .    Past Medical History:      Hypertension      Atrial Fibrillation      There is no history of Diabetes Mellitus      There is no history of Hyperlipidemia      There is no history of Coronary Artery Disease      There is no history of Stroke      There is no history of Covid-19      There is no history of Seizures      There is no history of Migraine Headaches      There is no history of Dementia/MCI  Medications:  Anticoagulant use:  Yes eliquis  No Antiplatelet use Reviewed EMR for current medications  Allergies:  Reviewed  Social History: Unable To Obtain Due To Patient Status : Patient Cannot Speak  Family History:  There is no family history of premature cerebrovascular disease pertinent to this consultation  ROS : 14 Points Review of Systems was performed and was negative except mentioned in HPI.  Past Surgical History: There Is No Surgical History Contributory To Today's Visit     Examination: BP(154/87), Pulse(76), Blood Glucose(112) 1A: Level of Consciousness - Alert; keenly responsive + 0 1B: Ask Month and Age - Aphasic + 2 1C: Blink Eyes & Squeeze Hands - Performs Both Tasks + 0 2: Test Horizontal Extraocular Movements - Normal + 0 3: Test Visual Fields - No Visual Loss + 0 4: Test Facial Palsy (Use Grimace if Obtunded) - Normal symmetry + 0 5A: Test Left Arm Motor Drift  - No Drift for 10 Seconds + 0 5B: Test Right Arm Motor Drift - No Drift for 10 Seconds + 0 6A: Test Left Leg Motor Drift - No Drift for 5 Seconds + 0 6B: Test Right Leg Motor Drift - No Drift for 5 Seconds + 0 7: Test Limb Ataxia (FNF/Heel-Shin) - No Ataxia + 0 8: Test Sensation - Normal; No sensory loss + 0 9: Test Language/Aphasia - Mute/Global Aphasia: No Usable Speech/Auditory Comprehension + 3 10: Test Dysarthria - Normal + 0 11: Test Extinction/Inattention - No abnormality + 0  NIHSS Score: 5   Pre-Morbid Modified Rankin Scale: Unable to assess  Spoke with : ED provider  This consult was conducted in real time using interactive audio and Immunologist. Patient was informed of the technology being used for this visit and agreed to proceed. Patient located in hospital and provider located at home/office setting.   Patient is being evaluated for possible acute neurologic impairment and high probability of imminent or life-threatening deterioration. I spent total of 40 minutes providing care to this patient, including time for face to face  visit via telemedicine, review of medical records, imaging studies and discussion of findings with providers, the patient and/or family.   Dr Jonae Renshaw   [facility] For Inpatient follow-up with [facility] physician please call [facility] at [phone]. As we are not an outpatient service for any post hospital discharge needs please contact the hospital for assistance. If you have any questions for the [facility] physicians or need to reconsult for clinical or diagnostic changes please contact us  via [facility] at [phone].

## 2023-07-11 NOTE — ED Notes (Signed)
 Pt in CT at this time.

## 2023-07-11 NOTE — ED Notes (Signed)
 Code stroke called to care link spoke with [facility]

## 2023-07-11 NOTE — ED Notes (Signed)
Tele neuro assessment complete.

## 2023-07-11 NOTE — ED Notes (Signed)
 Notified [facility] of code stroke spoke with Zoila Hines

## 2023-07-11 NOTE — Progress Notes (Addendum)
   07/11/23 1045  Spiritual Encounters  Type of Visit Initial  Care provided to: Patient  Conversation partners present during encounter Nurse  Reason for visit Code  OnCall Visit Yes   Chaplain responded to Code Stroke.  Patient was still being assessed when Chaplain arrived.  When Chaplain was able, she went in and introduced herself and [employer] and patient refused spiritual care at this time.  Patient responded, I'm all good.  Chaplain spoke with Nurse about visit.  Rev. Rana M. Nolon Baxter, M.Div. Chaplain Resident [facility]

## 2023-07-11 NOTE — ED Notes (Addendum)
 This nurse spoke with pt brother, Wandalee Gust, and he states he has not spoken or seen the pt in over 24 hours, but unable to give me an actual timeframe

## 2023-07-12 ENCOUNTER — Inpatient Hospital Stay (HOSPITAL_COMMUNITY): Admitting: Certified Registered Nurse Anesthetist

## 2023-07-12 ENCOUNTER — Encounter (HOSPITAL_COMMUNITY): Payer: Self-pay

## 2023-07-12 ENCOUNTER — Inpatient Hospital Stay (HOSPITAL_COMMUNITY)

## 2023-07-12 ENCOUNTER — Inpatient Hospital Stay (HOSPITAL_COMMUNITY)
Admission: EM | Admit: 2023-07-12 | Discharge: 2023-07-15 | DRG: 023 | Disposition: A | Discharge status: Rehabilitation Facility | Source: Other Acute Inpatient Hospital | Attending: Neurology | Admitting: Neurology

## 2023-07-12 ENCOUNTER — Encounter (HOSPITAL_COMMUNITY)
Admission: EM | Disposition: A | Discharge status: Rehabilitation Facility | Payer: Self-pay | Source: Other Acute Inpatient Hospital | Attending: Neurology

## 2023-07-12 DIAGNOSIS — J439 Emphysema, unspecified: Secondary | ICD-10-CM | POA: Diagnosis present

## 2023-07-12 DIAGNOSIS — Z716 Tobacco abuse counseling: Secondary | ICD-10-CM

## 2023-07-12 DIAGNOSIS — I4891 Unspecified atrial fibrillation: Secondary | ICD-10-CM | POA: Diagnosis not present

## 2023-07-12 DIAGNOSIS — Z7984 Long term (current) use of oral hypoglycemic drugs: Secondary | ICD-10-CM | POA: Diagnosis not present

## 2023-07-12 DIAGNOSIS — E669 Obesity, unspecified: Secondary | ICD-10-CM | POA: Diagnosis present

## 2023-07-12 DIAGNOSIS — I69390 Apraxia following cerebral infarction: Secondary | ICD-10-CM | POA: Diagnosis not present

## 2023-07-12 DIAGNOSIS — R739 Hyperglycemia, unspecified: Secondary | ICD-10-CM | POA: Diagnosis not present

## 2023-07-12 DIAGNOSIS — Z79899 Other long term (current) drug therapy: Secondary | ICD-10-CM

## 2023-07-12 DIAGNOSIS — R4702 Dysphasia: Secondary | ICD-10-CM | POA: Diagnosis present

## 2023-07-12 DIAGNOSIS — Z7901 Long term (current) use of anticoagulants: Secondary | ICD-10-CM

## 2023-07-12 DIAGNOSIS — Z6835 Body mass index (BMI) 35.0-35.9, adult: Secondary | ICD-10-CM

## 2023-07-12 DIAGNOSIS — R5381 Other malaise: Secondary | ICD-10-CM | POA: Diagnosis present

## 2023-07-12 DIAGNOSIS — D72829 Elevated white blood cell count, unspecified: Secondary | ICD-10-CM | POA: Diagnosis present

## 2023-07-12 DIAGNOSIS — I5020 Unspecified systolic (congestive) heart failure: Secondary | ICD-10-CM

## 2023-07-12 DIAGNOSIS — I63412 Cerebral infarction due to embolism of left middle cerebral artery: Secondary | ICD-10-CM | POA: Diagnosis not present

## 2023-07-12 DIAGNOSIS — I6389 Other cerebral infarction: Secondary | ICD-10-CM | POA: Diagnosis not present

## 2023-07-12 DIAGNOSIS — Z86711 Personal history of pulmonary embolism: Secondary | ICD-10-CM

## 2023-07-12 DIAGNOSIS — R29705 NIHSS score 5: Secondary | ICD-10-CM | POA: Diagnosis present

## 2023-07-12 DIAGNOSIS — Z8249 Family history of ischemic heart disease and other diseases of the circulatory system: Secondary | ICD-10-CM | POA: Diagnosis not present

## 2023-07-12 DIAGNOSIS — K5900 Constipation, unspecified: Secondary | ICD-10-CM | POA: Diagnosis present

## 2023-07-12 DIAGNOSIS — E119 Type 2 diabetes mellitus without complications: Secondary | ICD-10-CM | POA: Diagnosis not present

## 2023-07-12 DIAGNOSIS — F17210 Nicotine dependence, cigarettes, uncomplicated: Secondary | ICD-10-CM | POA: Diagnosis present

## 2023-07-12 DIAGNOSIS — I69393 Ataxia following cerebral infarction: Secondary | ICD-10-CM | POA: Diagnosis not present

## 2023-07-12 DIAGNOSIS — I63312 Cerebral infarction due to thrombosis of left middle cerebral artery: Secondary | ICD-10-CM | POA: Diagnosis not present

## 2023-07-12 DIAGNOSIS — R29701 NIHSS score 1: Secondary | ICD-10-CM | POA: Diagnosis not present

## 2023-07-12 DIAGNOSIS — I509 Heart failure, unspecified: Secondary | ICD-10-CM | POA: Diagnosis not present

## 2023-07-12 DIAGNOSIS — R410 Disorientation, unspecified: Secondary | ICD-10-CM | POA: Diagnosis not present

## 2023-07-12 DIAGNOSIS — G459 Transient cerebral ischemic attack, unspecified: Secondary | ICD-10-CM | POA: Diagnosis not present

## 2023-07-12 DIAGNOSIS — I110 Hypertensive heart disease with heart failure: Secondary | ICD-10-CM | POA: Diagnosis present

## 2023-07-12 DIAGNOSIS — I5022 Chronic systolic (congestive) heart failure: Secondary | ICD-10-CM | POA: Diagnosis present

## 2023-07-12 DIAGNOSIS — Z794 Long term (current) use of insulin: Secondary | ICD-10-CM | POA: Diagnosis not present

## 2023-07-12 DIAGNOSIS — R4701 Aphasia: Secondary | ICD-10-CM | POA: Diagnosis present

## 2023-07-12 DIAGNOSIS — R931 Abnormal findings on diagnostic imaging of heart and coronary circulation: Secondary | ICD-10-CM

## 2023-07-12 DIAGNOSIS — Z66 Do not resuscitate: Secondary | ICD-10-CM | POA: Diagnosis present

## 2023-07-12 DIAGNOSIS — E1151 Type 2 diabetes mellitus with diabetic peripheral angiopathy without gangrene: Secondary | ICD-10-CM | POA: Diagnosis present

## 2023-07-12 DIAGNOSIS — I2510 Atherosclerotic heart disease of native coronary artery without angina pectoris: Secondary | ICD-10-CM | POA: Diagnosis present

## 2023-07-12 DIAGNOSIS — I69319 Unspecified symptoms and signs involving cognitive functions following cerebral infarction: Secondary | ICD-10-CM | POA: Diagnosis not present

## 2023-07-12 DIAGNOSIS — I63512 Cerebral infarction due to unspecified occlusion or stenosis of left middle cerebral artery: Secondary | ICD-10-CM | POA: Diagnosis present

## 2023-07-12 DIAGNOSIS — I10 Essential (primary) hypertension: Secondary | ICD-10-CM | POA: Diagnosis not present

## 2023-07-12 DIAGNOSIS — I959 Hypotension, unspecified: Secondary | ICD-10-CM | POA: Diagnosis not present

## 2023-07-12 DIAGNOSIS — G936 Cerebral edema: Secondary | ICD-10-CM | POA: Diagnosis present

## 2023-07-12 DIAGNOSIS — Z95810 Presence of automatic (implantable) cardiac defibrillator: Secondary | ICD-10-CM | POA: Diagnosis not present

## 2023-07-12 DIAGNOSIS — Z743 Need for continuous supervision: Secondary | ICD-10-CM | POA: Diagnosis not present

## 2023-07-12 DIAGNOSIS — I428 Other cardiomyopathies: Secondary | ICD-10-CM | POA: Diagnosis present

## 2023-07-12 DIAGNOSIS — E785 Hyperlipidemia, unspecified: Secondary | ICD-10-CM | POA: Diagnosis present

## 2023-07-12 DIAGNOSIS — K5901 Slow transit constipation: Secondary | ICD-10-CM | POA: Diagnosis not present

## 2023-07-12 DIAGNOSIS — I69320 Aphasia following cerebral infarction: Secondary | ICD-10-CM | POA: Diagnosis not present

## 2023-07-12 DIAGNOSIS — I6340 Cerebral infarction due to embolism of unspecified cerebral artery: Secondary | ICD-10-CM | POA: Diagnosis not present

## 2023-07-12 HISTORY — PX: IR CT HEAD LTD: IMG2386

## 2023-07-12 HISTORY — PX: IR PERCUTANEOUS ART THROMBECTOMY/INFUSION INTRACRANIAL INC DIAG ANGIO: IMG6087

## 2023-07-12 HISTORY — PX: RADIOLOGY WITH ANESTHESIA: SHX6223

## 2023-07-12 HISTORY — PX: IR US GUIDE VASC ACCESS RIGHT: IMG2390

## 2023-07-12 HISTORY — DX: Abnormal findings on diagnostic imaging of heart and coronary circulation: R93.1

## 2023-07-12 LAB — HEMOGLOBIN A1C
Hgb A1c MFr Bld: 6 % — ABNORMAL HIGH (ref 4.8–5.6)
Mean Plasma Glucose: 125.5 mg/dL

## 2023-07-12 LAB — LIPID PANEL
Cholesterol: 95 mg/dL (ref 0–200)
HDL: 38 mg/dL — ABNORMAL LOW (ref >40)
LDL Cholesterol: 44 mg/dL (ref 0–99)
Total CHOL/HDL Ratio: 2.5 ratio
Triglycerides: 66 mg/dL (ref <150)
VLDL: 13 mg/dL (ref 0–40)

## 2023-07-12 LAB — ECHOCARDIOGRAM COMPLETE
Area-P 1/2: 2.56 cm2
Height: 75 in
S' Lateral: 5 cm
Weight: 4504.44 [oz_av]

## 2023-07-12 LAB — MRSA NEXT GEN BY PCR, NASAL: MRSA by PCR Next Gen: NOT DETECTED

## 2023-07-12 SURGERY — RADIOLOGY WITH ANESTHESIA
Anesthesia: General

## 2023-07-12 MED ORDER — STROKE: EARLY STAGES OF RECOVERY BOOK
Freq: Once | Status: AC
  Filled 2023-07-12: qty 1

## 2023-07-12 MED ORDER — ETOMIDATE 2 MG/ML IV SOLN: INTRAVENOUS | Status: DC | PRN

## 2023-07-12 MED ORDER — ONDANSETRON HCL 4 MG/2ML IJ SOLN: INTRAMUSCULAR | Status: DC | PRN

## 2023-07-12 MED ORDER — NITROGLYCERIN 1 MG/10 ML FOR IR/CATH LAB
INTRA_ARTERIAL | Status: AC
  Filled 2023-07-12: qty 20

## 2023-07-12 MED ORDER — SODIUM CHLORIDE 0.9 % IV BOLUS
250.0000 mL | INTRAVENOUS | Status: AC | PRN
Start: 2023-07-12 — End: 2023-07-13

## 2023-07-12 MED ORDER — ACETAMINOPHEN 160 MG/5ML PO SOLN: 650.0000 mg | ORAL | Status: DC | PRN

## 2023-07-12 MED ORDER — ORAL CARE MOUTH RINSE
15.0000 mL | OROMUCOSAL | Status: AC | PRN
Start: 2023-07-12 — End: ?

## 2023-07-12 MED ORDER — SUCCINYLCHOLINE CHLORIDE 200 MG/10ML IV SOSY: PREFILLED_SYRINGE | INTRAVENOUS | Status: DC | PRN

## 2023-07-12 MED ORDER — IOHEXOL 300 MG/ML  SOLN: 150.0000 mL | Freq: Once | INTRAMUSCULAR | Status: DC | PRN

## 2023-07-12 MED ORDER — CHLORHEXIDINE GLUCONATE CLOTH 2 % EX PADS: 6.0000 | MEDICATED_PAD | Freq: Every day | CUTANEOUS | Status: DC

## 2023-07-12 MED ORDER — LACTATED RINGERS IV SOLN: INTRAVENOUS | Status: DC | PRN

## 2023-07-12 MED ORDER — SUGAMMADEX SODIUM 200 MG/2ML IV SOLN: INTRAVENOUS | Status: DC | PRN

## 2023-07-12 MED ORDER — HEPARIN SODIUM (PORCINE) 1000 UNIT/ML IJ SOLN
INTRAMUSCULAR | Status: DC | PRN
  Administered 2023-07-12: 3000 [IU] via INTRAVENOUS

## 2023-07-12 MED ORDER — PHENYLEPHRINE 80 MCG/ML (10ML) SYRINGE FOR IV PUSH (FOR BLOOD PRESSURE SUPPORT): PREFILLED_SYRINGE | INTRAVENOUS | Status: DC | PRN

## 2023-07-12 MED ORDER — ACETAMINOPHEN 650 MG RE SUPP: 650.0000 mg | RECTAL | Status: DC | PRN

## 2023-07-12 MED ORDER — PHENYLEPHRINE HCL-NACL 20-0.9 MG/250ML-% IV SOLN: INTRAVENOUS | Status: DC | PRN

## 2023-07-12 MED ORDER — SENNOSIDES-DOCUSATE SODIUM 8.6-50 MG PO TABS
1.0000 | ORAL_TABLET | Freq: Every evening | ORAL | Status: DC | PRN
  Administered 2023-07-15: 1 via ORAL
  Filled 2023-07-12: qty 1

## 2023-07-12 MED ORDER — SODIUM CHLORIDE 0.9 % IV SOLN: INTRAVENOUS | Status: AC

## 2023-07-12 MED ORDER — CLEVIDIPINE BUTYRATE 0.5 MG/ML IV EMUL: 0.0000 mg/h | INTRAVENOUS | Status: DC

## 2023-07-12 MED ORDER — SODIUM CHLORIDE (PF) 0.9 % IJ SOLN
INTRAVENOUS | Status: AC | PRN
  Administered 2023-07-12: 2 ug via INTRA_ARTERIAL

## 2023-07-12 MED ORDER — ROCURONIUM BROMIDE 10 MG/ML (PF) SYRINGE: PREFILLED_SYRINGE | INTRAVENOUS | Status: DC | PRN

## 2023-07-12 MED ORDER — ACETAMINOPHEN 325 MG PO TABS
650.0000 mg | ORAL_TABLET | ORAL | Status: DC | PRN
  Administered 2023-07-13: 650 mg via ORAL
  Filled 2023-07-12: qty 2

## 2023-07-12 MED ORDER — APIXABAN 2.5 MG PO TABS
5.0000 mg | ORAL_TABLET | Freq: Two times a day (BID) | ORAL | Status: DC
  Administered 2023-07-13 – 2023-07-15 (×5): 5 mg via ORAL
  Filled 2023-07-12: qty 2
  Filled 2023-07-12: qty 1
  Filled 2023-07-12 (×4): qty 2

## 2023-07-12 NOTE — Evaluation (Signed)
 Physical Therapy Evaluation Patient Details Name: Henry Schneider MRN: [id number] DOB: 1955/02/20 Today's Date: 07/12/2023  History of Present Illness  Pt is a 68 y.o. male presenting 6/13 via POV to [facility] with aphasia. 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 PMH significant for PE on Eliquis , CHF/NICM, s/p Environmental manager CRT-D placement, HTN, HLD, chronic left bundle branch block, PVD, Buerger's disease, emphysema, tobacco abuse   Clinical Impression  Pt presents with condition above and deficits mentioned below, see PT Problem List. The pt is demonstrating deficits in receptive and expressive communication and no family is currently present to provide home or PLOF info. However, during OT Eval, a niece was present and was able to report that the pt was independent and living alone PTA. Currently, the pt demonstrates some questionable R leg sensation deficits along with potential R attention deficits as he often veers to the R and did bump into a few objects on his R this date. He displays some mild balance deficits that place him at risk for falls and result in a sway and intermittent need for minA for balance when ambulating. As the pt has had a drastic functional decline and has the potential to make great progress in a short period of time, he could benefit from intensive inpatient rehab, > 3 hours/ day with a goal to be mod I for functional mobility. Will continue to follow acutely.      If plan is discharge home, recommend the following: A little help with walking and/or transfers;A little help with bathing/dressing/bathroom;Assistance with cooking/housework;Direct supervision/assist for medications management;Direct supervision/assist for financial management;Assist for transportation;Help with stairs or ramp for entrance;Supervision due to cognitive status   Can travel by private vehicle        Equipment Recommendations None recommended by PT  Recommendations for  Other Services  Rehab consult    Functional Status Assessment Patient has had a recent decline in their functional status and demonstrates the ability to make significant improvements in function in a reasonable and predictable amount of time.     Precautions / Restrictions Precautions Precautions: Fall;Other (comment) (aphasia) Restrictions Weight Bearing Restrictions Per Provider Order: No      Mobility  Bed Mobility Overal bed mobility: Needs Assistance Bed Mobility: Supine to Sit     Supine to sit: Supervision, HOB elevated     General bed mobility comments: Supervision for safety transitioning supine to sit L EOB with HOB elevated, cuing pt through gestures and stating up to perform transition.    Transfers Overall transfer level: Needs assistance Equipment used: None Transfers: Sit to/from Stand Sit to Stand: Contact guard assist           General transfer comment: CGA for safety standing from EOB    Ambulation/Gait Ambulation/Gait assistance: Contact guard assist, Min assist Gait Distance (Feet): 140 Feet Assistive device: None Gait Pattern/deviations: Step-through pattern, Decreased stride length, Drifts right/left Gait velocity: reduced Gait velocity interpretation: 1.31 - 2.62 ft/sec, indicative of limited community ambulator   General Gait Details: Pt tends to veer/sway to his R, intermittently needing minA for balance or to avoid obstacles/objects for his safety. He did bump into obstacles a couple times on the R. Sway noted with simulating stepping over obstacles and with head turns/nods.  Stairs            Wheelchair Mobility     Tilt Bed    Modified Rankin (Stroke Patients Only) Modified Rankin (Stroke Patients Only) Pre-Morbid Rankin Score: No symptoms  Modified Rankin: Moderate disability     Balance Overall balance assessment: Mild deficits observed, not formally tested                                            Pertinent Vitals/Pain Pain Assessment Pain Assessment: Faces Faces Pain Scale: No hurt Pain Intervention(s): Monitored during session    Home Living Family/patient expects to be discharged to:: Private residence Living Arrangements: Alone Available Help at Discharge: Family Type of Home: House             Additional Comments: family lives in [place]  or [place] and pt here alone. Niece present in room was unable to answer questions about home set-up (per OT eval; no family present during PT Eval)    Prior Function Prior Level of Function : Independent/Modified Independent;Driving               ADLs Comments: per niece pt does not work anymore     Extremity/Trunk Assessment   Upper Extremity Assessment Upper Extremity Assessment: Defer to OT evaluation    Lower Extremity Assessment Lower Extremity Assessment: RLE deficits/detail RLE Deficits / Details: pt was slower or not pointing to R side to indicate he could feel light touch as he had done quickly and well for his L with his eyes closed, indicating potential sensation deficits, but difficult to fully assess due to language deficits; strength 5/5 grossly bil with MMT RLE Sensation: decreased light touch (questionable)    Cervical / Trunk Assessment Cervical / Trunk Assessment: Normal  Communication   Communication Communication: Impaired Factors Affecting Communication: Difficulty expressing self;Other (comment) (difficulty with receptive language)    Cognition Arousal: Alert Behavior During Therapy: WFL for tasks assessed/performed, Impulsive   PT - Cognitive impairments: Difficult to assess Difficult to assess due to: Impaired communication                     PT - Cognition Comments: Difficult to assess cognition as pt has difficulty comprehending and expressing speech. He is inconsistent in following cues or answering questions accurately. He tends to do well following gestures and mimicking  therapist instead of following verbal cues Following commands: Impaired Following commands impaired: Follows one step commands with increased time, Follows one step commands inconsistently     Cueing Cueing Techniques: Verbal cues, Tactile cues, Gestural cues, Visual cues (does best with gestures and tactile cues)     General Comments      Exercises     Assessment/Plan    PT Assessment Patient needs continued PT services  PT Problem List Decreased activity tolerance;Decreased balance;Decreased mobility;Decreased cognition;Impaired sensation       PT Treatment Interventions DME instruction;Gait training;Stair training;Functional mobility training;Therapeutic activities;Therapeutic exercise;Balance training;Neuromuscular re-education;Cognitive remediation;Patient/family education    PT Goals (Current goals can be found in the Care Plan section)  Acute Rehab PT Goals Patient Stated Goal: unable to state PT Goal Formulation: Patient unable to participate in goal setting Time For Goal Achievement: 07/26/23 Potential to Achieve Goals: Good    Frequency Min 2X/week     Co-evaluation               AM-PAC PT 6 Clicks Mobility  Outcome Measure Help needed turning from your back to your side while in a flat bed without using bedrails?: A Little Help needed moving from lying on your back to sitting  on the side of a flat bed without using bedrails?: A Little Help needed moving to and from a bed to a chair (including a wheelchair)?: A Little Help needed standing up from a chair using your arms (e.g., wheelchair or bedside chair)?: A Little Help needed to walk in hospital room?: A Little Help needed climbing 3-5 steps with a railing? : Total 6 Click Score: 16    End of Session Equipment Utilized During Treatment: Gait belt Activity Tolerance: Patient tolerated treatment well Patient left: in chair;with call bell/phone within reach;with chair alarm set;Other (comment) (with  fall mat down) Nurse Communication: Mobility status PT Visit Diagnosis: Unsteadiness on feet (R26.81);Other abnormalities of gait and mobility (R26.89);Difficulty in walking, not elsewhere classified (R26.2);Other symptoms and signs involving the nervous system (R29.898)    Time: 4098-1191 PT Time Calculation (min) (ACUTE ONLY): 19 min   Charges:   PT Evaluation $PT Eval Moderate Complexity: 1 Mod   PT General Charges $$ ACUTE PT VISIT: 1 Visit         Vernida Goodie, PT, DPT Acute Rehabilitation Services  Office: [phone]   Ellyn Hack 07/12/2023, 3:45 PM

## 2023-07-12 NOTE — Progress Notes (Signed)
 CT completed, MD paged per order in chart to notify that patient back from CT. Patient stable, still aphasic. Care ongoing.

## 2023-07-12 NOTE — Evaluation (Signed)
 Clinical/Bedside Swallow Evaluation Patient Details  Name: Henry Schneider MRN: [id number] Date of Birth: 11/01/1955  Today's Date: 07/12/2023 Time: SLP Start Time (ACUTE ONLY): 1153 SLP Stop Time (ACUTE ONLY): 1203 SLP Time Calculation (min) (ACUTE ONLY): 10 min  Past Medical History: No past medical history on file. Past Surgical History:  Past Surgical History:  Procedure Laterality Date   IR CT HEAD LTD  07/12/2023   IR PERCUTANEOUS ART THROMBECTOMY/INFUSION INTRACRANIAL INC DIAG ANGIO  07/12/2023   IR US  GUIDE VASC ACCESS RIGHT  07/12/2023   HPI:  Pt is a 68 y.o. male presented to [facility] via private vehicle for evaluation of altered mental status.  He walked into the ER lobby and said something is wrong but could not really have much further conversation.  ED provider evaluation was concerning for aphasia.  Code stroke was activated. CT head (07/12/23) revealed Nonhemorrhagic left temporal and parietal infarct. He underwent thrombectomy 6/14. PMH:  PE on Eliquis , possibly history of A-fib as well, history of systolic CHF/NICM with an EF of 15% in April 2017 with improvement in 2021 to 30% to 35%, status post [facility] CRT-D placement, HTN, HLD, chronic left bundle branch block, peripheral vascular disease, Buerger's disease, emphysema, tobacco abuse.    Assessment / Plan / Recommendation  Clinical Impression  Pt presents without clinical indications for oropharyngeal dysphagia s/p CVA. Oral mechanism examination WFL and adequate dentition present. x2 3oz water swallow challenges (via cup only per pt preference) were completed without s/sx of aspiration to follow. Vocal quality clear. Self- fed bites of puree x2 and graham cracker x3 were swiftly manipulated and cleared from oral cavity completely. Recommend regular diet/thin liquids with adherence to universal swallow precautions. No further SLP f/u indicated for swallow function.  SLP Visit Diagnosis: Dysphagia,  unspecified (R13.10)    Aspiration Risk  No limitations    Diet Recommendation Regular;Thin liquid    Liquid Administration via: Cup Medication Administration: Whole meds with liquid Supervision: Patient able to self feed Compensations: Minimize environmental distractions;Slow rate;Small sips/bites Postural Changes: Seated upright at 90 degrees    Other  Recommendations Oral Care Recommendations: Oral care BID     Assistance Recommended at Discharge    Functional Status Assessment Patient has not had a recent decline in their functional status  Frequency and Duration            Prognosis        Swallow Study   General Date of Onset: 07/12/23 HPI: Pt is a 68 y.o. male presented to [facility] via private vehicle for evaluation of altered mental status.  He walked into the ER lobby and said something is wrong but could not really have much further conversation.  ED provider evaluation was concerning for aphasia.  Code stroke was activated. CT head (07/12/23) revealed Nonhemorrhagic left temporal and parietal infarct. He underwent thrombectomy 6/14. PMH:  PE on Eliquis , possibly history of A-fib as well, history of systolic CHF/NICM with an EF of 15% in April 2017 with improvement in 2021 to 30% to 35%, status post [facility] [employer] CRT-D placement, HTN, HLD, chronic left bundle branch block, peripheral vascular disease, Buerger's disease, emphysema, tobacco abuse. Type of Study: Bedside Swallow Evaluation Previous Swallow Assessment: none per EMR Diet Prior to this Study: NPO Temperature Spikes Noted: No Respiratory Status: Room air History of Recent Intubation: No Behavior/Cognition: Alert;Cooperative;Pleasant mood;Requires cueing Oral Cavity Assessment: Within Functional Limits Oral Care Completed by SLP: No Oral Cavity - Dentition: Adequate natural  dentition Vision: Functional for self-feeding Self-Feeding Abilities: Able to feed self Patient Positioning:  Upright in chair;Postural control adequate for testing Baseline Vocal Quality: Normal Volitional Cough: Strong Volitional Swallow: Able to elicit    Oral/Motor/Sensory Function Overall Oral Motor/Sensory Function: Within functional limits   Ice Chips Ice chips: Within functional limits Presentation: Spoon   Thin Liquid Thin Liquid: Within functional limits Presentation: Cup;Self Fed    Nectar Thick Nectar Thick Liquid: Not tested   Honey Thick Honey Thick Liquid: Not tested   Puree Puree: Within functional limits Presentation: Self Fed;Spoon   Solid     Solid: Within functional limits Presentation: Self Fed       Gordon Latus, MA, CCC-SLP Acute Rehabilitation Services Office Number: [phone]  Corliss Dies 07/12/2023,12:57 PM

## 2023-07-12 NOTE — Evaluation (Signed)
 Occupational Therapy Evaluation Patient Details Name: Henry Schneider MRN: [id number] DOB: Aug 16, 1955 Today's Date: 07/12/2023   History of Present Illness   Pt is a 68 y.o. male presenting 6/13 via POV to [facility] with aphasia. 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 PMH significant for PE on Eliquis , CHF/NICM, s/p Environmental manager CRT-D placement, HTN, HLD, chronic left bundle branch block, PVD, Buerger's disease, emphysema, tobacco abuse     Clinical Impressions PTA, pt lived alone and per niece present in room, was independent. Upon eval, pt unable to provide home set-up due to communication difficulties expressively and receptively. Pt needing up to Min A for BADL for safety and cues to sequence through tasks noting pt to be mildly impulsive throughout increasing risk of falls. Pt benefiting from multimodal cues to optimize command following. Will continue to follow acutely, and recommending inpatient rehab >3 hours to optimize safety and independence with return to ADL.      If plan is discharge home, recommend the following:   A little help with walking and/or transfers;A little help with bathing/dressing/bathroom;A lot of help with bathing/dressing/bathroom;Assistance with cooking/housework;Assist for transportation;Help with stairs or ramp for entrance;Direct supervision/assist for financial management;Direct supervision/assist for medications management     Functional Status Assessment   Patient has had a recent decline in their functional status and demonstrates the ability to make significant improvements in function in a reasonable and predictable amount of time.     Equipment Recommendations   Other (comment) (defer)     Recommendations for Other Services   Rehab consult;Speech consult     Precautions/Restrictions   Precautions Precautions: Fall;Other (comment) (aphasia)     Mobility Bed Mobility Overal bed mobility: Needs Assistance Bed  Mobility: Supine to Sit     Supine to sit: Supervision     General bed mobility comments: for safety    Transfers Overall transfer level: Needs assistance   Transfers: Sit to/from Stand Sit to Stand: Contact guard assist           General transfer comment: for safety, pt mildly impulsove      Balance Overall balance assessment: Needs assistance         Standing balance support: No upper extremity supported Standing balance-Leahy Scale: Fair                             ADL either performed or assessed with clinical judgement   ADL Overall ADL's : Needs assistance/impaired Eating/Feeding: NPO   Grooming: Minimal assistance;Standing Grooming Details (indicate cue type and reason): cues for sequence             Lower Body Dressing: Set up;Sitting/lateral leans;Contact guard assist Lower Body Dressing Details (indicate cue type and reason): for socks             Functional mobility during ADLs: Contact guard assist       Vision Baseline Vision/History: 1 Wears glasses Ability to See in Adequate Light: 0 Adequate Patient Visual Report: No change from baseline Vision Assessment?: Vision impaired- to be further tested in functional context Additional Comments: questionable vision changes and decr vision on R side     Perception         Praxis         Pertinent Vitals/Pain Pain Assessment Pain Assessment: No/denies pain     Extremity/Trunk Assessment Upper Extremity Assessment Upper Extremity Assessment: Overall WFL for tasks assessed (questionable sensation changes but unable  to tell whether pt referring to R or L side)   Lower Extremity Assessment Lower Extremity Assessment: Defer to PT evaluation       Communication Communication Communication: Impaired Factors Affecting Communication: Difficulty expressing self;Other (comment) (difficulty with receptive language)   Cognition Arousal: Alert Behavior During Therapy: WFL  for tasks assessed/performed, Impulsive Cognition: Cognition impaired, Difficult to assess Difficult to assess due to: Impaired communication   Awareness: Intellectual awareness intact, Online awareness impaired   Attention impairment (select first level of impairment): Focused attention, Sustained attention Executive functioning impairment (select all impairments): Sequencing, Organization, Problem solving OT - Cognition Comments: pt follows one-two step commands with multimodal directions. Limited eval by receptive and expressive language deficits                 Following commands: Impaired Following commands impaired: Follows one step commands with increased time, Follows one step commands inconsistently     Cueing  General Comments   Cueing Techniques: Verbal cues;Tactile cues;Gestural cues;Visual cues      Exercises     Shoulder Instructions      Home Living Family/patient expects to be discharged to:: Private residence Living Arrangements: Alone Available Help at Discharge: Family                             Additional Comments: family lives in [place]  or [place] and pt here alone. Niece present in room was unable to answer questions about home set-up      Prior Functioning/Environment Prior Level of Function : Independent/Modified Independent;Driving               ADLs Comments: per niece pt does not work anymore    OT Problem List: Decreased strength;Decreased activity tolerance;Impaired balance (sitting and/or standing);Impaired vision/perception;Decreased cognition;Decreased safety awareness   OT Treatment/Interventions: Self-care/ADL training;Therapeutic exercise;DME and/or AE instruction;Therapeutic activities;Patient/family education;Balance training      OT Goals(Current goals can be found in the care plan section)   Acute Rehab OT Goals Patient Stated Goal: unable OT Goal Formulation: Patient unable to participate in goal  setting Time For Goal Achievement: 07/26/23 Potential to Achieve Goals: Good   OT Frequency:  Min 2X/week    Co-evaluation              AM-PAC OT 6 Clicks Daily Activity     Outcome Measure Help from another person eating meals?: Total Help from another person taking care of personal grooming?: A Little Help from another person toileting, which includes using toliet, bedpan, or urinal?: A Little Help from another person bathing (including washing, rinsing, drying)?: A Little Help from another person to put on and taking off regular upper body clothing?: A Little Help from another person to put on and taking off regular lower body clothing?: A Little 6 Click Score: 16   End of Session Equipment Utilized During Treatment: Gait belt Nurse Communication: Mobility status  Activity Tolerance: Patient tolerated treatment well Patient left: in chair;with call bell/phone within reach;with chair alarm set;with family/visitor present  OT Visit Diagnosis: Unsteadiness on feet (R26.81);Low vision, both eyes (H54.2);Other symptoms and signs involving cognitive function;Cognitive communication deficit (R41.841)                Time: 1610-9604 OT Time Calculation (min): 31 min Charges:  OT General Charges $OT Visit: 1 Visit OT Evaluation $OT Eval Moderate Complexity: 1 Mod OT Treatments $Self Care/Home Management : 8-22 mins  Emery Hans, OTD, OTR/L  [facility] Acute Rehabilitation Office: [phone]   Emery Hans 07/12/2023, 12:32 PM

## 2023-07-12 NOTE — Plan of Care (Addendum)
 Patient post IR.  Admitted to neuro ICU.  See Dr. Adel Aden procedure note.  Repeat imaging-see he had around 8 AM. If no evidence of bleed, may need heparin  stroke protocol started-since he has A-fib and PE for which she was on Eliquis   Has a pacemaker-will need some information on the pacemaker and coordination with MRI to get the MRI done.  Tona Francis, MD Neurology

## 2023-07-12 NOTE — Anesthesia Procedure Notes (Signed)
 Procedure Name: Intubation Date/Time: 07/12/2023 12:53 AM  Performed by: Christain Courser., CRNAPre-anesthesia Checklist: Patient identified, Emergency Drugs available, Suction available, Patient being monitored and Timeout performed Patient Re-evaluated:Patient Re-evaluated prior to induction Oxygen Delivery Method: Circle system utilized Preoxygenation: Pre-oxygenation with 100% oxygen Induction Type: IV induction, Rapid sequence and Cricoid Pressure applied Laryngoscope Size: Mac and 4 Grade View: Grade I Tube type: Oral Tube size: 7.5 mm Number of attempts: 1 Airway Equipment and Method: Stylet Placement Confirmation: ETT inserted through vocal cords under direct vision, positive ETCO2 and breath sounds checked- equal and bilateral Secured at: 23 cm Tube secured with: Tape Dental Injury: Teeth and Oropharynx as per pre-operative assessment

## 2023-07-12 NOTE — Anesthesia Postprocedure Evaluation (Signed)
 Anesthesia Post Note  Patient: Henry Schneider  Procedure(s) Performed: RADIOLOGY WITH ANESTHESIA     Patient location during evaluation: PACU Anesthesia Type: General Level of consciousness: awake Pain management: pain level controlled Vital Signs Assessment: post-procedure vital signs reviewed and stable Respiratory status: spontaneous breathing, nonlabored ventilation and respiratory function stable Cardiovascular status: blood pressure returned to baseline and stable Postop Assessment: no apparent nausea or vomiting Anesthetic complications: no   No notable events documented.                Jowanda Heeg,W. EDMOND

## 2023-07-12 NOTE — Procedures (Signed)
 NIR Procedure Note  Preop Dx: Acute ischemic stroke Post Dx: Same  Procedure: Left M2/M3 thrombectomy (suction) Operator: Reagan Camera MD  EBL: 200 mL Complications: No immediate  Findings: Relatively large transverse M3 branch perfusing the posterior left parietal lobe was identified.  Suction thrombectomy was performed x 3 with partial recanalization. No evidence of hemorrhage on post CT. TICI Score: 2b  Length of Bedrest: 2 hours from NIR standpoint BP goal next 24 hours: SBP 120-160  Sheath Closure: right femoral angioseal Disposition: To PACU  Please call with questions, concerns, or change in patient condition  Reagan Camera MD  Cased discussed with Dr. Arora.  Consider AM head CT and resumption of anticoagulation if no evidence of bleed.

## 2023-07-12 NOTE — Transfer of Care (Signed)
 Immediate Anesthesia Transfer of Care Note  Patient: Henry Schneider  Procedure(s) Performed: RADIOLOGY WITH ANESTHESIA  Patient Location: PACU  Anesthesia Type:General  Level of Consciousness: awake, alert , and drowsy  Airway & Oxygen Therapy: Patient Spontanous Breathing and Patient connected to nasal cannula oxygen  Post-op Assessment: Report given to RN and Post -op Vital signs reviewed and stable  Post vital signs: Reviewed and stable  Last Vitals:  Vitals Value Taken Time  BP 133/78   Temp    Pulse 60 07/12/23 02:49  Resp 22 07/12/23 02:49  SpO2 94 % 07/12/23 02:49  Vitals shown include unfiled device data.  Last Pain: There were no vitals filed for this visit.       Complications: No notable events documented.

## 2023-07-12 NOTE — Progress Notes (Signed)
 STROKE TEAM PROGRESS NOTE   INTERVAL HISTORY Patient seen and evaluated this morning, niece at bedside. No acute overnight events noted. He is status post TICI2B recanalization, repeat head CT this morning with no acute hemorrhage, just evolution of the stroke.   Vitals:   07/12/23 1000 07/12/23 1100 07/12/23 1200 07/12/23 1300  BP: 116/78 102/66 94/76 116/79  Pulse: 66 78 68   Resp: (!) 21 (!) 25 16   Temp:  97.6 F (36.4 C)    TempSrc:  Axillary    SpO2: 93% 91% 93%   Weight:      Height:       CBC:  Recent Labs  Lab 07/11/23 2240  WBC 13.9*  NEUTROABS 9.2*  HGB 16.5  HCT 49.9  MCV 95.2  PLT 189   Basic Metabolic Panel:  Recent Labs  Lab 07/11/23 2240  NA 138  K 4.3  CL 110  CO2 21*  GLUCOSE 106*  BUN 27*  CREATININE 1.13  CALCIUM 9.0   Lipid Panel:  Recent Labs  Lab 07/12/23 0705  CHOL 95  TRIG 66  HDL 38*  CHOLHDL 2.5  VLDL 13  LDLCALC 44   HgbA1c:  Recent Labs  Lab 07/12/23 0704  HGBA1C 6.0*   Urine Drug Screen: No results for input(s): LABOPIA, COCAINSCRNUR, LABBENZ, AMPHETMU, THCU, LABBARB in the last 168 hours.  Alcohol Level  Recent Labs  Lab 07/11/23 2240  ETH <15    IMAGING past 24 hours ECHOCARDIOGRAM COMPLETE Result Date: 07/12/2023    ECHOCARDIOGRAM REPORT   Patient Name:   Henry Schneider Date of Exam: 07/12/2023 Medical Rec #:  [id number]      Height:       75.0 in Accession #:    [id number]     Weight:       281.5 lb Date of Birth:  1955-07-03      BSA:          2.538 m Patient Age:    68 years       BP:           95/84 mmHg Patient Gender: M              HR:           63 bpm. Exam Location:  Inpatient Procedure: 2D Echo, Cardiac Doppler and Color Doppler (Both Spectral and Color            Flow Doppler were utilized during procedure). Indications:    Stroke I63.9  History:        Patient has prior history of Echocardiogram examinations, most                 recent 12/19/2014. CHF, Stroke; Risk Factors:Current Smoker.   Sonographer:    Kip Peon RDCS Referring Phys: [id number] ASHISH ARORA IMPRESSIONS  1. Left ventricular ejection fraction, by estimation, is 25 to 30%. The left ventricle has severely decreased function. The left ventricle demonstrates regional wall motion abnormalities (see scoring diagram/findings for description). There is mild left  ventricular hypertrophy. Left ventricular diastolic parameters are consistent with Grade I diastolic dysfunction (impaired relaxation).  2. Right ventricular systolic function is normal. The right ventricular size is mildly enlarged. There is normal pulmonary artery systolic pressure. The estimated right ventricular systolic pressure is 26.8 mmHg.  3. Left atrial size was mildly dilated.  4. Right atrial size was mildly dilated.  5. The mitral valve is normal in structure. Trivial mitral valve regurgitation. No evidence of  mitral stenosis.  6. The aortic valve is tricuspid. Aortic valve regurgitation is not visualized. No aortic stenosis is present.  7. Aortic dilatation noted. There is dilatation of the ascending aorta, measuring 40 mm.  8. The inferior vena cava is normal in size with greater than 50% respiratory variability, suggesting right atrial pressure of 3 mmHg. Conclusion(s)/Recommendation(s): Given presentation with CVA and severe systolic dysfunction with apical akinesis, recommend repeat limited echo with contrast to evaluate for LV thrombus. FINDINGS  Left Ventricle: Left ventricular ejection fraction, by estimation, is 25 to 30%. The left ventricle has severely decreased function. The left ventricle demonstrates regional wall motion abnormalities. The left ventricular internal cavity size was normal  in size. There is mild left ventricular hypertrophy. Left ventricular diastolic parameters are consistent with Grade I diastolic dysfunction (impaired relaxation).  LV Wall Scoring: The entire lateral wall, entire inferior wall, apical anterior segment, and apex are  akinetic. The anterior wall and entire septum are normal. Right Ventricle: The right ventricular size is mildly enlarged. No increase in right ventricular wall thickness. Right ventricular systolic function is normal. There is normal pulmonary artery systolic pressure. The tricuspid regurgitant velocity is 2.44  m/s, and with an assumed right atrial pressure of 3 mmHg, the estimated right ventricular systolic pressure is 26.8 mmHg. Left Atrium: Left atrial size was mildly dilated. Right Atrium: Right atrial size was mildly dilated. Pericardium: There is no evidence of pericardial effusion. Mitral Valve: The mitral valve is normal in structure. Trivial mitral valve regurgitation. No evidence of mitral valve stenosis. Tricuspid Valve: The tricuspid valve is normal in structure. Tricuspid valve regurgitation is trivial. Aortic Valve: The aortic valve is tricuspid. Aortic valve regurgitation is not visualized. No aortic stenosis is present. Pulmonic Valve: The pulmonic valve was not well visualized. Pulmonic valve regurgitation is not visualized. Aorta: The aortic root is normal in size and structure and aortic dilatation noted. There is dilatation of the ascending aorta, measuring 40 mm. Venous: The inferior vena cava is normal in size with greater than 50% respiratory variability, suggesting right atrial pressure of 3 mmHg. IAS/Shunts: The interatrial septum was not well visualized.  LEFT VENTRICLE PLAX 2D LVIDd:         5.50 cm   Diastology LVIDs:         5.00 cm   LV e' medial:    4.03 cm/s LV PW:         1.30 cm   LV E/e' medial:  10.8 LV IVS:        1.20 cm   LV e' lateral:   5.12 cm/s LVOT diam:     2.10 cm   LV E/e' lateral: 8.5 LV SV:         42 LV SV Index:   17 LVOT Area:     3.46 cm  RIGHT VENTRICLE             IVC RV Basal diam:  4.60 cm     IVC diam: 2.00 cm RV S prime:     19.30 cm/s TAPSE (M-mode): 3.1 cm LEFT ATRIUM              Index        RIGHT ATRIUM           Index LA diam:        3.80 cm  1.50  cm/m   RA Area:     26.20 cm LA Vol (A2C):   69.5 ml  27.39 ml/m  RA Volume:   80.50 ml  31.72 ml/m LA Vol (A4C):   104.0 ml 40.98 ml/m LA Biplane Vol: 89.7 ml  35.35 ml/m  AORTIC VALVE LVOT Vmax:   65.70 cm/s LVOT Vmean:  41.700 cm/s LVOT VTI:    0.121 m  AORTA Ao Root diam: 3.90 cm Ao Asc diam:  4.00 cm MITRAL VALVE               TRICUSPID VALVE MV Area (PHT): 2.56 cm    TR Peak grad:   23.8 mmHg MV Decel Time: 296 msec    TR Vmax:        244.00 cm/s MV E velocity: 43.50 cm/s MV A velocity: 78.10 cm/s  SHUNTS MV E/A ratio:  0.56        Systemic VTI:  0.12 m                            Systemic Diam: 2.10 cm Carson Clara MD Electronically signed by Carson Clara MD Signature Date/Time: 07/12/2023/1:15:57 PM    Final    IR PERCUTANEOUS ART THROMBECTOMY/INFUSION INTRACRANIAL INC DIAG ANGIO Result Date: 07/12/2023 PROCEDURE PERFORMED: 1. Stroke thrombectomy 2. Ultrasound vascular access 3. Cone beam CT for treatment planning COMPARISON:  CT perfusion performed July 11, 2023 CLINICAL DATA:  68 year old male with history of acute ischemic stroke with symptoms aphasia. NIH stroke scale was measured at 5, however, symptoms were disabling. Patient was anticoagulated and not a candidate for thrombo lytic therapy. Stroke imaging was notable for a large vessel occlusion with left M2/M3 occlusion. Patient was reviewed in a multidisciplinary setting and considered a suitable candidate for stroke thrombectomy. INDICATION: Acute ischemic stroke. ANESTHESIA/SEDATION: General anesthesia was utilized for the procedure. CONTRAST:  Approximately 100 cc Omnipaque  300 MEDICATIONS: Heparin  3000 units intravenously FLUOROSCOPY TIME:  Fluoroscopy Time: 19 minutes (2113 mGy). COMPLICATIONS: None immediate. BODY OF REPORT: Following a full explanation of the procedure along with the potential associated complications, an informed witnessed consent was obtained. The patient was then placed under general anesthesia by  the Department of Anesthesiology at [facility]. The right groin was prepped and draped in the usual sterile fashion. Ultrasound was used to study the right superficial femoral artery which was patent. Using real-time ultrasound guidance, a 21 gauge introducer needle was used to access the right common femoral artery. Access was performed at 0054. A hard copy image ultrasound the same date uncertain PACS. Using this access, a 6 French sheath was placed in the descending thoracic aorta. Approximately 3000 units of heparin  was administered intravenously. Next, selective catheterization the left internal carotid artery was performed. A selective arteriogram was performed which demonstrated that the internal carotid artery was patent. In occlusion of an inferior M3 division artery was observed with no significant perfusion to the posterior parietal lobe through this vessel. The remaining MCA branches were patent. Next, a penumbra 43 and red 62 catheter was used to selectively catheterize the main trunk of the left M2 division. A selective arteriogram confirmed the M3 branch occlusion. Next, a wire was advanced into the M3 branch occlusion in this was followed with advancement of the penumbra 43 catheter. Suction thrombectomy was performed with first pass at 01:23. The first treatment demonstrated improvement in perfusion to this territory with residual wall adherent thrombus at a bifurcation point as well as along the wall of the target vessel. A second pass was performed at 01:40 with modest improvement. A third pass  was performed at 01:59 using a zoom 35 catheter. A small volume of thrombus material was removed and I elected to terminate the procedure after this pass. Partial recanalization was achieved with a TICI score 2b. After reviewing the imaging I elected to terminate the procedure at this point. Evaluation of the right femoral access site demonstrated at this site was suitable for closure device. A 6  French Angio-Seal device was deployed without complication. Cone beam CT was then performed to evaluate for intracranial hemorrhage and treatment planning. This demonstrated no evidence significant acute intracranial hemorrhage. The patient was removed from anesthesia and transferred to recovery in stable condition. IMPRESSION: 1. Suction thrombectomy of left M3 branch occlusion which provide significant perfusion to the posterior left parietal lobe. 2. Post treatment TICI score 2b. PLAN: 1. To ICU for routine postoperative supportive care. 2. A follow-up CT is planned for early morning. If there is no evidence of hemorrhage, given suspected cardioembolic etiology and residual nonocclusive wall adherent thrombus, consideration may be given toward initiation of a neuro dose heparin  infusion. Electronically Signed   By: Reagan Camera M.D.   On: 07/12/2023 09:23   IR CT Head Ltd Result Date: 07/12/2023 PROCEDURE PERFORMED: 1. Stroke thrombectomy 2. Ultrasound vascular access 3. Cone beam CT for treatment planning COMPARISON:  CT perfusion performed July 11, 2023 CLINICAL DATA:  68 year old male with history of acute ischemic stroke with symptoms aphasia. NIH stroke scale was measured at 5, however, symptoms were disabling. Patient was anticoagulated and not a candidate for thrombo lytic therapy. Stroke imaging was notable for a large vessel occlusion with left M2/M3 occlusion. Patient was reviewed in a multidisciplinary setting and considered a suitable candidate for stroke thrombectomy. INDICATION: Acute ischemic stroke. ANESTHESIA/SEDATION: General anesthesia was utilized for the procedure. CONTRAST:  Approximately 100 cc Omnipaque  300 MEDICATIONS: Heparin  3000 units intravenously FLUOROSCOPY TIME:  Fluoroscopy Time: 19 minutes (2113 mGy). COMPLICATIONS: None immediate. BODY OF REPORT: Following a full explanation of the procedure along with the potential associated complications, an informed witnessed consent was  obtained. The patient was then placed under general anesthesia by the Department of Anesthesiology at [facility]. The right groin was prepped and draped in the usual sterile fashion. Ultrasound was used to study the right superficial femoral artery which was patent. Using real-time ultrasound guidance, a 21 gauge introducer needle was used to access the right common femoral artery. Access was performed at 0054. A hard copy image ultrasound the same date uncertain PACS. Using this access, a 6 French sheath was placed in the descending thoracic aorta. Approximately 3000 units of heparin  was administered intravenously. Next, selective catheterization the left internal carotid artery was performed. A selective arteriogram was performed which demonstrated that the internal carotid artery was patent. In occlusion of an inferior M3 division artery was observed with no significant perfusion to the posterior parietal lobe through this vessel. The remaining MCA branches were patent. Next, a penumbra 43 and red 62 catheter was used to selectively catheterize the main trunk of the left M2 division. A selective arteriogram confirmed the M3 branch occlusion. Next, a wire was advanced into the M3 branch occlusion in this was followed with advancement of the penumbra 43 catheter. Suction thrombectomy was performed with first pass at 01:23. The first treatment demonstrated improvement in perfusion to this territory with residual wall adherent thrombus at a bifurcation point as well as along the wall of the target vessel. A second pass was performed at 01:40 with modest improvement. A  third pass was performed at 01:59 using a zoom 35 catheter. A small volume of thrombus material was removed and I elected to terminate the procedure after this pass. Partial recanalization was achieved with a TICI score 2b. After reviewing the imaging I elected to terminate the procedure at this point. Evaluation of the right femoral access  site demonstrated at this site was suitable for closure device. A 6 French Angio-Seal device was deployed without complication. Cone beam CT was then performed to evaluate for intracranial hemorrhage and treatment planning. This demonstrated no evidence significant acute intracranial hemorrhage. The patient was removed from anesthesia and transferred to recovery in stable condition. IMPRESSION: 1. Suction thrombectomy of left M3 branch occlusion which provide significant perfusion to the posterior left parietal lobe. 2. Post treatment TICI score 2b. PLAN: 1. To ICU for routine postoperative supportive care. 2. A follow-up CT is planned for early morning. If there is no evidence of hemorrhage, given suspected cardioembolic etiology and residual nonocclusive wall adherent thrombus, consideration may be given toward initiation of a neuro dose heparin  infusion. Electronically Signed   By: Reagan Camera M.D.   On: 07/12/2023 09:23   IR US  Guide Vasc Access Right Result Date: 07/12/2023 PROCEDURE PERFORMED: 1. Stroke thrombectomy 2. Ultrasound vascular access 3. Cone beam CT for treatment planning COMPARISON:  CT perfusion performed July 11, 2023 CLINICAL DATA:  68 year old male with history of acute ischemic stroke with symptoms aphasia. NIH stroke scale was measured at 5, however, symptoms were disabling. Patient was anticoagulated and not a candidate for thrombo lytic therapy. Stroke imaging was notable for a large vessel occlusion with left M2/M3 occlusion. Patient was reviewed in a multidisciplinary setting and considered a suitable candidate for stroke thrombectomy. INDICATION: Acute ischemic stroke. ANESTHESIA/SEDATION: General anesthesia was utilized for the procedure. CONTRAST:  Approximately 100 cc Omnipaque  300 MEDICATIONS: Heparin  3000 units intravenously FLUOROSCOPY TIME:  Fluoroscopy Time: 19 minutes (2113 mGy). COMPLICATIONS: None immediate. BODY OF REPORT: Following a full explanation of the procedure  along with the potential associated complications, an informed witnessed consent was obtained. The patient was then placed under general anesthesia by the Department of Anesthesiology at [facility]. The right groin was prepped and draped in the usual sterile fashion. Ultrasound was used to study the right superficial femoral artery which was patent. Using real-time ultrasound guidance, a 21 gauge introducer needle was used to access the right common femoral artery. Access was performed at 0054. A hard copy image ultrasound the same date uncertain PACS. Using this access, a 6 French sheath was placed in the descending thoracic aorta. Approximately 3000 units of heparin  was administered intravenously. Next, selective catheterization the left internal carotid artery was performed. A selective arteriogram was performed which demonstrated that the internal carotid artery was patent. In occlusion of an inferior M3 division artery was observed with no significant perfusion to the posterior parietal lobe through this vessel. The remaining MCA branches were patent. Next, a penumbra 43 and red 62 catheter was used to selectively catheterize the main trunk of the left M2 division. A selective arteriogram confirmed the M3 branch occlusion. Next, a wire was advanced into the M3 branch occlusion in this was followed with advancement of the penumbra 43 catheter. Suction thrombectomy was performed with first pass at 01:23. The first treatment demonstrated improvement in perfusion to this territory with residual wall adherent thrombus at a bifurcation point as well as along the wall of the target vessel. A second pass was performed at 01:40  with modest improvement. A third pass was performed at 01:59 using a zoom 35 catheter. A small volume of thrombus material was removed and I elected to terminate the procedure after this pass. Partial recanalization was achieved with a TICI score 2b. After reviewing the imaging I elected  to terminate the procedure at this point. Evaluation of the right femoral access site demonstrated at this site was suitable for closure device. A 6 French Angio-Seal device was deployed without complication. Cone beam CT was then performed to evaluate for intracranial hemorrhage and treatment planning. This demonstrated no evidence significant acute intracranial hemorrhage. The patient was removed from anesthesia and transferred to recovery in stable condition. IMPRESSION: 1. Suction thrombectomy of left M3 branch occlusion which provide significant perfusion to the posterior left parietal lobe. 2. Post treatment TICI score 2b. PLAN: 1. To ICU for routine postoperative supportive care. 2. A follow-up CT is planned for early morning. If there is no evidence of hemorrhage, given suspected cardioembolic etiology and residual nonocclusive wall adherent thrombus, consideration may be given toward initiation of a neuro dose heparin  infusion. Electronically Signed   By: Reagan Camera M.D.   On: 07/12/2023 09:23   DG Chest 1 View Result Date: 07/12/2023 CLINICAL DATA:  Aspiration into airway. EXAM: CHEST  1 VIEW COMPARISON:  12/19/2014 FINDINGS: Moderate cardiomegaly again seen. AICD with leads overlying the right heart. Moderate elevation of left hemidiaphragm noted. Both lungs are clear. IMPRESSION: Moderate cardiomegaly. No active lung disease. Elevated left hemidiaphragm. Electronically Signed   By: Marlyce Sine M.D.   On: 07/12/2023 08:59   CT HEAD POST STROKE FOLLOWUP/TIMED/STAT READ Result Date: 07/12/2023 EXAM: CT HEAD WITHOUT CONTRAST 07/12/2023 07:53:59 AM TECHNIQUE: CT of the head was performed without the administration of intravenous contrast. Automated exposure control, iterative reconstruction, and/or weight based adjustment of the mA/kV was utilized to reduce the radiation dose to as low as reasonably achievable. COMPARISON: CT head without contrast and CT angio head and neck 07/11/2023. CLINICAL  HISTORY: Neuro deficit, acute, stroke suspected. FINDINGS: BRAIN AND VENTRICLES: The nonhemorrhagic left temporal and parietal infarct demonstrates expected evolution. No acute hemorrhage is present. No significant expansion of the infarct is present. Mass effect is present with effacement of the sulci. 1 to 2 mm midline shift is present. Cavum sptum pellucidum is present. ORBITS: The visualized portion of the orbits demonstrate no acute abnormality. SINUSES: Mild mucosal thickening is present in the right greater than left maxillary sinus. The paranasal sinuses and mastoid air cells are otherwise clear. SOFT TISSUES AND SKULL: No acute abnormality of the visualized skull or soft tissues. IMPRESSION: 1. Nonhemorrhagic left temporal and parietal infarct with expected evolution. 2. Developing mass effect, effacement of the sulci, and 1 to 2 mm midline shift is present. 3. No acute hemorrhage or significant expansion of the infarct. Electronically signed by: Audree Leas MD 07/12/2023 08:02 AM EDT RP Workstation: [id number]   CT HEAD CODE STROKE WO CONTRAST` Result Date: 07/11/2023 CLINICAL DATA:  Initial EXAM: CT ANGIOGRAPHY HEAD AND NECK CT PERFUSION BRAIN TECHNIQUE: Multidetector CT imaging of the head and neck was performed using the standard protocol during bolus administration of intravenous contrast. Multiplanar CT image reconstructions and MIPs were obtained to evaluate the vascular anatomy. Carotid stenosis measurements (when applicable) are obtained utilizing NASCET criteria, using the distal internal carotid diameter as the denominator. Multiphase CT imaging of the brain was performed following IV bolus contrast injection. Subsequent parametric perfusion maps were calculated using RAPID software. RADIATION DOSE REDUCTION: This  exam was performed according to the departmental dose-optimization program which includes automated exposure control, adjustment of the mA and/or kV according to patient size  and/or use of iterative reconstruction technique. CONTRAST:  OMNIPAQUE  IOHEXOL  350 MG/ML SOLN COMPARISON:  None available. FINDINGS: CT HEAD FINDINGS Brain: Evolving cytotoxic edema involving the left insula and posterior left cerebral hemisphere, consistent with an evolving left MCA distribution infarct. Mild patchy involvement of the anterior left frontal lobe as well. Sparing of the basal ganglia. No significant mass effect or midline shift. No hydrocephalus. No acute intracranial hemorrhage. No extra-axial fluid collection. Vascular: Abnormal hyperdensity involving proximal left MCA branch, into and/or M3 segment, concerning for thrombus (series 4, image 13). Skull: Scalp soft tissues within normal limits.  Calvarium intact. Sinuses/Orbits: Globes orbital soft tissues within normal limits. Chronic mucosal thickening present about the right sphenoid sinus. Paranasal sinuses are otherwise clear. No mastoid effusion. Other: None. ASPECTS ([place] Stroke Program Early CT Score) - Ganglionic level infarction (caudate, lentiform nuclei, internal capsule, insula, M1-M3 cortex): 6 - Supraganglionic infarction (M4-M6 cortex): 1 Total score (0-10 with 10 being normal): 7 Review of the MIP images confirms the above findings CTA NECK FINDINGS Aortic arch: Visualized aortic arch within normal limits for caliber standard branch pattern. Mild aortic atherosclerosis. No significant stenosis about the origin of the great vessels. Right carotid system: Right common and internal carotid arteries are patent without stenosis or dissection. Mild for age atheromatous change about the right carotid bulb without stenosis. Left carotid system: Left common and internal carotid arteries are patent without stenosis or dissection. Vertebral arteries: Both vertebral arteries arise from subclavian arteries. Vertebral arteries patent without significant stenosis or dissection. Skeleton: No worrisome osseous lesions. Other neck: No other  acute finding. 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 Emphysema noted. Coronary artery calcifications noted as well. Review of the MIP images confirms the above findings CTA HEAD FINDINGS Anterior circulation: Mild atheromatous change about the carotid siphons without hemodynamically significant stenosis. A1 segments patent bilaterally. Normal anterior communicating artery complex. Anterior cerebral arteries are patent without stenosis. No M1 stenosis or occlusion. Right MCA branches widely patent and well perfused. On the left, there is an acute proximal M3 stump occlusion, inferior division (series 9, image 137). Diminished arborization within the left MCA territory. Posterior circulation: Both V4 segments patent without significant stenosis. Neither PICA origin well visualized. Basilar patent without stenosis. Superior cerebellar and posterior cerebral arteries patent bilaterally. Venous sinuses: Bowel assessed due to timing the contrast bolus. Anatomic variants: None significant.  No aneurysm. Review of the MIP images confirms the above findings CT Brain Perfusion Findings: ASPECTS: 7 CBF (<30%) Volume: 20mL Perfusion (Tmax>6.0s) volume: 94mL Mismatch Volume: 74mL Infarction Location:Acute core infarct involving the posterior left frontoparietal region, posterior left MCA territory. 74 mL surrounding ischemic penumbra. IMPRESSION: CT HEAD: 1. Evolving cytotoxic edema involving the left insula and overlying left cerebral hemisphere, consistent with an evolving acute left MCA distribution infarct. No acute intracranial hemorrhage. 2. Aspects = 7. 3. Abnormal hyperdensity involving a proximal left MCA branch, concerning for thrombus. CTA HEAD AND NECK: 1. Acute proximal M3 stump occlusion, inferior division left MCA. 2. Otherwise negative CTA for large vessel occlusion or other emergent finding. 3. Mild for age atheromatous change about the carotid bifurcations and carotid siphons without hemodynamically  significant stenosis. 4. Aortic Atherosclerosis (ICD10-I70.0) and Emphysema (ICD10-J43.9). CT PERFUSION: 20 mL acute core infarct involving the posterior left frontoparietal region, posterior left MCA territory. 74 mL surrounding ischemic penumbra. Critical Value/emergent  results were called by telephone at the time of interpretation on 07/11/2023 at 11:01 pm to provider NEHA RAY , who verbally acknowledged these results. Electronically Signed   By: Virgia Griffins M.D.   On: 07/11/2023 23:17   CT ANGIO HEAD NECK W WO CM W PERF (CODE STROKE) Result Date: 07/11/2023 CLINICAL DATA:  Initial EXAM: CT ANGIOGRAPHY HEAD AND NECK CT PERFUSION BRAIN TECHNIQUE: Multidetector CT imaging of the head and neck was performed using the standard protocol during bolus administration of intravenous contrast. Multiplanar CT image reconstructions and MIPs were obtained to evaluate the vascular anatomy. Carotid stenosis measurements (when applicable) are obtained utilizing NASCET criteria, using the distal internal carotid diameter as the denominator. Multiphase CT imaging of the brain was performed following IV bolus contrast injection. Subsequent parametric perfusion maps were calculated using RAPID software. RADIATION DOSE REDUCTION: This exam was performed according to the departmental dose-optimization program which includes automated exposure control, adjustment of the mA and/or kV according to patient size and/or use of iterative reconstruction technique. CONTRAST:  OMNIPAQUE  IOHEXOL  350 MG/ML SOLN COMPARISON:  None available. FINDINGS: CT HEAD FINDINGS Brain: Evolving cytotoxic edema involving the left insula and posterior left cerebral hemisphere, consistent with an evolving left MCA distribution infarct. Mild patchy involvement of the anterior left frontal lobe as well. Sparing of the basal ganglia. No significant mass effect or midline shift. No hydrocephalus. No acute intracranial hemorrhage. No extra-axial  fluid collection. Vascular: Abnormal hyperdensity involving proximal left MCA branch, into and/or M3 segment, concerning for thrombus (series 4, image 13). Skull: Scalp soft tissues within normal limits.  Calvarium intact. Sinuses/Orbits: Globes orbital soft tissues within normal limits. Chronic mucosal thickening present about the right sphenoid sinus. Paranasal sinuses are otherwise clear. No mastoid effusion. Other: None. ASPECTS (Alberta Stroke Program Early CT Score) - Ganglionic level infarction (caudate, lentiform nuclei, internal capsule, insula, M1-M3 cortex): 6 - Supraganglionic infarction (M4-M6 cortex): 1 Total score (0-10 with 10 being normal): 7 Review of the MIP images confirms the above findings CTA NECK FINDINGS Aortic arch: Visualized aortic arch within normal limits for caliber standard branch pattern. Mild aortic atherosclerosis. No significant stenosis about the origin of the great vessels. Right carotid system: Right common and internal carotid arteries are patent without stenosis or dissection. Mild for age atheromatous change about the right carotid bulb without stenosis. Left carotid system: Left common and internal carotid arteries are patent without stenosis or dissection. Vertebral arteries: Both vertebral arteries arise from subclavian arteries. Vertebral arteries patent without significant stenosis or dissection. Skeleton: No worrisome osseous lesions. Other neck: No other acute finding. 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 Emphysema noted. Coronary artery calcifications noted as well. Review of the MIP images confirms the above findings CTA HEAD FINDINGS Anterior circulation: Mild atheromatous change about the carotid siphons without hemodynamically significant stenosis. A1 segments patent bilaterally. Normal anterior communicating artery complex. Anterior cerebral arteries are patent without stenosis. No M1 stenosis or occlusion. Right MCA branches widely patent and well  perfused. On the left, there is an acute proximal M3 stump occlusion, inferior division (series 9, image 137). Diminished arborization within the left MCA territory. Posterior circulation: Both V4 segments patent without significant stenosis. Neither PICA origin well visualized. Basilar patent without stenosis. Superior cerebellar and posterior cerebral arteries patent bilaterally. Venous sinuses: Bowel assessed due to timing the contrast bolus. Anatomic variants: None significant.  No aneurysm. Review of the MIP images confirms the above findings CT Brain Perfusion Findings: ASPECTS: 7 CBF (<30%) Volume:  20mL Perfusion (Tmax>6.0s) volume: 94mL Mismatch Volume: 74mL Infarction Location:Acute core infarct involving the posterior left frontoparietal region, posterior left MCA territory. 74 mL surrounding ischemic penumbra. IMPRESSION: CT HEAD: 1. Evolving cytotoxic edema involving the left insula and overlying left cerebral hemisphere, consistent with an evolving acute left MCA distribution infarct. No acute intracranial hemorrhage. 2. Aspects = 7. 3. Abnormal hyperdensity involving a proximal left MCA branch, concerning for thrombus. CTA HEAD AND NECK: 1. Acute proximal M3 stump occlusion, inferior division left MCA. 2. Otherwise negative CTA for large vessel occlusion or other emergent finding. 3. Mild for age atheromatous change about the carotid bifurcations and carotid siphons without hemodynamically significant stenosis. 4. Aortic Atherosclerosis (ICD10-I70.0) and Emphysema (ICD10-J43.9). CT PERFUSION: 20 mL acute core infarct involving the posterior left frontoparietal region, posterior left MCA territory. 74 mL surrounding ischemic penumbra. Critical Value/emergent results were called by telephone at the time of interpretation on 07/11/2023 at 11:01 pm to provider NEHA RAY , who verbally acknowledged these results. Electronically Signed   By: Virgia Griffins M.D.   On: 07/11/2023 23:17    PHYSICAL  EXAM Patient awake and alert. Able to mimic examiner at time. Speech is aphasic. He had difficulty naming, repeating and following commands. He has expressive aphasia. Extraocular movements intact. Visual fields full. Face symmetric. Tongue midline. Moves all extremities x 4. Strength normal. Coordination normal. Sensation intact. Heart rate regular. Breath sounds clear.   ASSESSMENT/PLAN [name] is a 68 y.o. male with history of PE, Atrial fibrillation on Eliquis , CHF, HTN, HLD, Chronic LBBB, PVD who is presenting with aphasia and found to have a left insular stroke..   Stroke:  left Insula  Etiology:  Cardioembolic in the setting of afib but cannot rule out large vessel disease   Code Stroke CT head with evolving cytotoxic edema in the left insula and posterior left cerebral hemisphere. Etiology likely cardioembolic in the setting of atrial fibrillation but cannot rule out large vessel disease. ASPECTS 7   CTA head & neck Acute proxima M3 occlusion  CT perfusion 20 mL acute core infarct involving the posterior left frontoparietal region, posterior left MCA territory. 74 mL surrounding ischemic penumbra Cerebral angio: Suction thrombectomy of left M3 branch occlusion which provide significant perfusion to the posterior left parietal lobe. Post treatment TICI score 2b Post IR CT Nonhemorrhagic left temporal and parietal infarct with expected evolution MRI  Pending  MRA  pending  2D Echo EF 25 to 30% LDL 44 HgbA1c 6.0 VTE prophylaxis - Restarted on full dose Eliquis      Diet   Diet regular Room service appropriate? Yes; Fluid consistency: Thin   Eliquis  (apixaban ) daily prior to admission, now on Eliquis  (apixaban ) daily.  Therapy recommendations:  pending  Disposition:  pending   Hypertension Home meds:  Valsartan  Stable Permissive hypertension (OK if < 220/120) but gradually normalize in 5-7 days Long-term BP goal normotensive  Hyperlipidemia Home meds:  Atorvastatin,  resumed in hospital LDL 44, goal < 70  Continue statin at discharge  Diabetes type II Controlled Home meds:  Metformin  HgbA1c 6.0, goal < 7.0 CBGs Recent Labs    07/11/23 2234  GLUCAP 112*    SSI  Other Stroke Risk Factors Advanced Age >/= 18  Cigarette smoker advised to stop smoking Obesity, Body mass index is 35.19 kg/m., BMI >/= 30 associated with increased stroke risk, recommend weight loss, diet and exercise as appropriate  Hx stroke/TIA Coronary artery disease Congestive heart failure  Other Active Problems  CHF    Hospital day # 0  I have personally obtained history,examined this patient, reviewed notes, independently viewed imaging studies, participated in medical decision making and plan of care.ROS completed by me personally and pertinent positives fully documented    This patient is critically ill due to Left MCA stroke s/p thrombectomy with TICI 2b reperfusion and at significant risk of neurological worsening, death form evolving stroke, hemorrhage or heart failure. This patient's care requires constant monitoring of vital signs, hemodynamics, respiratory and cardiac monitoring, review of multiple databases, neurological assessment, discussion with family, other specialists and medical decision making of high complexity. I spent 40 minutes of neurocritical care time in the care of this patient.    Cassandra Cleveland, MD Neurology  07/12/2023 2:16 PM   To contact Stroke Continuity provider, please refer to [email or web address]. After hours, contact General Neurology

## 2023-07-12 NOTE — Anesthesia Procedure Notes (Signed)
 Arterial Line Insertion Start/End6/14/2025 12:51 AM Performed by: Jake Mayers, MD, anesthesiologist  Patient location: OOR procedure area. Preanesthetic checklist: patient identified, IV checked, site marked, risks and benefits discussed, surgical consent, monitors and equipment checked, pre-op evaluation, timeout performed and anesthesia consent Left, radial was placed Catheter size: 20 G Maximum sterile barriers used   Attempts: 2 Procedure performed without using ultrasound guided technique. Following insertion, dressing applied and Biopatch. Post procedure assessment: normal  Patient tolerated the procedure well with no immediate complications.

## 2023-07-12 NOTE — Evaluation (Signed)
 Speech Language Pathology Evaluation Patient Details Name: Henry Schneider MRN: [id number] DOB: 11/17/1955 Today's Date: 07/12/2023 Time: 4098-1191 SLP Time Calculation (min) (ACUTE ONLY): 33 min  Problem List:  Patient Active Problem List   Diagnosis Date Noted   Systolic heart failure (HCC) 12/20/2014   Pulmonary emboli (HCC) 12/19/2014   Past Medical History: No past medical history on file. Past Surgical History:  Past Surgical History:  Procedure Laterality Date   IR CT HEAD LTD  07/12/2023   IR PERCUTANEOUS ART THROMBECTOMY/INFUSION INTRACRANIAL INC DIAG ANGIO  07/12/2023   IR US  GUIDE VASC ACCESS RIGHT  07/12/2023   HPI:  Pt is a 68 y.o. male presented to [facility] via private vehicle for evaluation of altered mental status.  He walked into the ER lobby and said something is wrong but could not really have much further conversation.  ED provider evaluation was concerning for aphasia.  Code stroke was activated. CT head (07/12/23) revealed Nonhemorrhagic left temporal and parietal infarct. He underwent thrombectomy 6/14. PMH:  PE on Eliquis , possibly history of A-fib as well, history of systolic CHF/NICM with an EF of 15% in April 2017 with improvement in 2021 to 30% to 35%, status post [facility] CRT-D placement, HTN, HLD, chronic left bundle branch block, peripheral vascular disease, Buerger's disease, emphysema, tobacco abuse.   Assessment / Plan / Recommendation Clinical Impression  Pt presents with expressive and receptive aphasia s/p CVA. Western Aphasia Battery Bedside administered with the pt yielding a Bedside Language Score of 13.75/100. Spontaneous speech was often halting, perseverative, paraphasic, agrammatic with significant word finding difficulty, however he did have some more complete sentences such as whatever you need, help me will you?, and see I can't do it. Auditory comprehension of y/n questions is strong with basic biographical  information (such as his name, city he is from), otherwise were 0% accurate. When given a map, pt identiifed city of residence via pointing. He identified objects in a field of 2 with 75% accuracy and demonstrated use of these objects with 50% accuracy. He followed very simple 1 step commands with 50% accuracy, which improved with visual/demonstration cues.  Repetition of words, reading and writing at the word level were poor (0%). Pt would greatly benefit from SLP intervention for receptive and expressive aphasia. Educated pt, niece (present for eval) and RN regarding use of simple y/n questions, written words and visuals as communciation strategies to assist during breakdowns in conversation or disussion of needs. Will f/u.    SLP Assessment  SLP Recommendation/Assessment: Patient needs continued Speech Language Pathology Services SLP Visit Diagnosis: Aphasia (R47.01)     Assistance Recommended at Discharge     Functional Status Assessment Patient has had a recent decline in their functional status and demonstrates the ability to make significant improvements in function in a reasonable and predictable amount of time.  Frequency and Duration min 2x/week  2 weeks      SLP Evaluation Cognition  Overall Cognitive Status: Difficult to assess Arousal/Alertness: Awake/alert Orientation Level:  (difficult to assess, says yes when asked if his name is Financial controller)       Comprehension  Auditory Comprehension Overall Auditory Comprehension: Impaired Yes/No Questions: Impaired Basic Biographical Questions: 76-100% accurate Basic Immediate Environment Questions: 0-24% accurate Complex Questions: 0-24% accurate Commands: Impaired One Step Basic Commands: 50-74% accurate Two Step Basic Commands: 0-24% accurate Multistep Basic Commands: 0-24% accurate Complex Commands: 0-24% accurate Conversation: Simple EffectiveTechniques: Repetition;Extra processing time Visual  Recognition/Discrimination Discrimination: Exceptions to [facility] Common Objects:  Able in field of 2 Reading Comprehension Reading Status: Impaired Word level: Impaired    Expression Expression Primary Mode of Expression: Verbal Verbal Expression Overall Verbal Expression: Impaired Initiation: Impaired Automatic Speech: Social Response Level of Generative/Spontaneous Verbalization: Phrase;Sentence Repetition: Impaired Level of Impairment: Word level Naming: Impairment Confrontation: Impaired Common Objects: Able in field of 2 Verbal Errors: Neologisms;Perseveration;Aware of errors Pragmatics: No impairment Effective Techniques: Written cues Non-Verbal Means of Communication: Gestures Written Expression Dominant Hand: Right Written Expression: Exceptions to [facility] Self Formulation Ability: Word   Oral / Motor  Oral Motor/Sensory Function Overall Oral Motor/Sensory Function: Within functional limits Motor Speech Overall Motor Speech: Appears within functional limits for tasks assessed             Gordon Latus, MA, CCC-SLP Acute Rehabilitation Services Office Number: [phone]  Corliss Dies 07/12/2023, 1:29 PM

## 2023-07-12 NOTE — Progress Notes (Signed)
 PHARMACY - ANTICOAGULATION CONSULT NOTE  Pharmacy Consult for apixaban  Indication: atrial fibrillation  No Known Allergies  Patient Measurements: Height: 6' 3 (190.5 cm) Weight: 127.7 kg (281 lb 8.4 oz) IBW/kg (Calculated) : 84.5 HEPARIN  DW (KG): 112.2  Vital Signs: Temp: 97.5 F (36.4 C) (06/14 1500) Temp Source: Axillary (06/14 1500) BP: 126/87 (06/14 1600) Pulse Rate: 56 (06/14 1600)  Labs: Recent Labs    07/11/23 2240  HGB 16.5  HCT 49.9  PLT 189  APTT 27  LABPROT 14.1  INR 1.1  CREATININE 1.13    Estimated Creatinine Clearance: 92.6 mL/min (by C-G formula based on SCr of 1.13 mg/dL).   Medical History: No past medical history on file.  Medications:  Scheduled:   [START ON 07/13/2023]  stroke: early stages of recovery book   Does not apply Once   apixaban   5 mg Oral BID   Chlorhexidine Gluconate Cloth  6 each Topical Daily    Assessment: 68 yo male w/ afib, now s/p TICI2B recanalization, repeat head CT this morning with no acute hemorrhage, just evolution of the stroke.  Pharmacy asked to start anticoagulation with apixaban .  Goal of Therapy:   Monitor platelets by anticoagulation protocol: Yes   Plan:  Start apixaban  5 mg po BID. Will need education prior to discharge.  Joanell Mowers, Davey Erp, BCCP Clinical Pharmacist  07/12/2023 4:28 PM   [facility] pharmacy phone numbers are listed on [email or web address]

## 2023-07-12 NOTE — Progress Notes (Signed)
 NIR Progress Note:   Postop Day #0 s/p Left M3 thrombectomy (TICI 2b)   Recommendations: Acute ischemic stroke: Considering timing of anticoagulation resumption based on clinical and CT findings. Routine postop stroke care and risk factor optimization. No activity restriction from femoral access standpoint. Appreciate excellent multidisciplinary care.  Please call with questions or concerns.   Overnight events: Awake and alert. Dysphasic, but communicating effectively. Echo in process.   Subjective: No complaints of pain or headache   Objective: Physical Exam:   Dysphasic.  Answering some simple questions effectively. No motor deficits Femoral access site C/D/I and without hematoma   Studies/Results: CT 07/11/23 demonstrates:  1. Nonhemorrhagic left temporal and parietal infarct with expected evolution. 2. Developing mass effect, effacement of the sulci, and 1 to 2 mm midline shift is present. 3. No acute hemorrhage or significant expansion of the infarct.   LOS: 1 day    I spent a total of 15 minutes in face to face in clinical consultation, greater than 50% of which was counseling/coordinating care.

## 2023-07-12 NOTE — Anesthesia Procedure Notes (Addendum)
 Arterial Line Insertion Start/End6/14/2025 12:50 AM, 07/12/2023 12:52 AM Performed by: Jake Mayers, MD, anesthesiologist  Patient location: OR. Preanesthetic checklist: patient identified, IV checked, site marked, risks and benefits discussed, surgical consent, monitors and equipment checked, pre-op evaluation, timeout performed and anesthesia consent Lidocaine 1% used for infiltration Left, radial was placed Catheter size: 20 G Hand hygiene performed , maximum sterile barriers used  and Seldinger technique used  Attempts: 1 Procedure performed without using ultrasound guided technique. Following insertion, dressing applied and Biopatch. Post procedure assessment: normal and unchanged  Post procedure complications: unsuccessful attempts and second provider assisted. Patient tolerated the procedure well with no immediate complications.

## 2023-07-12 NOTE — Progress Notes (Signed)
 eLink Physician-Brief Progress Note Patient Name: Henry Schneider DOB: [birth year] MRN: [id number]   Date of Service  07/12/2023  HPI/Events of Note  Patient admitted with left MCA territory ischemic stroke  and s/p thrombectomy admitted to the ICU for close neurological monitoring.  eICU Interventions  New Patient Evaluation.        Henry Schneider 07/12/2023, 3:58 AM

## 2023-07-12 NOTE — H&P (Addendum)
 NEUROLOGY H&P NOTE   Date of service: July 12, 2023 Patient Name: Henry Schneider MRN:  [id number] DOB:  [birth year] Chief Complaint: Aphasia  History of Present Illness  Henry Schneider is a 68 y.o. male with hx of PE on Eliquis , possibly history of A-fib as well, history of systolic CHF/NICM with an EF of 15% in April 2017 with improvement in 2021 to 30% to 35%, status post [facility] CRT-D placement, HTN, HLD, chronic left bundle branch block, peripheral vascular disease, Buerger's disease, emphysema, tobacco abuse, presented to [facility] via private vehicle for evaluation of altered mental status.  He walked into the ER lobby and said something is wrong but could not really have much further conversation.  ED provider evaluation was concerning for aphasia.  Code stroke was activated.  Teleneurology MD evaluated the patient.  CT head showed evolving cytotoxic edema involving left insula and overlying left cerebral hemisphere consistent with evolving acute left MCA distribution infarct, aspects score 7, abnormal hyperdense proximal left MCA branch concerning for thrombus.  CT angiography head and neck with acute proximal M3 stump occlusion, inferior division of the left MCA, otherwise negative.  Perfusion profile favorable-see below. After discussion-documented below in detail-with patient's brother, the neurologist and neurointerventional radiologist, he was brought in for cerebral angiogram with intent for thrombectomy if amenable.  Patient is unable to provide any history   Last known well: Unclear-family last spoke with him many days ago but he walked into the ER by himself and was unable to provide a clear last known well Modified rankin score: 1-No significant post stroke disability and can perform usual duties with stroke symptoms-mostly due to cardiac and PVD reasons-brother reported he gets short of breath with short distances but still lives independently, takes care of  his bills, mows his own lawn etc.  Moved down from the [place]  area here to live independently. IV Thrombolysis: On Eliquis  with unknown timing of last known dose, unknown last known well Thrombectomy: Yes-see detailed discussion below NIHSS components Score: Comment  1a Level of Conscious 0[x]  1[]  2[]  3[]      1b LOC Questions 0[]  1[]  2[x]       1c LOC Commands 0[]  1[x]  2[]       2 Best Gaze 0[x]  1[]  2[]       3 Visual 0[x]  1[]  2[]  3[]      4 Facial Palsy 0[x]  1[]  2[]  3[]      5a Motor Arm - left 0[x]  1[]  2[]  3[]  4[]  UN[]    5b Motor Arm - Right 0[x]  1[]  2[]  3[]  4[]  UN[]    6a Motor Leg - Left 0[x]  1[]  2[]  3[]  4[]  UN[]    6b Motor Leg - Right 0[x]  1[]  2[]  3[]  4[]  UN[]    7 Limb Ataxia 0[x]  1[]  2[]  UN[]      8 Sensory 0[x]  1[]  2[]  UN[]      9 Best Language 0[]  1[]  2[x]  3[]      10 Dysarthria 0[x]  1[]  2[]  UN[]      11 Extinct. and Inattention 0[x]  1[]  2[]       TOTAL: 5      ROS  Unable to perform due to aphasia  Past History  No past medical history on file. No past surgical history on file. Family History  Problem Relation Age of Onset   Coronary artery disease Mother    Coronary artery disease Father    Social History   Socioeconomic History   Marital status: Single    Spouse name: Not on file   Number of  children: Not on file   Years of education: Not on file   Highest education level: Not on file  Occupational History   Not on file  Tobacco Use   Smoking status: Every Day    Current packs/day: 1.00    Types: Cigarettes   Smokeless tobacco: Not on file  Substance and Sexual Activity   Alcohol use: Yes    Alcohol/week: 1.0 standard drink of alcohol    Types: 1 Cans of beer per week   Drug use: Not on file   Sexual activity: Yes  Other Topics Concern   Not on file  Social History Narrative   Not on file   Social Drivers of Health   Financial Resource Strain: Not on file  Food Insecurity: Not on file  Transportation Needs: Not on file  Physical Activity:  Not on file  Stress: Not on file  Social Connections: Not on file   No Known Allergies  Medications   Current Facility-Administered Medications:    [START ON 07/13/2023]  stroke: early stages of recovery book, , Does not apply, Once, Tona Francis, MD   0.9 %  sodium chloride  infusion, , Intravenous, Continuous, Silvanna Ohmer, MD   acetaminophen  (TYLENOL ) tablet 650 mg, 650 mg, Oral, Q4H PRN **OR** acetaminophen  (TYLENOL ) 160 MG/5ML solution 650 mg, 650 mg, Per Tube, Q4H PRN **OR** acetaminophen  (TYLENOL ) suppository 650 mg, 650 mg, Rectal, Q4H PRN, Viera Okonski, MD   senna-docusate (Senokot-S) tablet 1 tablet, 1 tablet, Oral, QHS PRN, Henley Blyth, MD   Vitals   There were no vitals filed for this visit.   There is no height or weight on file to calculate BMI.  Physical Exam   General: Awake alert in no distress HEENT: Normocephalic/atraumatic Lungs: Clear Cardiovascular: Regular rhythm Neurological exam Awake alert Complete word salad Unable to follow verbal commands but was able to mimic closing his eyes, sticking his tongue out and making a fist. Cranial nerves II to XII with no deficits noted Motor examination with no deficits-no drift in any of the 4 extremities. Sensation appears intact to light touch Coordination examination difficult to assess but no gross dysmetria noted   Labs   CBC:  Recent Labs  Lab 07/11/23 2240  WBC 13.9*  NEUTROABS 9.2*  HGB 16.5  HCT 49.9  MCV 95.2  PLT 189   Basic Metabolic Panel:  Lab Results  Component Value Date   NA 138 07/11/2023   K 4.3 07/11/2023   CO2 21 (L) 07/11/2023   GLUCOSE 106 (H) 07/11/2023   BUN 27 (H) 07/11/2023   CREATININE 1.13 07/11/2023   CALCIUM 9.0 07/11/2023   GFRNONAA >60 07/11/2023   GFRAA >60 12/20/2014   Alcohol Level     Component Value Date/Time   ETH <15 07/11/2023 2240   INR  Lab Results  Component Value Date   INR 1.1 07/11/2023   APTT  Lab Results  Component Value Date    APTT 27 07/11/2023   CT Head without contrast, CT angiography head and neck, CT perfusion study (Personally reviewed): CT head showed evolving cytotoxic edema involving left insula and overlying left cerebral hemisphere consistent with evolving acute left MCA distribution infarct, aspects score 7, abnormal hyperdense proximal left MCA branch concerning for thrombus.  CT angiography head and neck with acute proximal M3 stump occlusion, inferior division of the left MCA, otherwise negative. CT perfusion study with core 20 cc, Tmax greater than 6 of 94 cc with a mismatch volume 74 cc  Assessment  Henry Schneider is a 68 y.o. male with significant cerebrovascular risk factors including history of PE, possible A-fib, on Eliquis  unclear when he took his last dose, history of systolic CHF/NICM with EF as low as 15% in April 2017 with improvement to 30 to 35% status post [facility] [employer] CRT-D placement, HTN, HLD, chronic LBBB, peripheral vascular disease, emphysema and tobacco abuse presented to [facility] for evaluation of not feeling well.  Noted to have aphasia on exam.  Telestroke evaluation concerning for a left hemispheric stroke.  CT head with evolving cytotoxic edema in the left insula and overlying left cerebral hemisphere consistent with acute left MCA infarct with aspect score of 7 and abnormal hyperdense proximal left MCA branch concerning for thrombus.  CT angiography confirms left MCA thrombus likely in the M3 inferior division of the left MCA.  CT perfusion study with favorable profile-core 20 cc, penumbra 74 cc.  Challenging case due to unclear last known well, unclear timing of Eliquis  dose, core of 20 cc already established in the left MCA territory and a thrombus that is either distal left M2 or proximal left M3 which is somewhat further than the usual standard of care cases. Detailed discussion over the phone with brother Henry Schneider, who alluded to the fact that the patient would want  to be as independent possible and even with higher chances of bleed because of last known well unknown, could presumably be greater than 24 hours as well, and the fact that he is on Eliquis , he still would like to proceed with the angiogram with the intent to treat with thrombectomy. Appreciate Dr. Kittie Perking being on the call and explaining the risks benefits and alternatives of the procedure as well. 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 Post thrombectomy care per IR Admit to neuro ICU Frequent neurochecks per post thrombectomy vitals and neurochecks protocol Will hold off on anticoagulation until repeat imaging is done.  May need to start him on heparin  prior to resuming DOAC-decision based on further imaging as we go along. 2D echo A1c Lipid panel Therapy assessments N.p.o. until cleared by bedside swallow evaluation Gentle hydration-IV fluids-normal saline 50 cc an hour. Will avoid large boluses given history of systolic CHF. MRI when possible SCD for DVT prophylaxis  Brother indicated that the patient is likely a DNR but for the duration of the procedure, he will be a full code and we can change the CODE STATUS tomorrow when the brother arrives after detailed discussion with the brother and hopefully with the patient as he awakes after the procedure.  CODE STATUS: Full code ______________________________________________________________________   Leala Prince, MD [facility]  Imaging personally reviewed prior to thrombectomy.  Risks benefits and alternatives of the procedure discussed by the interventionalist with me witnessing the call on three-way phone call via  [facility].  Brother Henry Schneider, lives in [place] , will be making his way down here tomorrow.  Phone number [phone].  CRITICAL CARE ATTESTATION Performed by: Tona Francis, MD Total critical care time: 60 minutes Critical care time was exclusive of separately billable procedures and treating other patients and/or supervising APPs/Residents/Students Critical care was necessary to treat or prevent imminent or life-threatening deterioration. This patient is critically ill and at significant risk for neurological worsening and/or  death and care requires constant monitoring. Critical care was time spent personally by me on the following activities: development of treatment plan with patient and/or surrogate as well as nursing, discussions with consultants, evaluation of patient's response to treatment, examination of patient, obtaining history from patient or surrogate, ordering and performing treatments and interventions, ordering and review of laboratory studies, ordering and review of radiographic studies, pulse oximetry, re-evaluation of patient's condition, participation in multidisciplinary rounds and medical decision making of high complexity in the care of this patient.

## 2023-07-12 NOTE — Anesthesia Preprocedure Evaluation (Addendum)
 Anesthesia Evaluation  Patient identified by MRN, date of birth, ID band Patient confused    Reviewed: Allergy & Precautions, H&P , NPO status , Patient's Chart, lab work & pertinent test results  Airway Mallampati: III  TM Distance: >3 FB Neck ROM: Full    Dental no notable dental hx. (+) Teeth Intact, Dental Advisory Given   Pulmonary Current Smoker   Pulmonary exam normal breath sounds clear to auscultation       Cardiovascular +CHF  negative cardio ROS  Rhythm:Irregular Rate:Normal     Neuro/Psych negative neurological ROS  negative psych ROS   GI/Hepatic negative GI ROS, Neg liver ROS,,,  Endo/Other  negative endocrine ROS    Renal/GU negative Renal ROS  negative genitourinary   Musculoskeletal   Abdominal   Peds  Hematology negative hematology ROS (+)   Anesthesia Other Findings   Reproductive/Obstetrics negative OB ROS                             Anesthesia Physical Anesthesia Plan  ASA: 4 and emergent  Anesthesia Plan: General   Post-op Pain Management: Minimal or no pain anticipated   Induction: Intravenous  PONV Risk Score and Plan: 2 and Ondansetron , Dexamethasone and Treatment may vary due to age or medical condition  Airway Management Planned: Oral ETT  Additional Equipment: Arterial line  Intra-op Plan:   Post-operative Plan: Possible Post-op intubation/ventilation and Extubation in OR  Informed Consent: I have reviewed the patients History and Physical, chart, labs and discussed the procedure including the risks, benefits and alternatives for the proposed anesthesia with the patient or authorized representative who has indicated his/her understanding and acceptance.     Dental advisory given  Plan Discussed with: CRNA  Anesthesia Plan Comments:        Anesthesia Quick Evaluation

## 2023-07-12 NOTE — Consult Note (Deleted)
 error

## 2023-07-12 NOTE — Progress Notes (Signed)
 Echocardiogram 2D Echocardiogram has been performed.  Farley Honer, RDCS 07/12/2023, 9:52 AM

## 2023-07-12 NOTE — Progress Notes (Signed)
 2243 elerted for code stroke, EDP at bedside, pt in CT scanner already at time of elert, walked into ER with word salad but saying somethings not right  2252 TS paged  2259 back from CT  2304 TSMD on camera  2315 Zalewski MD calling [facility] to discuss pt as code IA, see neuro note for discussion with NIR  2322 CT results to Zalewski MD with response at this time  2329 brother called by this RN, states unable to give LKW because he hasnt talked to patient today and pt lives alone

## 2023-07-12 NOTE — Progress Notes (Signed)
 Inpatient Rehab Admissions Coordinator Note:   Per therapy patient was screened for CIR candidacy by Soma Bachand Annell Barrow, CCC-SLP. At this time, pt appears to be a potential candidate for CIR. I will place an order for rehab consult for full assessment, per our protocol.  Please contact me any with questions.Artemus Larsen, MS, CCC-SLP Admissions Coordinator [phone] 07/12/23 5:51 PM

## 2023-07-12 NOTE — ED Notes (Signed)
 EMTALA reviewed by this RN.

## 2023-07-13 LAB — HIV ANTIBODY (ROUTINE TESTING W REFLEX): HIV Screen 4th Generation wRfx: NONREACTIVE

## 2023-07-13 NOTE — Progress Notes (Signed)
 STROKE TEAM PROGRESS NOTE   INTERVAL HISTORY Patient seen and evaluated this morning, brother at bedside. No acute overnight events noted. He is status post TICI2B recanalization, repeat head CT 6/14 with no acute hemorrhage, just evolution of the stroke. He was restarted on Eliquis  and transferred out of the ICU.    Vitals:   07/12/23 2335 07/13/23 0353 07/13/23 0819 07/13/23 1147  BP: 113/73 116/74 96/73 119/76  Pulse: 67 67 (!) 59 65  Resp: 18 18 18 18   Temp: 98.2 F (36.8 C) 98.2 F (36.8 C) 97.7 F (36.5 C) 98.3 F (36.8 C)  TempSrc: Oral Oral Oral Oral  SpO2: 95% 95% 96% 96%  Weight:      Height:       CBC:  Recent Labs  Lab 07/11/23 2240  WBC 13.9*  NEUTROABS 9.2*  HGB 16.5  HCT 49.9  MCV 95.2  PLT 189   Basic Metabolic Panel:  Recent Labs  Lab 07/11/23 2240  NA 138  K 4.3  CL 110  CO2 21*  GLUCOSE 106*  BUN 27*  CREATININE 1.13  CALCIUM 9.0   Lipid Panel:  Recent Labs  Lab 07/12/23 0705  CHOL 95  TRIG 66  HDL 38*  CHOLHDL 2.5  VLDL 13  LDLCALC 44   HgbA1c:  Recent Labs  Lab 07/12/23 0704  HGBA1C 6.0*   Urine Drug Screen: No results for input(s): LABOPIA, COCAINSCRNUR, LABBENZ, AMPHETMU, THCU, LABBARB in the last 168 hours.  Alcohol Level  Recent Labs  Lab 07/11/23 2240  ETH <15    IMAGING past 24 hours No results found.   PHYSICAL EXAM Patient awake and alert. Able to mimic examiner at time. Speech is aphasic. He had difficulty naming, repeating and following commands. He has expressive aphasia. Extraocular movements intact. Visual fields full. Face symmetric. Tongue midline. Moves all extremities x 4. Strength normal. Coordination normal. Sensation intact. Heart rate regular. Breath sounds clear.   ASSESSMENT/PLAN [name] is a 68 y.o. male with history of PE, Atrial fibrillation on Eliquis , CHF, HTN, HLD, Chronic LBBB, PVD who is presenting with aphasia and found to have a left insular stroke..   Stroke:   left Insula, left temporal and parietal regions Etiology:  Cardioembolic in the setting of afib but cannot rule out large vessel disease   Code Stroke CT head with evolving cytotoxic edema in the left insula and posterior left cerebral hemisphere. Etiology likely cardioembolic in the setting of atrial fibrillation but cannot rule out large vessel disease. ASPECTS 7   CTA head & neck Acute proxima M3 occlusion  CT perfusion 20 mL acute core infarct involving the posterior left frontoparietal region, posterior left MCA territory. 74 mL surrounding ischemic penumbra Cerebral angio: Suction thrombectomy of left M3 branch occlusion which provide significant perfusion to the posterior left parietal lobe. Post treatment TICI score 2b Post IR CT Nonhemorrhagic left temporal and parietal infarct with expected evolution MRI  Pending  2D Echo EF 25 to 30% LDL 44 HgbA1c 6.0 VTE prophylaxis - Restarted on full dose Eliquis      Diet   Diet regular Room service appropriate? Yes; Fluid consistency: Thin   Eliquis  (apixaban ) daily prior to admission, now on Eliquis  (apixaban ) daily.  Therapy recommendations:  pending  Disposition:  probably CIR   Hypertension Home meds:  Valsartan  Stable Permissive hypertension (OK if < 220/120) but gradually normalize in 5-7 days Long-term BP goal normotensive  Hyperlipidemia Home meds:  Atorvastatin, resumed in hospital LDL 44,  goal < 70  Continue statin at discharge  Diabetes type II Controlled Home meds:  Metformin  HgbA1c 6.0, goal < 7.0 CBGs Recent Labs    07/11/23 2234  GLUCAP 112*    SSI  Other Stroke Risk Factors Advanced Age >/= 19  Cigarette smoker advised to stop smoking Obesity, Body mass index is 35.19 kg/m., BMI >/= 30 associated with increased stroke risk, recommend weight loss, diet and exercise as appropriate  Hx stroke/TIA Coronary artery disease Congestive heart failure  Other Active Problems CHF    Hospital day # 1   I  personally spent a total of 40 minutes in the care of the patient today including preparing to see the patient, getting/reviewing separately obtained history, performing a medically appropriate exam/evaluation, counseling and educating, placing orders, documenting clinical information in the EHR, independently interpreting results, communicating results, and updates brother, all questions answered and he voiced understanding.    Cassandra Cleveland, MD Neurology  07/13/2023 12:03 PM   To contact Stroke Continuity provider, please refer to [email or web address]. After hours, contact General Neurology

## 2023-07-13 NOTE — Plan of Care (Signed)
  Problem: Coping: Goal: Will verbalize positive feelings about self Outcome: Progressing   Problem: Education: Goal: Knowledge of disease or condition will improve Outcome: Progressing

## 2023-07-13 NOTE — Plan of Care (Signed)
  Problem: Education: Goal: Knowledge of disease or condition will improve 07/13/2023 1415 by Onesimo Bijou, RN Outcome: Progressing 07/13/2023 1414 by Onesimo Bijou, RN Outcome: Progressing   Problem: Coping: Goal: Will verbalize positive feelings about self Outcome: Progressing

## 2023-07-14 ENCOUNTER — Encounter (HOSPITAL_COMMUNITY): Payer: Self-pay | Admitting: Radiology

## 2023-07-14 ENCOUNTER — Inpatient Hospital Stay (HOSPITAL_COMMUNITY)

## 2023-07-14 DIAGNOSIS — I4891 Unspecified atrial fibrillation: Secondary | ICD-10-CM | POA: Diagnosis not present

## 2023-07-14 DIAGNOSIS — Z7901 Long term (current) use of anticoagulants: Secondary | ICD-10-CM

## 2023-07-14 DIAGNOSIS — G936 Cerebral edema: Secondary | ICD-10-CM | POA: Diagnosis not present

## 2023-07-14 DIAGNOSIS — E785 Hyperlipidemia, unspecified: Secondary | ICD-10-CM

## 2023-07-14 DIAGNOSIS — Z7984 Long term (current) use of oral hypoglycemic drugs: Secondary | ICD-10-CM

## 2023-07-14 DIAGNOSIS — I110 Hypertensive heart disease with heart failure: Secondary | ICD-10-CM | POA: Diagnosis not present

## 2023-07-14 DIAGNOSIS — E1151 Type 2 diabetes mellitus with diabetic peripheral angiopathy without gangrene: Secondary | ICD-10-CM

## 2023-07-14 DIAGNOSIS — I6340 Cerebral infarction due to embolism of unspecified cerebral artery: Secondary | ICD-10-CM | POA: Diagnosis not present

## 2023-07-14 DIAGNOSIS — F17210 Nicotine dependence, cigarettes, uncomplicated: Secondary | ICD-10-CM

## 2023-07-14 DIAGNOSIS — I509 Heart failure, unspecified: Secondary | ICD-10-CM

## 2023-07-14 NOTE — PMR Pre-admission (Signed)
 PMR Admission Coordinator Pre-Admission Assessment  Patient: Henry Schneider is an 68 y.o., male MRN: [id number] DOB: 1956/01/13 Height: 6' 3 (190.5 cm) Weight: 127.7 kg  Insurance Information HMO:     PPO:      PCP:      IPA:      80/20:      OTHER:  PRIMARY: Medicare A and B      Policy#: [id number]       Subscriber: PtCM Name:       Phone#:      Fax#:  Pre-Cert#: verified Health and safety inspector:  Benefits:  Phone #:      Name:  Eff. Date: a and B 01/28/2022    Deduct: $1632      Out of Pocket Max: n/a      Life Max: n/a CIR: 100%      SNF: 20 full days Outpatient:      Co-Pay:  Home Health: 100%      Co-Pay:  DME:      Co-Pay:  Providers:  SECONDARY: Cign[employer] Medicare Supplement      Policy#: ***     Phone#:   Artist:       Phone#:   The "Data Collection Information Summary" for patients in Inpatient Rehabilitation Facilities with attached "Privacy Act Statement-Health Care Records" was provided and verbally reviewed with: Patient  Emergency Contact Information Contact Information     Name Relation Home Work Mobile   Adrian Brother [phone]        Other Contacts   None on File     Current Medical History  Patient Admitting Diagnosis: CVA History of Present Illness: Henry Schneider is a 68 y.o. male with hx of PE on Eliquis , possibly history of A-fib as well, history of systolic CHF/NICM with an EF of 15% in April 2017 with improvement in 2021 to 30% to 35%, status post [facility] CRT-D placement, HTN, HLD, chronic left bundle branch block, peripheral vascular disease, Buerger's disease, emphysema, tobacco abuse,  On 07/12/23, Pt. Presented to [facility] for AMS. Code stroke was activated. Teleneurology MD evaluated the patient. CT head showed evolving cytotoxic edema involving left insula and overlying left cerebral hemisphere consistent with evolving acute left MCA distribution infarct, aspects score 7, abnormal hyperdense proximal  left MCA branch concerning for thrombus. CT angiography head and neck with acute proximal M3 stump occlusion, inferior division of the left MCA, otherwise negative. He was transferred to [facility] 07/12/23 for cerebral Angiogram and thrombectomy. He was seen by PT/OT/SLP and they recommended CIR to assist return to PLOF.   Complete NIHSS TOTAL: 2  Patient's medical record from [facility]  has been reviewed by the rehabilitation admission coordinator and physician.  Past Medical History  No past medical history on file.  Has the patient had major surgery during 100 days prior to admission? yes  Family History   family history includes Coronary artery disease in his father and mother.  Current Medications  Current Facility-Administered Medications:    acetaminophen  (TYLENOL ) tablet 650 mg, 650 mg, Oral, Q4H PRN, 650 mg at 07/13/23 0738 **OR** acetaminophen  (TYLENOL ) 160 MG/5ML solution 650 mg, 650 mg, Per Tube, Q4H PRN **OR** acetaminophen  (TYLENOL ) suppository 650 mg, 650 mg, Rectal, Q4H PRN, Arora, Ashish, MD   apixaban  (ELIQUIS ) tablet 5 mg, 5 mg, Oral, BID, Carney, Jessica C, RPH, 5 mg at 07/14/23 1051   Chlorhexidine Gluconate Cloth 2 % PADS 6  each, 6 each, Topical, Daily, Arora, Ashish, MD, 6 each at 07/12/23 1145   clevidipine (CLEVIPREX) infusion 0.5 mg/mL, 0-21 mg/hr, Intravenous, Continuous, Reagan Camera, MD   iohexol  (OMNIPAQUE ) 300 MG/ML solution 150 mL, 150 mL, Intra-arterial, Once PRN, Reagan Camera, MD   Oral care mouth rinse, 15 mL, Mouth Rinse, PRN, Arora, Ashish, MD   senna-docusate (Senokot-S) tablet 1 tablet, 1 tablet, Oral, QHS PRN, Arora, Ashish, MD  Patients Current Diet:  Diet Order             Diet regular Room service appropriate? Yes; Fluid consistency: Thin  Diet effective now                   Precautions / Restrictions Precautions Precautions: Fall, Other (comment) (aphasia) Restrictions Weight Bearing  Restrictions Per Provider Order: No   Has the patient had 2 or more falls or a fall with injury in the past year? No  Prior Activity Level Community (5-7x/wk): Pt. active int the community PTA  Prior Functional Level Self Care: Did the patient need help bathing, dressing, using the toilet or eating? Independent  Indoor Mobility: Did the patient need assistance with walking from room to room (with or without device)? Independent  Stairs: Did the patient need assistance with internal or external stairs (with or without device)? Independent  Functional Cognition: Did the patient need help planning regular tasks such as shopping or remembering to take medications? Independent  Patient Information Are you of Hispanic, Latino/a,or Spanish origin?: A. No, not of Hispanic, Latino/a, or Spanish origin What is your race?: A. White Do you need or want an interpreter to communicate with a doctor or health care staff?: 0. No  Patient's Response To:  Health Literacy and Transportation Is the patient able to respond to health literacy and transportation needs?: Yes Health Literacy - How often do you need to have someone help you when you read instructions, pamphlets, or other written material from your doctor or pharmacy?: Never In the past 12 months, has lack of transportation kept you from medical appointments or from getting medications?: No In the past 12 months, has lack of transportation kept you from meetings, work, or from getting things needed for daily living?: No  Home Assistive Devices / Equipment    Prior Device Use: Indicate devices/aids used by the patient prior to current illness, exacerbation or injury? None of the above  Current Functional Level Cognition  Arousal/Alertness: Awake/alert Overall Cognitive Status: Difficult to assess Orientation Level: Oriented to person    Extremity Assessment (includes Sensation/Coordination)  Upper Extremity Assessment: Defer to OT  evaluation  Lower Extremity Assessment: RLE deficits/detail RLE Deficits / Details: pt was slower or not pointing to R side to indicate he could feel light touch as he had done quickly and well for his L with his eyes closed, indicating potential sensation deficits, but difficult to fully assess due to language deficits; strength 5/5 grossly bil with MMT RLE Sensation: decreased light touch (questionable)    ADLs  Overall ADL's : Needs assistance/impaired Eating/Feeding: NPO Grooming: Minimal assistance, Standing Grooming Details (indicate cue type and reason): cues for sequence Lower Body Dressing: Set up, Sitting/lateral leans, Contact guard assist Lower Body Dressing Details (indicate cue type and reason): for socks Functional mobility during ADLs: Contact guard assist    Mobility  Overal bed mobility: Needs Assistance Bed Mobility: Supine to Sit Supine to sit: Supervision, HOB elevated General bed mobility comments: Supervision for safety transitioning supine to sit L EOB  with HOB elevated, cuing pt through gestures and stating up to perform transition.    Transfers  Overall transfer level: Needs assistance Equipment used: None Transfers: Sit to/from Stand Sit to Stand: Contact guard assist General transfer comment: CGA for safety standing from EOB    Ambulation / Gait / Stairs / Wheelchair Mobility  Ambulation/Gait Ambulation/Gait assistance: Contact guard assist, Min assist Gait Distance (Feet): 140 Feet Assistive device: None Gait Pattern/deviations: Step-through pattern, Decreased stride length, Drifts right/left General Gait Details: Pt tends to veer/sway to his R, intermittently needing minA for balance or to avoid obstacles/objects for his safety. He did bump into obstacles a couple times on the R. Sway noted with simulating stepping over obstacles and with head turns/nods. Gait velocity: reduced Gait velocity interpretation: 1.31 - 2.62 ft/sec, indicative of limited  community ambulator    Posture / Balance Balance Overall balance assessment: Mild deficits observed, not formally tested Standing balance support: No upper extremity supported Standing balance-Leahy Scale: Fair    Special needs/care consideration Special service needs none   Previous Home Environment (from acute therapy documentation) Living Arrangements: Alone  Lives With: Alone Available Help at Discharge: Family Type of Home: House Home Layout: One level Home Access: Stairs to enter Entrance Stairs-Rails: Right Entrance Stairs-Number of Steps: 3 Bathroom Shower/Tub: Engineer, manufacturing systems: Standard Bathroom Accessibility: Yes How Accessible: Accessible via walker Home Care Services: No Additional Comments: family lives in [place]  or [place] and pt here alone. Niece present in room was unable to answer questions about home set-up (per OT eval; no family present during PT Eval)  Discharge Living Setting Plans for Discharge Living Setting: House Type of Home at Discharge: House Discharge Home Layout: Able to live on main level with bedroom/bathroom Discharge Home Access: Stairs to enter Entrance Stairs-Rails: Right Entrance Stairs-Number of Steps: 3 Discharge Bathroom Shower/Tub: Tub/shower unit Discharge Bathroom Toilet: Standard Discharge Bathroom Accessibility: Yes How Accessible: Accessible via walker  Social/Family/Support Systems Patient Roles: Spouse Contact Information: [phone] Anticipated Caregiver: Wilhelmina Harari Ability/Limitations of Caregiver: 24/7Supevision to min A Caregiver Availability: 24/7 Discharge Plan Discussed with Primary Caregiver: Yes Is Caregiver In Agreement with Plan?: Yes Does Caregiver/Family have Issues with Lodging/Transportation while Pt is in Rehab?: No  Goals Patient/Family Goal for Rehab: PT/OT/SLP Supervision Expected length of stay: 5-7 days Pt/Family Agrees to Admission and willing to participate: Yes Program Orientation  Provided & Reviewed with Pt/Caregiver Including Roles  & Responsibilities: Yes  Decrease burden of Care through IP rehab admission: Not anticipated  Possible need for SNF placement upon discharge: not anticipated  Patient Condition: I have reviewed medical records from [facility], spoken with CM, and patient and family member. I met with patient at the bedside for inpatient rehabilitation assessment.  Patient will benefit from ongoing PT, OT, and SLP, can actively participate in 3 hours of therapy a day 5 days of the week, and can make measurable gains during the admission.  Patient will also benefit from the coordinated team approach during an Inpatient Acute Rehabilitation admission.  The patient will receive intensive therapy as well as Rehabilitation physician, nursing, social worker, and care management interventions.  Due to safety, skin/wound care, disease management, medication administration, pain management, and patient education the patient requires 24 hour a day rehabilitation nursing.  The patient is currently *** with mobility and basic ADLs.  Discharge setting and therapy post discharge at home with home health is anticipated.  Patient has agreed to participate in the Acute Inpatient Rehabilitation Program  and will admit {Time; today/tomorrow:10263}.  Preadmission Screen Completed By:  Dorena Gander, 07/14/2023 2:32 PM ______________________________________________________________________   Discussed status with Dr. Aaron Aas on *** at *** and received approval for admission today.  Admission Coordinator:  Dorena Gander, CCC-SLP, time Aaron AasAlanna Hu ***   Assessment/Plan: Diagnosis: *** Does the need for close, 24 hr/day Medical supervision in concert with the patient's rehab needs make it unreasonable for this patient to be served in a less intensive setting? {yes_no_potentially:3041433} Co-Morbidities requiring supervision/potential complications: *** Due to {due  NG:2952841}, does the patient require 24 hr/day rehab nursing? {yes_no_potentially:3041433} Does the patient require coordinated care of a physician, rehab nurse, PT, OT, and SLP to address physical and functional deficits in the context of the above medical diagnosis(es)? {yes_no_potentially:3041433} Addressing deficits in the following areas: {deficits:3041436} Can the patient actively participate in an intensive therapy program of at least 3 hrs of therapy 5 days a week? {yes_no_potentially:3041433} The potential for patient to make measurable gains while on inpatient rehab is {potential:3041437} Anticipated functional outcomes upon discharge from inpatient rehab: {functional outcomes:304600100} PT, {functional outcomes:304600100} OT, {functional outcomes:304600100} SLP Estimated rehab length of stay to reach the above functional goals is: *** Anticipated discharge destination: {anticipated dc setting:21604} 10. Overall Rehab/Functional Prognosis: {potential:3041437}   MD Signature: ***

## 2023-07-14 NOTE — CV Procedure (Signed)
  Device system confirmed to be MRI conditional, with implant date > 6 weeks ago, and no evidence of abandoned or epicardial leads in review of most recent CXR  Device last cleared by EP Provider: Valeri Gate 07/14/23  Clearance is good through for 1 year as long as parameters remain stable at time of check. If pt undergoes a cardiac device procedure during that time, they should be re-cleared.   Tachy-therapies to be programmed off if applicable with device back to pre-MRI settings after completion of exam.  [employer] was available remotely to assist in programming recommendations.   Arlys Lamer, RT  07/14/2023 3:25 PM

## 2023-07-14 NOTE — Plan of Care (Signed)
  Problem: Education: Goal: Knowledge of disease or condition will improve Outcome: Progressing   Problem: Ischemic Stroke/TIA Tissue Perfusion: Goal: Complications of ischemic stroke/TIA will be minimized Outcome: Progressing   Problem: Health Behavior/Discharge Planning: Goal: Ability to manage health-related needs will improve Outcome: Progressing   Problem: Self-Care: Goal: Ability to communicate needs accurately will improve Outcome: Progressing   Problem: Clinical Measurements: Goal: Will remain free from infection Outcome: Progressing   Problem: Coping: Goal: Level of anxiety will decrease Outcome: Progressing   Problem: Safety: Goal: Ability to remain free from injury will improve Outcome: Progressing

## 2023-07-14 NOTE — Progress Notes (Signed)
 Speech Language Pathology Treatment: Cognitive-Linguistic  Patient Details Name: Henry Schneider MRN: [id number] DOB: 1955-11-21 Today's Date: 07/14/2023 Time: 1320-1340 SLP Time Calculation (min) (ACUTE ONLY): 20 min  Assessment / Plan / Recommendation Clinical Impression  Skilled therapy session focused on communication goals. Patient with much improved expressive and receptive language since evaluation. Expressive language is characterized by ability to independently verbalize name, age, and location as well as count 1-10 and name the days of the week. Patient with questionable motor speech + paraphasic errors during months of the year and confrontational naming, therefore benefited from matching item to written word (100% accuracy). Patient continues to be unable to repeat single words and functional phrases. Receptive language is remarkable for 100% accuracy during single step commands and 75% accuracy during 2 step commands. Patient benefited from written commands when he demonstrated difficulty understanding. Patient with 50% accuracy during simple yes/no questions. Patient is extremely motivated and would be a great candidate for CIR ST.     HPI HPI: Pt is a 68 y.o. male presented to [facility] via private vehicle for evaluation of altered mental status.  He walked into the ER lobby and said something is wrong but could not really have much further conversation.  ED provider evaluation was concerning for aphasia.  Code stroke was activated. CT head (07/12/23) revealed Nonhemorrhagic left temporal and parietal infarct. He underwent thrombectomy 6/14. PMH:  PE on Eliquis , possibly history of A-fib as well, history of systolic CHF/NICM with an EF of 15% in April 2017 with improvement in 2021 to 30% to 35%, status post [facility] CRT-D placement, HTN, HLD, chronic left bundle branch block, peripheral vascular disease, Buerger's disease, emphysema, tobacco abuse.      SLP  Plan  Continue with current plan of care          Oral care BID   Intermittent Supervision/Assistance Aphasia (R47.01);Apraxia (R48.2)     Continue with current plan of care     Henry Schneider M.A., CCC-SLP 07/14/2023, 1:41 PM

## 2023-07-14 NOTE — Progress Notes (Signed)
 Inpatient Rehab Admissions Coordinator:    I met with the Pt. And his family at bedside. He states interest and his brother states that he can take Pt. Back to [place] via car once CIR is complete and that he [facility]. He and his family can provide 24/7 assist at d/c. I will follow for potential CIR admit, likely tomorrow.   Wandalee Gust, MS, CCC-SLP Rehab Admissions Coordinator  [phone] (celll) [phone] (office)

## 2023-07-14 NOTE — Progress Notes (Addendum)
 STROKE TEAM PROGRESS NOTE    INTERVAL HISTORY  Brother is at bedside.  Stable neuroexam, he continues to have significant aphasia.  No acute events overnight.  Eliquis  restarted 6/15. PENDING MRI   Discharge planning for CIR.   Patient will move to [place]  with brothers after rehab stay for 24-hour assistance.  Vitals:   07/13/23 2332 07/14/23 0342 07/14/23 0752 07/14/23 1205  BP: 116/77 120/82 101/76 106/66  Pulse: 76 73 72 80  Resp:  18 14 18   Temp: 98.1 F (36.7 C) 98.1 F (36.7 C) 97.9 F (36.6 C) 98.2 F (36.8 C)  TempSrc: Oral Oral Oral Oral  SpO2: 96% 96% 97% 96%  Weight:      Height:       CBC:  Recent Labs  Lab 07/11/23 2240  WBC 13.9*  NEUTROABS 9.2*  HGB 16.5  HCT 49.9  MCV 95.2  PLT 189   Basic Metabolic Panel:  Recent Labs  Lab 07/11/23 2240  NA 138  K 4.3  CL 110  CO2 21*  GLUCOSE 106*  BUN 27*  CREATININE 1.13  CALCIUM 9.0   Lipid Panel:  Recent Labs  Lab 07/12/23 0705  CHOL 95  TRIG 66  HDL 38*  CHOLHDL 2.5  VLDL 13  LDLCALC 44   HgbA1c:  Recent Labs  Lab 07/12/23 0704  HGBA1C 6.0*   Urine Drug Screen: No results for input(s): LABOPIA, COCAINSCRNUR, LABBENZ, AMPHETMU, THCU, LABBARB in the last 168 hours.  Alcohol Level  Recent Labs  Lab 07/11/23 2240  ETH <15     PHYSICAL EXAM Patient awake and alert. Broca's aphasia present.  When asked his brother's name, he stated his own name for 1 and the other brother's name for the other.  He stated 2026 for the year, did attempt to say several answers and the word that he used was very close to the word he was trying to say.  Speech got more disorganized with paucity of speech as conversation continued.  CN II-XII grossly intact.  Antigravity strength in all 4 extremities with no focal weakness noted Sensation intact bilaterally Coordination and fine motor movements intact bilaterally   ASSESSMENT/PLAN Mr. Josey Forcier is a 68 y.o. male with history of  PE, Atrial fibrillation on Eliquis , CHF, HTN, HLD, Chronic LBBB, PVD who is presenting with aphasia and found to have a left insular stroke..   Stroke:  left Insula, left temporal and parietal regions Etiology:  Cardioembolic in the setting of afib but cannot rule out large vessel disease   Code Stroke CT head with evolving cytotoxic edema in the left insula and posterior left cerebral hemisphere. Etiology likely cardioembolic in the setting of atrial fibrillation but cannot rule out large vessel disease. ASPECTS 7   CTA head & neck Acute proxima M3 occlusion  CT perfusion 20 mL acute core infarct involving the posterior left frontoparietal region, posterior left MCA territory. 74 mL surrounding ischemic penumbra Cerebral angio: Suction thrombectomy of left M3 branch occlusion which provide significant perfusion to the posterior left parietal lobe. Post treatment TICI score 2b Post IR CT Nonhemorrhagic left temporal and parietal infarct with expected evolution MRI  Pending  2D Echo EF 25 to 30% LDL 44 HgbA1c 6.0 VTE prophylaxis - Restarted on full dose Eliquis      Diet   Diet regular Room service appropriate? Yes; Fluid consistency: Thin   Eliquis  (apixaban ) daily prior to admission, continue  Eliquis  (apixaban ) daily.  Therapy recommendations:  CIR Disposition:  CIR  Hypertension Home meds:  Valsartan  Stable Permissive hypertension (OK if < 220/120) but gradually normalize in 5-7 days Long-term BP goal normotensive  Hyperlipidemia Home meds:  Atorvastatin, resumed in hospital LDL 44, goal < 70  Continue statin at discharge  Diabetes type II Controlled Home meds:  Metformin  HgbA1c 6.0, goal < 7.0 CBGs Recent Labs    07/11/23 2234  GLUCAP 112*    SSI  Other Stroke Risk Factors Advanced Age >/= 55  Cigarette smoker advised to stop smoking Obesity, Body mass index is 35.19 kg/m., BMI >/= 30 associated with increased stroke risk, recommend weight loss, diet and exercise  as appropriate  Hx stroke/TIA Coronary artery disease Congestive heart failure  Other Active Problems CHF   Hospital day # 2   Pt seen by Neuro NP/APP with MD. Note/plan to be edited by MD as needed.    Audrene Lease, DNP, AGACNP-BC [facility] Please use AMION for contact information & EPIC for messaging.  I have personally obtained history,examined this patient, reviewed notes, independently viewed imaging studies, participated in medical decision making and plan of care.ROS completed by me personally and pertinent positives fully documented  I have made any additions or clarifications directly to the above note. Agree with note above.  Patient remains neurologically stable but with persistent aphasia.  Continue Eliquis  for stroke prevention and maintain aggressive risk factor modification.  Transfer to inpatient rehab when bed available.  Discussed with patient and brother and answered questions.  Greater than 50% time during this 35-minute visit was spent in counseling and coordination of care and discussion with patient and care team and answering questions.  Ardella Beaver, MD Medical Director [facility] Pager: [phone] 07/14/2023 5:05 PM

## 2023-07-14 NOTE — TOC CM/SW Note (Signed)
 Transition of Care [facility]) - Inpatient Brief Assessment   Patient Details  Name: Henry Schneider MRN: [id number] Date of Birth: 1955/06/07  Transition of [facility] CM/SW Contact:    Jonathan Neighbor, RN Phone Number: 07/14/2023, 12:53 PM   Clinical Narrative:  Plan if for CIR potentially tomorrow. Pt has 24/7 support after a rehab stay. TOC following.  Transition of Care Asessment: Insurance and Status: Insurance coverage has been reviewed Patient has primary care physician: No Home environment has been reviewed: home alone   Prior/Current Home Services: No current home services Social Drivers of Health Review: SDOH reviewed no interventions necessary Readmission risk has been reviewed: Yes Transition of care needs: transition of care needs identified, TOC will continue to follow

## 2023-07-15 ENCOUNTER — Other Ambulatory Visit: Payer: Self-pay

## 2023-07-15 ENCOUNTER — Telehealth (HOSPITAL_COMMUNITY): Payer: Self-pay | Admitting: Pharmacy Technician

## 2023-07-15 ENCOUNTER — Other Ambulatory Visit (HOSPITAL_COMMUNITY): Payer: Self-pay

## 2023-07-15 ENCOUNTER — Inpatient Hospital Stay (HOSPITAL_COMMUNITY)
Admission: AD | Admit: 2023-07-15 | Discharge: 2023-07-19 | DRG: 057 | Disposition: A | Discharge status: Home / Self Care | Source: Intra-hospital | Attending: Physical Medicine & Rehabilitation | Admitting: Physical Medicine & Rehabilitation

## 2023-07-15 ENCOUNTER — Encounter (HOSPITAL_COMMUNITY): Payer: Self-pay | Admitting: Physical Medicine & Rehabilitation

## 2023-07-15 DIAGNOSIS — I428 Other cardiomyopathies: Secondary | ICD-10-CM | POA: Diagnosis present

## 2023-07-15 DIAGNOSIS — E1151 Type 2 diabetes mellitus with diabetic peripheral angiopathy without gangrene: Secondary | ICD-10-CM | POA: Diagnosis present

## 2023-07-15 DIAGNOSIS — I63312 Cerebral infarction due to thrombosis of left middle cerebral artery: Secondary | ICD-10-CM

## 2023-07-15 DIAGNOSIS — Z79899 Other long term (current) drug therapy: Secondary | ICD-10-CM

## 2023-07-15 DIAGNOSIS — I4891 Unspecified atrial fibrillation: Secondary | ICD-10-CM | POA: Diagnosis present

## 2023-07-15 DIAGNOSIS — Z8249 Family history of ischemic heart disease and other diseases of the circulatory system: Secondary | ICD-10-CM | POA: Diagnosis not present

## 2023-07-15 DIAGNOSIS — I110 Hypertensive heart disease with heart failure: Secondary | ICD-10-CM | POA: Diagnosis present

## 2023-07-15 DIAGNOSIS — E119 Type 2 diabetes mellitus without complications: Secondary | ICD-10-CM | POA: Diagnosis not present

## 2023-07-15 DIAGNOSIS — I63512 Cerebral infarction due to unspecified occlusion or stenosis of left middle cerebral artery: Secondary | ICD-10-CM

## 2023-07-15 DIAGNOSIS — K5901 Slow transit constipation: Secondary | ICD-10-CM

## 2023-07-15 DIAGNOSIS — I69320 Aphasia following cerebral infarction: Principal | ICD-10-CM

## 2023-07-15 DIAGNOSIS — I69319 Unspecified symptoms and signs involving cognitive functions following cerebral infarction: Secondary | ICD-10-CM

## 2023-07-15 DIAGNOSIS — D72829 Elevated white blood cell count, unspecified: Secondary | ICD-10-CM | POA: Diagnosis present

## 2023-07-15 DIAGNOSIS — R29701 NIHSS score 1: Secondary | ICD-10-CM | POA: Diagnosis not present

## 2023-07-15 DIAGNOSIS — J439 Emphysema, unspecified: Secondary | ICD-10-CM | POA: Diagnosis present

## 2023-07-15 DIAGNOSIS — I5022 Chronic systolic (congestive) heart failure: Secondary | ICD-10-CM | POA: Diagnosis present

## 2023-07-15 DIAGNOSIS — E785 Hyperlipidemia, unspecified: Secondary | ICD-10-CM | POA: Diagnosis present

## 2023-07-15 DIAGNOSIS — I69393 Ataxia following cerebral infarction: Secondary | ICD-10-CM

## 2023-07-15 DIAGNOSIS — I10 Essential (primary) hypertension: Secondary | ICD-10-CM

## 2023-07-15 DIAGNOSIS — F17210 Nicotine dependence, cigarettes, uncomplicated: Secondary | ICD-10-CM | POA: Diagnosis present

## 2023-07-15 DIAGNOSIS — I63412 Cerebral infarction due to embolism of left middle cerebral artery: Secondary | ICD-10-CM | POA: Diagnosis not present

## 2023-07-15 DIAGNOSIS — Z794 Long term (current) use of insulin: Secondary | ICD-10-CM

## 2023-07-15 DIAGNOSIS — K5900 Constipation, unspecified: Secondary | ICD-10-CM | POA: Diagnosis present

## 2023-07-15 DIAGNOSIS — Z7901 Long term (current) use of anticoagulants: Secondary | ICD-10-CM | POA: Diagnosis not present

## 2023-07-15 DIAGNOSIS — I69390 Apraxia following cerebral infarction: Secondary | ICD-10-CM | POA: Diagnosis not present

## 2023-07-15 DIAGNOSIS — Z7984 Long term (current) use of oral hypoglycemic drugs: Secondary | ICD-10-CM | POA: Diagnosis not present

## 2023-07-15 DIAGNOSIS — I639 Cerebral infarction, unspecified: Principal | ICD-10-CM | POA: Diagnosis present

## 2023-07-15 DIAGNOSIS — R739 Hyperglycemia, unspecified: Secondary | ICD-10-CM | POA: Diagnosis not present

## 2023-07-15 HISTORY — DX: Cerebral infarction due to unspecified occlusion or stenosis of left middle cerebral artery: I63.512

## 2023-07-15 MED ORDER — ACETAMINOPHEN 650 MG RE SUPP: 650.0000 mg | RECTAL | Status: DC | PRN

## 2023-07-15 MED ORDER — ALUM & MAG HYDROXIDE-SIMETH 200-200-20 MG/5ML PO SUSP: 30.0000 mL | ORAL | Status: DC | PRN

## 2023-07-15 MED ORDER — ACETAMINOPHEN 325 MG PO TABS: 650.0000 mg | ORAL_TABLET | ORAL | Status: DC | PRN

## 2023-07-15 MED ORDER — PROCHLORPERAZINE 25 MG RE SUPP: 12.5000 mg | Freq: Four times a day (QID) | RECTAL | Status: DC | PRN

## 2023-07-15 MED ORDER — DIPHENHYDRAMINE HCL 25 MG PO CAPS: 25.0000 mg | ORAL_CAPSULE | Freq: Four times a day (QID) | ORAL | Status: DC | PRN

## 2023-07-15 MED ORDER — PROCHLORPERAZINE EDISYLATE 10 MG/2ML IJ SOLN: 5.0000 mg | Freq: Four times a day (QID) | INTRAMUSCULAR | Status: DC | PRN

## 2023-07-15 MED ORDER — BISACODYL 10 MG RE SUPP: 10.0000 mg | Freq: Every day | RECTAL | Status: DC | PRN

## 2023-07-15 MED ORDER — FLEET ENEMA RE ENEM: 1.0000 | ENEMA | Freq: Once | RECTAL | Status: DC | PRN

## 2023-07-15 MED ORDER — ATORVASTATIN CALCIUM 40 MG PO TABS
40.0000 mg | ORAL_TABLET | Freq: Every day | ORAL | Status: DC
  Administered 2023-07-15 – 2023-07-19 (×4): 40 mg via ORAL
  Filled 2023-07-15 (×5): qty 1

## 2023-07-15 MED ORDER — APIXABAN 5 MG PO TABS
5.0000 mg | ORAL_TABLET | Freq: Two times a day (BID) | ORAL | Status: DC
  Administered 2023-07-15 – 2023-07-19 (×6): 5 mg via ORAL
  Filled 2023-07-15 (×8): qty 1

## 2023-07-15 MED ORDER — PROCHLORPERAZINE MALEATE 5 MG PO TABS: 5.0000 mg | ORAL_TABLET | Freq: Four times a day (QID) | ORAL | Status: DC | PRN

## 2023-07-15 MED ORDER — ACETAMINOPHEN 325 MG PO TABS: 325.0000 mg | ORAL_TABLET | ORAL | Status: DC | PRN

## 2023-07-15 MED ORDER — TRAZODONE HCL 50 MG PO TABS: 25.0000 mg | ORAL_TABLET | Freq: Every evening | ORAL | Status: DC | PRN

## 2023-07-15 MED ORDER — ACETAMINOPHEN 160 MG/5ML PO SOLN: 650.0000 mg | ORAL | Status: DC | PRN

## 2023-07-15 MED ORDER — GUAIFENESIN-DM 100-10 MG/5ML PO SYRP: 5.0000 mL | ORAL_SOLUTION | Freq: Four times a day (QID) | ORAL | Status: DC | PRN

## 2023-07-15 MED ORDER — SENNOSIDES-DOCUSATE SODIUM 8.6-50 MG PO TABS
2.0000 | ORAL_TABLET | Freq: Every day | ORAL | Status: DC
  Administered 2023-07-15 – 2023-07-18 (×3): 2 via ORAL
  Filled 2023-07-15 (×5): qty 2

## 2023-07-15 MED ORDER — SENNOSIDES-DOCUSATE SODIUM 8.6-50 MG PO TABS: 1.0000 | ORAL_TABLET | Freq: Every evening | ORAL | Status: DC | PRN

## 2023-07-15 NOTE — Care Management Important Message (Signed)
 Important Message  Patient Details  Name: Henry Schneider MRN: [id number] Date of Birth: 1955/07/07   Important Message Given:  Yes - Medicare IM     Wynonia Hedges 07/15/2023, 3:29 PM

## 2023-07-15 NOTE — Progress Notes (Signed)
 Heart Failure Navigator Progress Note  Assessed for Heart & Vascular TOC clinic readiness.  Patient does not meet criteria due to established with [facility], discharging to CIR today.   Navigator available for reassessment of patient.   Jerilyn Monte, PharmD, BCPS Heart Failure Stewardship Pharmacist Phone [phone]

## 2023-07-15 NOTE — Telephone Encounter (Signed)
 Patient Product/process development scientist completed.    The patient is insured through [employer]. Patient has Medicare and is not eligible for a copay card, but may be able to apply for patient assistance or Medicare RX Payment Plan (Patient Must reach out to their plan, if eligible for payment plan), if available.    Ran test claim for Eliquis  5 mg and the current 30 day co-pay is $423.14 due to a $590.00 deductible.   This test claim was processed through [facility]- copay amounts may vary at other pharmacies due to pharmacy/plan contracts, or as the patient moves through the different stages of their insurance plan.     Morgan Arab, CPHT Pharmacy Technician III Certified Patient Advocate [facility] Patient Advocate Team Direct Number: [phone]  Fax: [phone]

## 2023-07-15 NOTE — Progress Notes (Signed)
 Occupational Therapy Treatment Patient Details Name: Henry Schneider MRN: [id number] DOB: 1955/02/02 Today's Date: 07/15/2023   History of present illness Pt is a 68 y.o. male presenting 6/13 via POV to [facility] with aphasia. 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 PMH significant for PE on Eliquis , CHF/NICM, s/p Environmental manager CRT-D placement, HTN, HLD, chronic left bundle branch block, PVD, Buerger's disease, emphysema, tobacco abuse   OT comments  Pt agreeable to OT session.  Pt sequencing and managing basic ADLs in room with contact guard (dynamically) and close supervision (statically), retrieving items from closet after cueing and completing basin bath/dressing.  Demonstrates difficulty with multiple step commands due to expressive and receptive aphasia.  Able to read his room number, but unable to understand finding room 09 until therapist provided written directions; then he was able to locate next location with 1 cue; able to navigate back to his room without difficulty. Pt does write 19 and 09 in his room after session and reports understanding the difference now. He remains highly motivated and eager to get to intensive rehab.  Will follow acutely.       If plan is discharge home, recommend the following:  A little help with walking and/or transfers;A little help with bathing/dressing/bathroom;Assistance with cooking/housework;Assist for transportation;Help with stairs or ramp for entrance;Direct supervision/assist for financial management;Direct supervision/assist for medications management   Equipment Recommendations  Other (comment) (defer)    Recommendations for Other Services Rehab consult;Speech consult    Precautions / Restrictions Precautions Precautions: Fall;Other (comment) (aphasia) Restrictions Weight Bearing Restrictions Per Provider Order: No       Mobility Bed Mobility               General bed mobility comments: OOB upon entry     Transfers Overall transfer level: Needs assistance Equipment used: None Transfers: Sit to/from Stand Sit to Stand: Supervision           General transfer comment: for safety     Balance Overall balance assessment: Mild deficits observed, not formally tested                                         ADL either performed or assessed with clinical judgement   ADL Overall ADL's : Needs assistance/impaired     Grooming: Supervision/safety;Standing   Upper Body Bathing: Supervision/ safety;Standing   Lower Body Bathing: Contact guard assist;Sit to/from stand   Upper Body Dressing : Contact guard assist;Standing       Toilet Transfer: Supervision/safety;Ambulation           Functional mobility during ADLs: Supervision/safety General ADL Comments: close supervision for safety during mobility    Extremity/Trunk Assessment              Vision       Perception     Praxis     Communication Communication Communication: Impaired Factors Affecting Communication: Difficulty expressing self;Other (comment) (receptive language)   Cognition Arousal: Alert Behavior During Therapy: WFL for tasks assessed/performed, Impulsive Cognition: Cognition impaired, Difficult to assess Difficult to assess due to: Impaired communication   Awareness: Intellectual awareness intact, Online awareness impaired   Attention impairment (select first level of impairment): Selective attention Executive functioning impairment (select all impairments): Sequencing, Organization, Problem solving OT - Cognition Comments: limited by receptive and expressive deficits, he is able to follow commands for ADLs but limited during 2 step,  dual cognitive task, and trail making task. Pt able to read room number and understand task, but dificulty with understanding verbal command.  Upon return to room, pt writing 09 and 19 on his paper in order to figure out the difference and able  to explain to therapist.  Pt highly motivated to improve his commuincation.                 Following commands: Impaired Following commands impaired: Follows one step commands with increased time, Follows multi-step commands inconsistently      Cueing   Cueing Techniques: Verbal cues, Tactile cues, Visual cues, Gestural cues, Other (comments) (written language)  Exercises      Shoulder Instructions       General Comments      Pertinent Vitals/ Pain       Pain Assessment Pain Assessment: Faces Faces Pain Scale: No hurt Pain Intervention(s): Monitored during session  Home Living                                          Prior Functioning/Environment              Frequency  Min 2X/week        Progress Toward Goals  OT Goals(current goals can now be found in the care plan section)  Progress towards OT goals: Progressing toward goals  Acute Rehab OT Goals Patient Stated Goal: rehab OT Goal Formulation: With patient Time For Goal Achievement: 07/26/23 Potential to Achieve Goals: Good  Plan      Co-evaluation                 AM-PAC OT 6 Clicks Daily Activity     Outcome Measure   Help from another person eating meals?: A Little Help from another person taking care of personal grooming?: A Little Help from another person toileting, which includes using toliet, bedpan, or urinal?: A Little Help from another person bathing (including washing, rinsing, drying)?: A Little Help from another person to put on and taking off regular upper body clothing?: A Little Help from another person to put on and taking off regular lower body clothing?: A Little 6 Click Score: 18    End of Session    OT Visit Diagnosis: Unsteadiness on feet (R26.81);Low vision, both eyes (H54.2);Other symptoms and signs involving cognitive function;Cognitive communication deficit (R41.841)   Activity Tolerance Patient tolerated treatment well   Patient Left  with call bell/phone within reach;Other (comment) (EOB)   Nurse Communication Mobility status        Time: 3474-2595 OT Time Calculation (min): 19 min  Charges: OT General Charges $OT Visit: 1 Visit OT Treatments $Self Care/Home Management : 8-22 mins  Bary Boss, OT Acute Rehabilitation Services Office [phone] Secure Chat Preferred    Henry Schneider 07/15/2023, 11:51 AM

## 2023-07-15 NOTE — Progress Notes (Signed)
 PMR Admission Coordinator Pre-Admission Assessment   Patient: Henry Schneider is an 68 y.o., male MRN: [id number] DOB: [birth year] Height: 6' 3 (190.5 cm) Weight: 127.7 kg   Insurance Information HMO:     PPO:      PCP:      IPA:      80/20:      OTHER:  PRIMARY: Medicare A and B      Policy#: [id number]       Subscriber: PtCM Name:       Phone#:      Fax#:  Pre-Cert#: verified Health and safety inspector:  Benefits:  Phone #:      Name:  Eff. Date: a and B 01/28/2022    Deduct: $1632      Out of Pocket Max: n/a      Life Max: n/a CIR: 100%      SNF: 20 full days Outpatient:      Co-Pay:  Home Health: 100%      Co-Pay:  DME:      Co-Pay:  Providers:  SECONDARY:    Financial Counselor:       Phone#:    The "Data Collection Information Summary" for patients in Inpatient Rehabilitation Facilities with attached "Privacy Act Statement-Health Care Records" was provided and verbally reviewed with: Patient   Emergency Contact Information Contact Information       Name Relation Home Work Mobile    [place] Brother [phone]             Other Contacts   None on File        Current Medical History  Patient Admitting Diagnosis: CVA History of Present Illness: Henry Schneider is a 68 y.o. male with hx of PE on Eliquis , possibly history of A-fib as well, history of systolic CHF/NICM with an EF of 15% in April 2017 with improvement in 2021 to 30% to 35%, status post [facility] Scientific CRT-D placement, HTN, HLD, chronic left bundle branch block, peripheral vascular disease, Buerger's disease, emphysema, tobacco abuse,  On 07/12/23, Pt. Presented to [facility] for AMS. Code stroke was activated. Teleneurology MD evaluated the patient. CT head showed evolving cytotoxic edema involving left insula and overlying left cerebral hemisphere consistent with evolving acute left MCA distribution infarct, aspects score 7, abnormal hyperdense proximal left MCA branch concerning for thrombus.  CT angiography head and neck with acute proximal M3 stump occlusion, inferior division of the left MCA, otherwise negative. He was transferred to [facility] 07/12/23 for cerebral Angiogram and thrombectomy. He was seen by PT/OT/SLP and they recommended CIR to assist return to PLOF.    Complete NIHSS TOTAL: 2   Patient's medical record from [facility]  has been reviewed by the rehabilitation admission coordinator and physician.   Past Medical History  No past medical history on file.       Has the patient had major surgery during 100 days prior to admission? yes   Family History   family history includes Coronary artery disease in his father and mother.   Current Medications  Current Medications    Current Facility-Administered Medications:    acetaminophen  (TYLENOL ) tablet 650 mg, 650 mg, Oral, Q4H PRN, 650 mg at 07/13/23 0738 **OR** acetaminophen  (TYLENOL ) 160 MG/5ML solution 650 mg, 650 mg, Per Tube, Q4H PRN **OR** acetaminophen  (TYLENOL ) suppository 650 mg, 650 mg, Rectal, Q4H PRN, Arora, Ashish, MD   apixaban  (ELIQUIS ) tablet 5 mg, 5 mg, Oral, BID, Carney, Camilo Cella  C, RPH, 5 mg at 07/14/23 1051   Chlorhexidine Gluconate Cloth 2 % PADS 6 each, 6 each, Topical, Daily, Arora, Ashish, MD, 6 each at 07/12/23 1145   clevidipine (CLEVIPREX) infusion 0.5 mg/mL, 0-21 mg/hr, Intravenous, Continuous, Reagan Camera, MD   iohexol  (OMNIPAQUE ) 300 MG/ML solution 150 mL, 150 mL, Intra-arterial, Once PRN, Reagan Camera, MD   Oral care mouth rinse, 15 mL, Mouth Rinse, PRN, Arora, Ashish, MD   senna-docusate (Senokot-S) tablet 1 tablet, 1 tablet, Oral, QHS PRN, Arora, Ashish, MD     Patients Current Diet:  Diet Order                  Diet regular Room service appropriate? Yes; Fluid consistency: Thin  Diet effective now                         Precautions / Restrictions Precautions Precautions: Fall, Other (comment) (aphasia) Restrictions Weight Bearing  Restrictions Per Provider Order: No    Has the patient had 2 or more falls or a fall with injury in the past year? No   Prior Activity Level Community (5-7x/wk): Pt. active int the community PTA   Prior Functional Level Self Care: Did the patient need help bathing, dressing, using the toilet or eating? Independent   Indoor Mobility: Did the patient need assistance with walking from room to room (with or without device)? Independent   Stairs: Did the patient need assistance with internal or external stairs (with or without device)? Independent   Functional Cognition: Did the patient need help planning regular tasks such as shopping or remembering to take medications? Independent   Patient Information Are you of Hispanic, Latino/a,or Spanish origin?: A. No, not of Hispanic, Latino/a, or Spanish origin What is your race?: A. White Do you need or want an interpreter to communicate with a doctor or health care staff?: 0. No   Patient's Response To:  Health Literacy and Transportation Is the patient able to respond to health literacy and transportation needs?: Yes Health Literacy - How often do you need to have someone help you when you read instructions, pamphlets, or other written material from your doctor or pharmacy?: Never In the past 12 months, has lack of transportation kept you from medical appointments or from getting medications?: No In the past 12 months, has lack of transportation kept you from meetings, work, or from getting things needed for daily living?: No   Home Assistive Devices / Equipment   Prior Device Use: Indicate devices/aids used by the patient prior to current illness, exacerbation or injury? None of the above   Current Functional Level Cognition   Arousal/Alertness: Awake/alert Overall Cognitive Status: Difficult to assess Orientation Level: Oriented to person    Extremity Assessment (includes Sensation/Coordination)   Upper Extremity Assessment: Defer to  OT evaluation  Lower Extremity Assessment: RLE deficits/detail RLE Deficits / Details: pt was slower or not pointing to R side to indicate he could feel light touch as he had done quickly and well for his L with his eyes closed, indicating potential sensation deficits, but difficult to fully assess due to language deficits; strength 5/5 grossly bil with MMT RLE Sensation: decreased light touch (questionable)     ADLs   Overall ADL's : Needs assistance/impaired Eating/Feeding: NPO Grooming: Minimal assistance, Standing Grooming Details (indicate cue type and reason): cues for sequence Lower Body Dressing: Set up, Sitting/lateral leans, Contact guard assist Lower Body Dressing Details (indicate cue type and reason):  for socks Functional mobility during ADLs: Contact guard assist     Mobility   Overal bed mobility: Needs Assistance Bed Mobility: Supine to Sit Supine to sit: Supervision, HOB elevated General bed mobility comments: Supervision for safety transitioning supine to sit L EOB with HOB elevated, cuing pt through gestures and stating up to perform transition.     Transfers   Overall transfer level: Needs assistance Equipment used: None Transfers: Sit to/from Stand Sit to Stand: Contact guard assist General transfer comment: CGA for safety standing from EOB     Ambulation / Gait / Stairs / Wheelchair Mobility   Ambulation/Gait Ambulation/Gait assistance: Contact guard assist, Min assist Gait Distance (Feet): 140 Feet Assistive device: None Gait Pattern/deviations: Step-through pattern, Decreased stride length, Drifts right/left General Gait Details: Pt tends to veer/sway to his R, intermittently needing minA for balance or to avoid obstacles/objects for his safety. He did bump into obstacles a couple times on the R. Sway noted with simulating stepping over obstacles and with head turns/nods. Gait velocity: reduced Gait velocity interpretation: 1.31 - 2.62 ft/sec, indicative  of limited community ambulator     Posture / Balance Balance Overall balance assessment: Mild deficits observed, not formally tested Standing balance support: No upper extremity supported Standing balance-Leahy Scale: Fair     Special needs/care consideration Special service needs none    Previous Home Environment (from acute therapy documentation) Living Arrangements: Alone  Lives With: Alone Available Help at Discharge: Family Type of Home: House Home Layout: One level Home Access: Stairs to enter Entrance Stairs-Rails: Right Entrance Stairs-Number of Steps: 3 Bathroom Shower/Tub: Engineer, manufacturing systems: Standard Bathroom Accessibility: Yes How Accessible: Accessible via walker Home Care Services: No Additional Comments: family lives in [place]  or [place] and pt here alone. Niece present in room was unable to answer questions about home set-up (per OT eval; no family present during PT Eval)   Discharge Living Setting Plans for Discharge Living Setting: House Type of Home at Discharge: House Discharge Home Layout: Able to live on main level with bedroom/bathroom Discharge Home Access: Stairs to enter Entrance Stairs-Rails: Right Entrance Stairs-Number of Steps: 3 Discharge Bathroom Shower/Tub: Tub/shower unit Discharge Bathroom Toilet: Standard Discharge Bathroom Accessibility: Yes How Accessible: Accessible via walker   Social/Family/Support Systems Patient Roles: Spouse Contact Information: [phone] Anticipated Caregiver: Wilhelmina Harari Ability/Limitations of Caregiver: 24/7Supevision to min A Caregiver Availability: 24/7 Discharge Plan Discussed with Primary Caregiver: Yes Is Caregiver In Agreement with Plan?: Yes Does Caregiver/Family have Issues with Lodging/Transportation while Pt is in Rehab?: No   Goals Patient/Family Goal for Rehab: PT/OT/SLP Supervision Expected length of stay: 5-7 days Pt/Family Agrees to Admission and willing to participate:  Yes Program Orientation Provided & Reviewed with Pt/Caregiver Including Roles  & Responsibilities: Yes   Decrease burden of Care through IP rehab admission: Not anticipated   Possible need for SNF placement upon discharge: not anticipated   Patient Condition: I have reviewed medical records from [facility], spoken with CM, and patient and family member. I met with patient at the bedside for inpatient rehabilitation assessment.  Patient will benefit from ongoing PT, OT, and SLP, can actively participate in 3 hours of therapy a day 5 days of the week, and can make measurable gains during the admission.  Patient will also benefit from the coordinated team approach during an Inpatient Acute Rehabilitation admission.  The patient will receive intensive therapy as well as Rehabilitation physician, nursing, social worker, and care management interventions.  Due to  safety, skin/wound care, disease management, medication administration, pain management, and patient education the patient requires 24 hour a day rehabilitation nursing.  The patient is currently min A- CGA with mobility and basic ADLs.  Discharge setting and therapy post discharge at home with home health is anticipated.  Patient has agreed to participate in the Acute Inpatient Rehabilitation Program and will admit today.   Preadmission Screen Completed By:  Dorena Gander, 07/14/2023 2:32 PM ______________________________________________________________________   Discussed status with Dr. Rachel Budds on 07/15/23 at 900 and received approval for admission today.   Admission Coordinator:  Dorena Gander, CCC-SLP, time 1030/Date 07/15/23    Assessment/Plan: Diagnosis: left mca infarct Does the need for close, 24 hr/day Medical supervision in concert with the patient's rehab needs make it unreasonable for this patient to be served in a less intensive setting? Yes Co-Morbidities requiring supervision/potential complications: CHF, HTN,  afib Due to bladder management, bowel management, safety, skin/wound care, disease management, medication administration, pain management, and patient education, does the patient require 24 hr/day rehab nursing? Yes Does the patient require coordinated care of a physician, rehab nurse, PT, OT, and SLP to address physical and functional deficits in the context of the above medical diagnosis(es)? Yes Addressing deficits in the following areas: balance, endurance, locomotion, strength, transferring, bowel/bladder control, bathing, dressing, feeding, grooming, toileting, cognition, and psychosocial support Can the patient actively participate in an intensive therapy program of at least 3 hrs of therapy 5 days a week? Yes The potential for patient to make measurable gains while on inpatient rehab is excellent Anticipated functional outcomes upon discharge from inpatient rehab: supervision PT, supervision OT, supervision SLP Estimated rehab length of stay to reach the above functional goals is: 5-7 days Anticipated discharge destination: Home 10. Overall Rehab/Functional Prognosis: excellent     MD Signature: Rawland Caddy, MD, [facility] Physical Medicine & Rehabilitation Medical Director Rehabilitation Services 07/15/2023

## 2023-07-15 NOTE — H&P (Signed)
 Physical Medicine and Rehabilitation Admission H&P    CC: Functional debility   HPI: Henry Schneider is a 68 year old male with PMHx of PE on Eliquis , possible history of A-fib, history of systolic CHF/an ICM with an EF of 50% (04/2015) with improvement in 2021 to 30% to 35%, status post [facility] CRT-D placement, HTN, HLD, chronic left bundle branch block, PVD, Buerger's disease, emphysema, and tobacco abuse presented to [facility] on 07/11/2023 for evaluation of altered mental status.  While in the ER lobby patient had difficulty finding words and unable to provide history. Code stroke was activated in the setting of aphasia.  LKW unknown per Dr. Sherian Dimitri.  CTA head/neck showed acute proximal M3 stump occlusion, inferior division of the left MCA, otherwise negative. On 07/12/23 Dr. Laverta Potters performed  cerebral angio of left M2/M3 thrombectomy with findings for relatively large transverse M3 branch perfusing the posterior left parietal lobe.  Suction thrombectomy was performed x 3 with partial recanalization. No evidence of hemorrhage.   MRI of brain acute left MCA territory infarcts with trace right word midline shift. Eliquis  was resumed. Echo EF 25%-30%. Stroke felt to be cardioembolic versus LVO but hard to determine per Dr. Janett Medin.  Per chart review the patient is originally from [place]  but has recently relocated to the area where he lives in one level home with 3 steps to enter. PTA pt independent with ADL's currently requiring min A-CGA with mobility and basic ADL's. Therapy evaluations completed due to patient decreased functional mobility was admitted for a comprehensive rehab program.   Review of Systems  Constitutional: Negative.   HENT: Negative.    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    Cardiovascular: Negative.  Negative for chest pain, palpitations and leg swelling.  Gastrointestinal: Negative.    Genitourinary: Negative.   Musculoskeletal: Negative.   Skin: Negative.   Neurological:  Positive for speech change. Negative for dizziness, tingling, tremors, focal weakness, weakness and headaches.  Endo/Heme/Allergies: Negative.   Psychiatric/Behavioral: Negative.     No past medical history on file. Past Surgical History:  Procedure Laterality Date   IR CT HEAD LTD  07/12/2023   IR PERCUTANEOUS ART THROMBECTOMY/INFUSION INTRACRANIAL INC DIAG ANGIO  07/12/2023   IR US  GUIDE VASC ACCESS RIGHT  07/12/2023   RADIOLOGY WITH ANESTHESIA N/A 07/12/2023   Procedure: RADIOLOGY WITH ANESTHESIA;  Surgeon: Radiologist, Medication, MD;  Location: [facility] OR;  Service: Radiology;  Laterality: N/A;   Family History  Problem Relation Age of Onset   Coronary artery disease Mother    Coronary artery disease Father    Social History:  reports that he has been smoking cigarettes. He does not have any smokeless tobacco history on file. He reports current alcohol use of about 1.0 standard drink of alcohol per week. No history on file for drug use. Allergies: No Known Allergies Medications Prior to Admission  Medication Sig Dispense Refill   apixaban  (ELIQUIS ) 5 MG TABS tablet Take 1 tablet (5 mg total) by mouth 2 (two) times daily. 60 tablet 2   atorvastatin (LIPITOR) 40 MG tablet Take 40 mg by mouth daily.     dapagliflozin propanediol (FARXIGA) 10 MG TABS tablet Take 10 mg by mouth daily.     furosemide (LASIX) 20 MG tablet Take 20 mg by mouth daily.     metFORMIN (GLUCOPHAGE) 500 MG tablet Take 500 mg by mouth daily.  metoprolol succinate (TOPROL-XL) 50 MG 24 hr tablet Take 50-100 mg by mouth See admin instructions. Take 2 tablets (100mg ) by mouth in the morning and 1 tablet (50mg ) in the afternoon.     spironolactone (ALDACTONE) 25 MG tablet Take 12.5 mg by mouth daily.     valsartan (DIOVAN) 40 MG tablet Take 40 mg by mouth daily.        Home: Home Living Family/patient expects to be discharged  to:: Private residence Living Arrangements: Alone Available Help at Discharge: Family Type of Home: House Home Access: Stairs to enter Secretary/administrator of Steps: 3 Entrance Stairs-Rails: Right Home Layout: One level Bathroom Shower/Tub: Engineer, manufacturing systems: Standard Bathroom Accessibility: Yes Additional Comments: family lives in [place]  or [place] and pt here alone. Niece present in room was unable to answer questions about home set-up (per OT eval; no family present during PT Eval)  Lives With: Alone   Functional History: Prior Function Prior Level of Function : Independent/Modified Independent, Driving ADLs Comments: per niece pt does not work anymore  Functional Status:  Mobility: Bed Mobility Overal bed mobility: Needs Assistance Bed Mobility: Supine to Sit Supine to sit: Supervision, HOB elevated General bed mobility comments: OOB upon entry Transfers Overall transfer level: Needs assistance Equipment used: None Transfers: Sit to/from Stand Sit to Stand: Supervision General transfer comment: for safety Ambulation/Gait Ambulation/Gait assistance: Contact guard assist, Min assist Gait Distance (Feet): 140 Feet Assistive device: None Gait Pattern/deviations: Step-through pattern, Decreased stride length, Drifts right/left General Gait Details: Pt tends to veer/sway to his R, intermittently needing minA for balance or to avoid obstacles/objects for his safety. He did bump into obstacles a couple times on the R. Sway noted with simulating stepping over obstacles and with head turns/nods. Gait velocity: reduced Gait velocity interpretation: 1.31 - 2.62 ft/sec, indicative of limited community ambulator    ADL: ADL Overall ADL's : Needs assistance/impaired Eating/Feeding: NPO Grooming: Supervision/safety, Standing Grooming Details (indicate cue type and reason): cues for sequence Upper Body Bathing: Supervision/ safety, Standing Lower Body Bathing: Contact  guard assist, Sit to/from stand Upper Body Dressing : Contact guard assist, Standing Lower Body Dressing: Set up, Sitting/lateral leans, Contact guard assist Lower Body Dressing Details (indicate cue type and reason): for socks Toilet Transfer: Supervision/safety, Ambulation Functional mobility during ADLs: Supervision/safety General ADL Comments: close supervision for safety during mobility  Cognition: Cognition Overall Cognitive Status: Difficult to assess Arousal/Alertness: Awake/alert Orientation Level: Oriented to person, Oriented to place, Oriented to time, Disoriented to situation Cognition Arousal: Alert Behavior During Therapy: WFL for tasks assessed/performed, Impulsive Overall Cognitive Status: Difficult to assess  Physical Exam: Blood pressure 104/75, pulse 84, temperature (!) 97.5 F (36.4 C), temperature source Oral, resp. rate 14, height 6' 3 (1.905 m), weight 127.7 kg, SpO2 96%.  Physical Exam Vitals reviewed.  Constitutional:      General: He is not in acute distress.    Appearance: Normal appearance. He is obese.  HENT:     Right Ear: External ear normal.     Left Ear: External ear normal.     Nose: Nose normal.     Mouth/Throat:     Mouth: Mucous membranes are moist.     Pharynx: Oropharynx is clear.   Eyes:     Extraocular Movements: Extraocular movements intact.     Conjunctiva/sclera: Conjunctivae normal.     Pupils: Pupils are equal, round, and reactive to light.    Cardiovascular:     Rate and Rhythm: Normal rate and regular  rhythm.     Pulses: Normal pulses.     Heart sounds: Normal heart sounds. No murmur heard.    No gallop.  Abdominal:     General: There is no distension.     Palpations: Abdomen is soft.     Tenderness: There is no abdominal tenderness.   Musculoskeletal:        General: No swelling or tenderness. Normal range of motion.     Cervical back: Normal range of motion and neck supple.   Skin:    General: Skin is warm and  dry.     Capillary Refill: Capillary refill takes less than 2 seconds.   Neurological:     Mental Status: He is alert.     Sensory: Sensation is intact.     Motor: Motor function is intact.     Coordination: Coordination is intact.     Gait: Gait is intact.     Comments: Pt is alert, oriented x3 after some cues due to his language. Speech is clear. Pt with word finding deficits and word substitution. He was able to correctly repeat 50% of time. Pt generally able to follow simple commands but occasionally needed verbal and tactile cueing. MMT: pt appears to be 5/5 in BUE prox to distal. He also appeared to be 5/5 in both LE prox to distal. Sensory exam normal for light touch and pain in all 4 limbs. No limb ataxia or cerebellar signs. No abnormal tone appreciated.  Good sitting balance.   Psychiatric:        Mood and Affect: Mood normal.        Behavior: Behavior normal.     No results found for this or any previous visit (from the past 48 hours). MR BRAIN WO CONTRAST Result Date: 07/14/2023 CLINICAL DATA:  Stroke, follow up EXAM: MRI HEAD WITHOUT CONTRAST TECHNIQUE: Multiplanar, multiecho pulse sequences of the brain and surrounding structures were obtained without intravenous contrast. COMPARISON:  CT head June 14 25. FINDINGS: Brain: Acute left MCA territory infarcts including acute infarcts in left basal ganglia, left insula, overlying left frontal, parietal and temporal lobes. Associated edema with trace (1-2 mm) rightward midline shift. No evidence of acute hemorrhage, mass lesion, or hydrocephalus. Patchy T2/FLAIR hyperintensities in the white matter, compatible with chronic microvascular disease. Vascular: Major arterial flow voids are maintained at the skull base. Skull and upper cervical spine: Normal marrow signal. Sinuses/Orbits: Mild paranasal sinus mucosal thickening. No acute orbital findings. IMPRESSION: Acute left MCA territory infarcts with trace (1-2 mm) rightward midline shift.  Electronically Signed   By: Stevenson Elbe M.D.   On: 07/14/2023 19:21      Blood pressure 104/75, pulse 84, temperature (!) 97.5 F (36.4 C), temperature source Oral, resp. rate 14, height 6' 3 (1.905 m), weight 127.7 kg, SpO2 96%.  Medical Problem List and Plan: 1. Functional deficits secondary to a Left MCA infarct  -patient may shower  -ELOS/Goals: 5-7 days, sup to mod I with mobility and self-care and supervision with language  2.  Antithrombotics: -DVT/anticoagulation:  Pharmaceutical: Eliquis   -antiplatelet therapy: N/A  3. Pain Management: Tylenol  prn  4. Mood/Behavior/Sleep: LCSW to follow for evaluation and support when available.  -antipsychotic agents: N/A 5. Neuropsych/cognition: This patient is capable of making decisions on his own behalf. 6. Skin/Wound Care: Routine skin checks-monitor R groin incision 7. Fluids/Electrolytes/Nutrition: Monitor I/O  check CMET in a.m.  -Diet Regular changed to Carb modified     8. Hyperlipidemia: LDL 44 Atorvastatin resumed  today   9. Hypertension: stable- home Valsartan not resumed    10. T2DM: controlled A1c 6.0.?? resume metformin   - 6/13 glucose 112   -will check a few CBG's. If readings are reasonable then dc checks  -he may not need metformin if he's compliant with CM diet 11. Leukocytosis: WBC 13.9 6/13 monitor repeat CBC in a.m. 12. Tobacco: Discuss importance of smoking cessation and risk factors.  13. Constipation:  -no reported BM since admit   -he appears to be eating  -senna-s 2 tabs at bedtime     Nasario Badder, NP 07/15/2023

## 2023-07-15 NOTE — Discharge Summary (Addendum)
 Stroke Discharge Summary  Patient ID: Henry Schneider   MRN: [id number]      DOB: [birth year]  Date of Admission: 07/12/2023 Date of Discharge: 07/15/2023  Attending Physician:  Stroke, Md, MD, Stroke MD Consultant(s):   Interventional radiology, Dr. Laverta Potters Patient's PCP:  Patient, No Pcp Per  Discharge Diagnoses: Stroke:  left Insula, left MCA territory Etiology:  Cardioembolic in the setting of afib but cannot rule out large vessel disease   Active Problems:   Acute ischemic left MCA stroke (HCC) Expressive aphasia  Medications to be continued on Rehab Allergies as of 07/15/2023   No Known Allergies      Medication List     TAKE these medications    apixaban  5 MG Tabs tablet Commonly known as: ELIQUIS  Take 1 tablet (5 mg total) by mouth 2 (two) times daily.   atorvastatin 40 MG tablet Commonly known as: LIPITOR Take 40 mg by mouth daily.   dapagliflozin propanediol 10 MG Tabs tablet Commonly known as: FARXIGA Take 10 mg by mouth daily.   furosemide 20 MG tablet Commonly known as: LASIX Take 20 mg by mouth daily.   metFORMIN 500 MG tablet Commonly known as: GLUCOPHAGE Take 500 mg by mouth daily.   metoprolol succinate 50 MG 24 hr tablet Commonly known as: TOPROL-XL Take 50-100 mg by mouth See admin instructions. Take 2 tablets (100mg ) by mouth in the morning and 1 tablet (50mg ) in the afternoon.   spironolactone 25 MG tablet Commonly known as: ALDACTONE Take 12.5 mg by mouth daily.   valsartan 40 MG tablet Commonly known as: DIOVAN Take 40 mg by mouth daily.        LABORATORY STUDIES CBC    Component Value Date/Time   WBC 13.9 (H) 07/11/2023 2240   RBC 5.24 07/11/2023 2240   HGB 16.5 07/11/2023 2240   HCT 49.9 07/11/2023 2240   PLT 189 07/11/2023 2240   MCV 95.2 07/11/2023 2240   MCH 31.5 07/11/2023 2240   MCHC 33.1 07/11/2023 2240   RDW 13.2 07/11/2023 2240   LYMPHSABS 3.2 07/11/2023 2240   MONOABS 1.1 (H) 07/11/2023 2240   EOSABS 0.2  07/11/2023 2240   BASOSABS 0.1 07/11/2023 2240   CMP    Component Value Date/Time   NA 138 07/11/2023 2240   K 4.3 07/11/2023 2240   CL 110 07/11/2023 2240   CO2 21 (L) 07/11/2023 2240   GLUCOSE 106 (H) 07/11/2023 2240   BUN 27 (H) 07/11/2023 2240   CREATININE 1.13 07/11/2023 2240   CALCIUM 9.0 07/11/2023 2240   PROT 6.9 07/11/2023 2240   ALBUMIN 3.9 07/11/2023 2240   AST 20 07/11/2023 2240   ALT 24 07/11/2023 2240   ALKPHOS 60 07/11/2023 2240   BILITOT 1.3 (H) 07/11/2023 2240   GFRNONAA >60 07/11/2023 2240   GFRAA >60 12/20/2014 0235   COAGS Lab Results  Component Value Date   INR 1.1 07/11/2023   INR 1.10 12/19/2014   Lipid Panel    Component Value Date/Time   CHOL 95 07/12/2023 0705   TRIG 66 07/12/2023 0705   HDL 38 (L) 07/12/2023 0705   CHOLHDL 2.5 07/12/2023 0705   VLDL 13 07/12/2023 0705   LDLCALC 44 07/12/2023 0705   HgbA1C  Lab Results  Component Value Date   HGBA1C 6.0 (H) 07/12/2023   Urinalysis No results found for: COLORURINE, APPEARANCEUR, LABSPEC, PHURINE, GLUCOSEU, HGBUR, BILIRUBINUR, KETONESUR, PROTEINUR, UROBILINOGEN, NITRITE, LEUKOCYTESUR Urine Drug Screen No results found for: LABOPIA, COCAINSCRNUR, LABBENZ,  AMPHETMU, THCU, LABBARB  Alcohol Level    Component Value Date/Time   [facility] <15 07/11/2023 2240     SIGNIFICANT DIAGNOSTIC STUDIES MR BRAIN WO CONTRAST Result Date: 07/14/2023 CLINICAL DATA:  Stroke, follow up EXAM: MRI HEAD WITHOUT CONTRAST TECHNIQUE: Multiplanar, multiecho pulse sequences of the brain and surrounding structures were obtained without intravenous contrast. COMPARISON:  CT head June 14 25. FINDINGS: Brain: Acute left MCA territory infarcts including acute infarcts in left basal ganglia, left insula, overlying left frontal, parietal and temporal lobes. Associated edema with trace (1-2 mm) rightward midline shift. No evidence of acute hemorrhage, mass lesion, or hydrocephalus. Patchy  T2/FLAIR hyperintensities in the white matter, compatible with chronic microvascular disease. Vascular: Major arterial flow voids are maintained at the skull base. Skull and upper cervical spine: Normal marrow signal. Sinuses/Orbits: Mild paranasal sinus mucosal thickening. No acute orbital findings. IMPRESSION: Acute left MCA territory infarcts with trace (1-2 mm) rightward midline shift. Electronically Signed   By: Stevenson Elbe M.D.   On: 07/14/2023 19:21   ECHOCARDIOGRAM COMPLETE Result Date: 07/12/2023    ECHOCARDIOGRAM REPORT   Patient Name:   Henry Schneider Date of Exam: 07/12/2023 Medical Rec #:  [id number]      Height:       75.0 in Accession #:    [id number]     Weight:       281.5 lb Date of Birth:  Sep 02, 1955      BSA:          2.538 m Patient Age:    68 years       BP:           95/84 mmHg Patient Gender: M              HR:           63 bpm. Exam Location:  Inpatient Procedure: 2D Echo, Cardiac Doppler and Color Doppler (Both Spectral and Color            Flow Doppler were utilized during procedure). Indications:    Stroke I63.9  History:        Patient has prior history of Echocardiogram examinations, most                 recent 12/19/2014. CHF, Stroke; Risk Factors:Current Smoker.  Sonographer:    Kip Peon RDCS Referring Phys: [id number] ASHISH ARORA IMPRESSIONS  1. Left ventricular ejection fraction, by estimation, is 25 to 30%. The left ventricle has severely decreased function. The left ventricle demonstrates regional wall motion abnormalities (see scoring diagram/findings for description). There is mild left  ventricular hypertrophy. Left ventricular diastolic parameters are consistent with Grade I diastolic dysfunction (impaired relaxation).  2. Right ventricular systolic function is normal. The right ventricular size is mildly enlarged. There is normal pulmonary artery systolic pressure. The estimated right ventricular systolic pressure is 26.8 mmHg.  3. Left atrial size was mildly  dilated.  4. Right atrial size was mildly dilated.  5. The mitral valve is normal in structure. Trivial mitral valve regurgitation. No evidence of mitral stenosis.  6. The aortic valve is tricuspid. Aortic valve regurgitation is not visualized. No aortic stenosis is present.  7. Aortic dilatation noted. There is dilatation of the ascending aorta, measuring 40 mm.  8. The inferior vena cava is normal in size with greater than 50% respiratory variability, suggesting right atrial pressure of 3 mmHg. Conclusion(s)/Recommendation(s): Given presentation with CVA and severe systolic dysfunction with apical akinesis, recommend repeat limited echo with contrast  to evaluate for LV thrombus. FINDINGS  Left Ventricle: Left ventricular ejection fraction, by estimation, is 25 to 30%. The left ventricle has severely decreased function. The left ventricle demonstrates regional wall motion abnormalities. The left ventricular internal cavity size was normal  in size. There is mild left ventricular hypertrophy. Left ventricular diastolic parameters are consistent with Grade I diastolic dysfunction (impaired relaxation).  LV Wall Scoring: The entire lateral wall, entire inferior wall, apical anterior segment, and apex are akinetic. The anterior wall and entire septum are normal. Right Ventricle: The right ventricular size is mildly enlarged. No increase in right ventricular wall thickness. Right ventricular systolic function is normal. There is normal pulmonary artery systolic pressure. The tricuspid regurgitant velocity is 2.44  m/s, and with an assumed right atrial pressure of 3 mmHg, the estimated right ventricular systolic pressure is 26.8 mmHg. Left Atrium: Left atrial size was mildly dilated. Right Atrium: Right atrial size was mildly dilated. Pericardium: There is no evidence of pericardial effusion. Mitral Valve: The mitral valve is normal in structure. Trivial mitral valve regurgitation. No evidence of mitral valve stenosis.  Tricuspid Valve: The tricuspid valve is normal in structure. Tricuspid valve regurgitation is trivial. Aortic Valve: The aortic valve is tricuspid. Aortic valve regurgitation is not visualized. No aortic stenosis is present. Pulmonic Valve: The pulmonic valve was not well visualized. Pulmonic valve regurgitation is not visualized. Aorta: The aortic root is normal in size and structure and aortic dilatation noted. There is dilatation of the ascending aorta, measuring 40 mm. Venous: The inferior vena cava is normal in size with greater than 50% respiratory variability, suggesting right atrial pressure of 3 mmHg. IAS/Shunts: The interatrial septum was not well visualized.  LEFT VENTRICLE PLAX 2D LVIDd:         5.50 cm   Diastology LVIDs:         5.00 cm   LV e' medial:    4.03 cm/s LV PW:         1.30 cm   LV E/e' medial:  10.8 LV IVS:        1.20 cm   LV e' lateral:   5.12 cm/s LVOT diam:     2.10 cm   LV E/e' lateral: 8.5 LV SV:         42 LV SV Index:   17 LVOT Area:     3.46 cm  RIGHT VENTRICLE             IVC RV Basal diam:  4.60 cm     IVC diam: 2.00 cm RV S prime:     19.30 cm/s TAPSE (M-mode): 3.1 cm LEFT ATRIUM              Index        RIGHT ATRIUM           Index LA diam:        3.80 cm  1.50 cm/m   RA Area:     26.20 cm LA Vol (A2C):   69.5 ml  27.39 ml/m  RA Volume:   80.50 ml  31.72 ml/m LA Vol (A4C):   104.0 ml 40.98 ml/m LA Biplane Vol: 89.7 ml  35.35 ml/m  AORTIC VALVE LVOT Vmax:   65.70 cm/s LVOT Vmean:  41.700 cm/s LVOT VTI:    0.121 m  AORTA Ao Root diam: 3.90 cm Ao Asc diam:  4.00 cm MITRAL VALVE               TRICUSPID  VALVE MV Area (PHT): 2.56 cm    TR Peak grad:   23.8 mmHg MV Decel Time: 296 msec    TR Vmax:        244.00 cm/s MV E velocity: 43.50 cm/s MV A velocity: 78.10 cm/s  SHUNTS MV E/A ratio:  0.56        Systemic VTI:  0.12 m                            Systemic Diam: 2.10 cm Carson Clara MD Electronically signed by Carson Clara MD Signature Date/Time:  07/12/2023/1:15:57 PM    Final    IR PERCUTANEOUS ART THROMBECTOMY/INFUSION INTRACRANIAL INC DIAG ANGIO Result Date: 07/12/2023 PROCEDURE PERFORMED: 1. Stroke thrombectomy 2. Ultrasound vascular access 3. Cone beam CT for treatment planning COMPARISON:  CT perfusion performed July 11, 2023 CLINICAL DATA:  68 year old male with history of acute ischemic stroke with symptoms aphasia. NIH stroke scale was measured at 5, however, symptoms were disabling. Patient was anticoagulated and not a candidate for thrombo lytic therapy. Stroke imaging was notable for a large vessel occlusion with left M2/M3 occlusion. Patient was reviewed in a multidisciplinary setting and considered a suitable candidate for stroke thrombectomy. INDICATION: Acute ischemic stroke. ANESTHESIA/SEDATION: General anesthesia was utilized for the procedure. CONTRAST:  Approximately 100 cc Omnipaque  300 MEDICATIONS: Heparin  3000 units intravenously FLUOROSCOPY TIME:  Fluoroscopy Time: 19 minutes (2113 mGy). COMPLICATIONS: None immediate. BODY OF REPORT: Following a full explanation of the procedure along with the potential associated complications, an informed witnessed consent was obtained. The patient was then placed under general anesthesia by the Department of Anesthesiology at [facility]. The right groin was prepped and draped in the usual sterile fashion. Ultrasound was used to study the right superficial femoral artery which was patent. Using real-time ultrasound guidance, a 21 gauge introducer needle was used to access the right common femoral artery. Access was performed at 0054. A hard copy image ultrasound the same date uncertain PACS. Using this access, a 6 French sheath was placed in the descending thoracic aorta. Approximately 3000 units of heparin  was administered intravenously. Next, selective catheterization the left internal carotid artery was performed. A selective arteriogram was performed which demonstrated that the  internal carotid artery was patent. In occlusion of an inferior M3 division artery was observed with no significant perfusion to the posterior parietal lobe through this vessel. The remaining MCA branches were patent. Next, a penumbra 43 and red 62 catheter was used to selectively catheterize the main trunk of the left M2 division. A selective arteriogram confirmed the M3 branch occlusion. Next, a wire was advanced into the M3 branch occlusion in this was followed with advancement of the penumbra 43 catheter. Suction thrombectomy was performed with first pass at 01:23. The first treatment demonstrated improvement in perfusion to this territory with residual wall adherent thrombus at a bifurcation point as well as along the wall of the target vessel. A second pass was performed at 01:40 with modest improvement. A third pass was performed at 01:59 using a zoom 35 catheter. A small volume of thrombus material was removed and I elected to terminate the procedure after this pass. Partial recanalization was achieved with a TICI score 2b. After reviewing the imaging I elected to terminate the procedure at this point. Evaluation of the right femoral access site demonstrated at this site was suitable for closure device. A 6 French Angio-Seal device was deployed without complication. Cone beam  CT was then performed to evaluate for intracranial hemorrhage and treatment planning. This demonstrated no evidence significant acute intracranial hemorrhage. The patient was removed from anesthesia and transferred to recovery in stable condition. IMPRESSION: 1. Suction thrombectomy of left M3 branch occlusion which provide significant perfusion to the posterior left parietal lobe. 2. Post treatment TICI score 2b. PLAN: 1. To ICU for routine postoperative supportive care. 2. A follow-up CT is planned for early morning. If there is no evidence of hemorrhage, given suspected cardioembolic etiology and residual nonocclusive wall adherent  thrombus, consideration may be given toward initiation of a neuro dose heparin  infusion. Electronically Signed   By: Reagan Camera M.D.   On: 07/12/2023 09:23   IR CT Head Ltd Result Date: 07/12/2023 PROCEDURE PERFORMED: 1. Stroke thrombectomy 2. Ultrasound vascular access 3. Cone beam CT for treatment planning COMPARISON:  CT perfusion performed July 11, 2023 CLINICAL DATA:  68 year old male with history of acute ischemic stroke with symptoms aphasia. NIH stroke scale was measured at 5, however, symptoms were disabling. Patient was anticoagulated and not a candidate for thrombo lytic therapy. Stroke imaging was notable for a large vessel occlusion with left M2/M3 occlusion. Patient was reviewed in a multidisciplinary setting and considered a suitable candidate for stroke thrombectomy. INDICATION: Acute ischemic stroke. ANESTHESIA/SEDATION: General anesthesia was utilized for the procedure. CONTRAST:  Approximately 100 cc Omnipaque  300 MEDICATIONS: Heparin  3000 units intravenously FLUOROSCOPY TIME:  Fluoroscopy Time: 19 minutes (2113 mGy). COMPLICATIONS: None immediate. BODY OF REPORT: Following a full explanation of the procedure along with the potential associated complications, an informed witnessed consent was obtained. The patient was then placed under general anesthesia by the Department of Anesthesiology at [facility]. The right groin was prepped and draped in the usual sterile fashion. Ultrasound was used to study the right superficial femoral artery which was patent. Using real-time ultrasound guidance, a 21 gauge introducer needle was used to access the right common femoral artery. Access was performed at 0054. A hard copy image ultrasound the same date uncertain PACS. Using this access, a 6 French sheath was placed in the descending thoracic aorta. Approximately 3000 units of heparin  was administered intravenously. Next, selective catheterization the left internal carotid artery was performed.  A selective arteriogram was performed which demonstrated that the internal carotid artery was patent. In occlusion of an inferior M3 division artery was observed with no significant perfusion to the posterior parietal lobe through this vessel. The remaining MCA branches were patent. Next, a penumbra 43 and red 62 catheter was used to selectively catheterize the main trunk of the left M2 division. A selective arteriogram confirmed the M3 branch occlusion. Next, a wire was advanced into the M3 branch occlusion in this was followed with advancement of the penumbra 43 catheter. Suction thrombectomy was performed with first pass at 01:23. The first treatment demonstrated improvement in perfusion to this territory with residual wall adherent thrombus at a bifurcation point as well as along the wall of the target vessel. A second pass was performed at 01:40 with modest improvement. A third pass was performed at 01:59 using a zoom 35 catheter. A small volume of thrombus material was removed and I elected to terminate the procedure after this pass. Partial recanalization was achieved with a TICI score 2b. After reviewing the imaging I elected to terminate the procedure at this point. Evaluation of the right femoral access site demonstrated at this site was suitable for closure device. A 6 French Angio-Seal device was deployed without complication.  Cone beam CT was then performed to evaluate for intracranial hemorrhage and treatment planning. This demonstrated no evidence significant acute intracranial hemorrhage. The patient was removed from anesthesia and transferred to recovery in stable condition. IMPRESSION: 1. Suction thrombectomy of left M3 branch occlusion which provide significant perfusion to the posterior left parietal lobe. 2. Post treatment TICI score 2b. PLAN: 1. To ICU for routine postoperative supportive care. 2. A follow-up CT is planned for early morning. If there is no evidence of hemorrhage, given  suspected cardioembolic etiology and residual nonocclusive wall adherent thrombus, consideration may be given toward initiation of a neuro dose heparin  infusion. Electronically Signed   By: Reagan Camera M.D.   On: 07/12/2023 09:23   IR US  Guide Vasc Access Right Result Date: 07/12/2023 PROCEDURE PERFORMED: 1. Stroke thrombectomy 2. Ultrasound vascular access 3. Cone beam CT for treatment planning COMPARISON:  CT perfusion performed July 11, 2023 CLINICAL DATA:  68 year old male with history of acute ischemic stroke with symptoms aphasia. NIH stroke scale was measured at 5, however, symptoms were disabling. Patient was anticoagulated and not a candidate for thrombo lytic therapy. Stroke imaging was notable for a large vessel occlusion with left M2/M3 occlusion. Patient was reviewed in a multidisciplinary setting and considered a suitable candidate for stroke thrombectomy. INDICATION: Acute ischemic stroke. ANESTHESIA/SEDATION: General anesthesia was utilized for the procedure. CONTRAST:  Approximately 100 cc Omnipaque  300 MEDICATIONS: Heparin  3000 units intravenously FLUOROSCOPY TIME:  Fluoroscopy Time: 19 minutes (2113 mGy). COMPLICATIONS: None immediate. BODY OF REPORT: Following a full explanation of the procedure along with the potential associated complications, an informed witnessed consent was obtained. The patient was then placed under general anesthesia by the Department of Anesthesiology at [facility]. The right groin was prepped and draped in the usual sterile fashion. Ultrasound was used to study the right superficial femoral artery which was patent. Using real-time ultrasound guidance, a 21 gauge introducer needle was used to access the right common femoral artery. Access was performed at 0054. A hard copy image ultrasound the same date uncertain PACS. Using this access, a 6 French sheath was placed in the descending thoracic aorta. Approximately 3000 units of heparin  was administered  intravenously. Next, selective catheterization the left internal carotid artery was performed. A selective arteriogram was performed which demonstrated that the internal carotid artery was patent. In occlusion of an inferior M3 division artery was observed with no significant perfusion to the posterior parietal lobe through this vessel. The remaining MCA branches were patent. Next, a penumbra 43 and red 62 catheter was used to selectively catheterize the main trunk of the left M2 division. A selective arteriogram confirmed the M3 branch occlusion. Next, a wire was advanced into the M3 branch occlusion in this was followed with advancement of the penumbra 43 catheter. Suction thrombectomy was performed with first pass at 01:23. The first treatment demonstrated improvement in perfusion to this territory with residual wall adherent thrombus at a bifurcation point as well as along the wall of the target vessel. A second pass was performed at 01:40 with modest improvement. A third pass was performed at 01:59 using a zoom 35 catheter. A small volume of thrombus material was removed and I elected to terminate the procedure after this pass. Partial recanalization was achieved with a TICI score 2b. After reviewing the imaging I elected to terminate the procedure at this point. Evaluation of the right femoral access site demonstrated at this site was suitable for closure device. A 6 French Angio-Seal device  was deployed without complication. Cone beam CT was then performed to evaluate for intracranial hemorrhage and treatment planning. This demonstrated no evidence significant acute intracranial hemorrhage. The patient was removed from anesthesia and transferred to recovery in stable condition. IMPRESSION: 1. Suction thrombectomy of left M3 branch occlusion which provide significant perfusion to the posterior left parietal lobe. 2. Post treatment TICI score 2b. PLAN: 1. To ICU for routine postoperative supportive care. 2. A  follow-up CT is planned for early morning. If there is no evidence of hemorrhage, given suspected cardioembolic etiology and residual nonocclusive wall adherent thrombus, consideration may be given toward initiation of a neuro dose heparin  infusion. Electronically Signed   By: Reagan Camera M.D.   On: 07/12/2023 09:23   DG Chest 1 View Result Date: 07/12/2023 CLINICAL DATA:  Aspiration into airway. EXAM: CHEST  1 VIEW COMPARISON:  12/19/2014 FINDINGS: Moderate cardiomegaly again seen. AICD with leads overlying the right heart. Moderate elevation of left hemidiaphragm noted. Both lungs are clear. IMPRESSION: Moderate cardiomegaly. No active lung disease. Elevated left hemidiaphragm. Electronically Signed   By: Marlyce Sine M.D.   On: 07/12/2023 08:59   CT HEAD POST STROKE FOLLOWUP/TIMED/STAT READ Result Date: 07/12/2023 EXAM: CT HEAD WITHOUT CONTRAST 07/12/2023 07:53:59 AM TECHNIQUE: CT of the head was performed without the administration of intravenous contrast. Automated exposure control, iterative reconstruction, and/or weight based adjustment of the mA/kV was utilized to reduce the radiation dose to as low as reasonably achievable. COMPARISON: CT head without contrast and CT angio head and neck 07/11/2023. CLINICAL HISTORY: Neuro deficit, acute, stroke suspected. FINDINGS: BRAIN AND VENTRICLES: The nonhemorrhagic left temporal and parietal infarct demonstrates expected evolution. No acute hemorrhage is present. No significant expansion of the infarct is present. Mass effect is present with effacement of the sulci. 1 to 2 mm midline shift is present. Cavum sptum pellucidum is present. ORBITS: The visualized portion of the orbits demonstrate no acute abnormality. SINUSES: Mild mucosal thickening is present in the right greater than left maxillary sinus. The paranasal sinuses and mastoid air cells are otherwise clear. SOFT TISSUES AND SKULL: No acute abnormality of the visualized skull or soft tissues.  IMPRESSION: 1. Nonhemorrhagic left temporal and parietal infarct with expected evolution. 2. Developing mass effect, effacement of the sulci, and 1 to 2 mm midline shift is present. 3. No acute hemorrhage or significant expansion of the infarct. Electronically signed by: Audree Leas MD 07/12/2023 08:02 AM EDT RP Workstation: [id number]   CT HEAD CODE STROKE WO CONTRAST` Result Date: 07/11/2023 CLINICAL DATA:  Initial EXAM: CT ANGIOGRAPHY HEAD AND NECK CT PERFUSION BRAIN TECHNIQUE: Multidetector CT imaging of the head and neck was performed using the standard protocol during bolus administration of intravenous contrast. Multiplanar CT image reconstructions and MIPs were obtained to evaluate the vascular anatomy. Carotid stenosis measurements (when applicable) are obtained utilizing NASCET criteria, using the distal internal carotid diameter as the denominator. Multiphase CT imaging of the brain was performed following IV bolus contrast injection. Subsequent parametric perfusion maps were calculated using RAPID software. RADIATION DOSE REDUCTION: This exam was performed according to the departmental dose-optimization program which includes automated exposure control, adjustment of the mA and/or kV according to patient size and/or use of iterative reconstruction technique. CONTRAST:  OMNIPAQUE  IOHEXOL  350 MG/ML SOLN COMPARISON:  None available. FINDINGS: CT HEAD FINDINGS Brain: Evolving cytotoxic edema involving the left insula and posterior left cerebral hemisphere, consistent with an evolving left MCA distribution infarct. Mild patchy involvement of the anterior left frontal lobe  as well. Sparing of the basal ganglia. No significant mass effect or midline shift. No hydrocephalus. No acute intracranial hemorrhage. No extra-axial fluid collection. Vascular: Abnormal hyperdensity involving proximal left MCA branch, into and/or M3 segment, concerning for thrombus (series 4, image 13). Skull: Scalp soft  tissues within normal limits.  Calvarium intact. Sinuses/Orbits: Globes orbital soft tissues within normal limits. Chronic mucosal thickening present about the right sphenoid sinus. Paranasal sinuses are otherwise clear. No mastoid effusion. Other: None. ASPECTS ([place]ta Stroke Program Early CT Score) - Ganglionic level infarction (caudate, lentiform nuclei, internal capsule, insula, M1-M3 cortex): 6 - Supraganglionic infarction (M4-M6 cortex): 1 Total score (0-10 with 10 being normal): 7 Review of the MIP images confirms the above findings CTA NECK FINDINGS Aortic arch: Visualized aortic arch within normal limits for caliber standard branch pattern. Mild aortic atherosclerosis. No significant stenosis about the origin of the great vessels. Right carotid system: Right common and internal carotid arteries are patent without stenosis or dissection. Mild for age atheromatous change about the right carotid bulb without stenosis. Left carotid system: Left common and internal carotid arteries are patent without stenosis or dissection. Vertebral arteries: Both vertebral arteries arise from subclavian arteries. Vertebral arteries patent without significant stenosis or dissection. Skeleton: No worrisome osseous lesions. Other neck: No other acute finding. 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 Emphysema noted. Coronary artery calcifications noted as well. Review of the MIP images confirms the above findings CTA HEAD FINDINGS Anterior circulation: Mild atheromatous change about the carotid siphons without hemodynamically significant stenosis. A1 segments patent bilaterally. Normal anterior communicating artery complex. Anterior cerebral arteries are patent without stenosis. No M1 stenosis or occlusion. Right MCA branches widely patent and well perfused. On the left, there is an acute proximal M3 stump occlusion, inferior division (series 9, image 137). Diminished arborization within the left MCA territory. Posterior  circulation: Both V4 segments patent without significant stenosis. Neither PICA origin well visualized. Basilar patent without stenosis. Superior cerebellar and posterior cerebral arteries patent bilaterally. Venous sinuses: Bowel assessed due to timing the contrast bolus. Anatomic variants: None significant.  No aneurysm. Review of the MIP images confirms the above findings CT Brain Perfusion Findings: ASPECTS: 7 CBF (<30%) Volume: 20mL Perfusion (Tmax>6.0s) volume: 94mL Mismatch Volume: 74mL Infarction Location:Acute core infarct involving the posterior left frontoparietal region, posterior left MCA territory. 74 mL surrounding ischemic penumbra. IMPRESSION: CT HEAD: 1. Evolving cytotoxic edema involving the left insula and overlying left cerebral hemisphere, consistent with an evolving acute left MCA distribution infarct. No acute intracranial hemorrhage. 2. Aspects = 7. 3. Abnormal hyperdensity involving a proximal left MCA branch, concerning for thrombus. CTA HEAD AND NECK: 1. Acute proximal M3 stump occlusion, inferior division left MCA. 2. Otherwise negative CTA for large vessel occlusion or other emergent finding. 3. Mild for age atheromatous change about the carotid bifurcations and carotid siphons without hemodynamically significant stenosis. 4. Aortic Atherosclerosis (ICD10-I70.0) and Emphysema (ICD10-J43.9). CT PERFUSION: 20 mL acute core infarct involving the posterior left frontoparietal region, posterior left MCA territory. 74 mL surrounding ischemic penumbra. Critical Value/emergent results were called by telephone at the time of interpretation on 07/11/2023 at 11:01 pm to provider NEHA RAY , who verbally acknowledged these results. Electronically Signed   By: Virgia Griffins M.D.   On: 07/11/2023 23:17   CT ANGIO HEAD NECK W WO CM W PERF (CODE STROKE) Result Date: 07/11/2023 CLINICAL DATA:  Initial EXAM: CT ANGIOGRAPHY HEAD AND NECK CT PERFUSION BRAIN TECHNIQUE: Multidetector CT imaging of  the head and  neck was performed using the standard protocol during bolus administration of intravenous contrast. Multiplanar CT image reconstructions and MIPs were obtained to evaluate the vascular anatomy. Carotid stenosis measurements (when applicable) are obtained utilizing NASCET criteria, using the distal internal carotid diameter as the denominator. Multiphase CT imaging of the brain was performed following IV bolus contrast injection. Subsequent parametric perfusion maps were calculated using RAPID software. RADIATION DOSE REDUCTION: This exam was performed according to the departmental dose-optimization program which includes automated exposure control, adjustment of the mA and/or kV according to patient size and/or use of iterative reconstruction technique. CONTRAST:  OMNIPAQUE  IOHEXOL  350 MG/ML SOLN COMPARISON:  None available. FINDINGS: CT HEAD FINDINGS Brain: Evolving cytotoxic edema involving the left insula and posterior left cerebral hemisphere, consistent with an evolving left MCA distribution infarct. Mild patchy involvement of the anterior left frontal lobe as well. Sparing of the basal ganglia. No significant mass effect or midline shift. No hydrocephalus. No acute intracranial hemorrhage. No extra-axial fluid collection. Vascular: Abnormal hyperdensity involving proximal left MCA branch, into and/or M3 segment, concerning for thrombus (series 4, image 13). Skull: Scalp soft tissues within normal limits.  Calvarium intact. Sinuses/Orbits: Globes orbital soft tissues within normal limits. Chronic mucosal thickening present about the right sphenoid sinus. Paranasal sinuses are otherwise clear. No mastoid effusion. Other: None. ASPECTS ([place] Stroke Program Early CT Score) - Ganglionic level infarction (caudate, lentiform nuclei, internal capsule, insula, M1-M3 cortex): 6 - Supraganglionic infarction (M4-M6 cortex): 1 Total score (0-10 with 10 being normal): 7 Review of the MIP images  confirms the above findings CTA NECK FINDINGS Aortic arch: Visualized aortic arch within normal limits for caliber standard branch pattern. Mild aortic atherosclerosis. No significant stenosis about the origin of the great vessels. Right carotid system: Right common and internal carotid arteries are patent without stenosis or dissection. Mild for age atheromatous change about the right carotid bulb without stenosis. Left carotid system: Left common and internal carotid arteries are patent without stenosis or dissection. Vertebral arteries: Both vertebral arteries arise from subclavian arteries. Vertebral arteries patent without significant stenosis or dissection. Skeleton: No worrisome osseous lesions. Other neck: No other acute finding. 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 Emphysema noted. Coronary artery calcifications noted as well. Review of the MIP images confirms the above findings CTA HEAD FINDINGS Anterior circulation: Mild atheromatous change about the carotid siphons without hemodynamically significant stenosis. A1 segments patent bilaterally. Normal anterior communicating artery complex. Anterior cerebral arteries are patent without stenosis. No M1 stenosis or occlusion. Right MCA branches widely patent and well perfused. On the left, there is an acute proximal M3 stump occlusion, inferior division (series 9, image 137). Diminished arborization within the left MCA territory. Posterior circulation: Both V4 segments patent without significant stenosis. Neither PICA origin well visualized. Basilar patent without stenosis. Superior cerebellar and posterior cerebral arteries patent bilaterally. Venous sinuses: Bowel assessed due to timing the contrast bolus. Anatomic variants: None significant.  No aneurysm. Review of the MIP images confirms the above findings CT Brain Perfusion Findings: ASPECTS: 7 CBF (<30%) Volume: 20mL Perfusion (Tmax>6.0s) volume: 94mL Mismatch Volume: 74mL Infarction  Location:Acute core infarct involving the posterior left frontoparietal region, posterior left MCA territory. 74 mL surrounding ischemic penumbra. IMPRESSION: CT HEAD: 1. Evolving cytotoxic edema involving the left insula and overlying left cerebral hemisphere, consistent with an evolving acute left MCA distribution infarct. No acute intracranial hemorrhage. 2. Aspects = 7. 3. Abnormal hyperdensity involving a proximal left MCA branch, concerning for thrombus. CTA HEAD AND NECK:  1. Acute proximal M3 stump occlusion, inferior division left MCA. 2. Otherwise negative CTA for large vessel occlusion or other emergent finding. 3. Mild for age atheromatous change about the carotid bifurcations and carotid siphons without hemodynamically significant stenosis. 4. Aortic Atherosclerosis (ICD10-I70.0) and Emphysema (ICD10-J43.9). CT PERFUSION: 20 mL acute core infarct involving the posterior left frontoparietal region, posterior left MCA territory. 74 mL surrounding ischemic penumbra. Critical Value/emergent results were called by telephone at the time of interpretation on 07/11/2023 at 11:01 pm to provider NEHA RAY , who verbally acknowledged these results. Electronically Signed   By: Virgia Griffins M.D.   On: 07/11/2023 23:17       HISTORY OF PRESENT ILLNESS  68 y.o. 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 Etiology likely cardioembolic in the setting of atrial fibrillation but cannot rule out large vessel disease. ASPECTS 7   CTA head & neck Acute proxima M3 occlusion  CT perfusion 20 mL acute core infarct involving the posterior  left frontoparietal region, posterior left MCA territory. 74 mL surrounding ischemic penumbra Cerebral angio: Suction thrombectomy of left M3 branch occlusion which provide significant perfusion to the posterior left parietal lobe. Post treatment TICI score 2b Post IR CT Nonhemorrhagic left temporal and parietal infarct with expected evolution MRI: Acute left MCA territory infarcts with trace rightward midline shift 2D Echo EF 25 to 30% LDL 44 HgbA1c 6.0 VTE prophylaxis - Restarted on full dose Eliquis   Eliquis  (apixaban ) daily prior to admission, continue  Eliquis  (apixaban ) daily.  Therapy recommendations:  CIR Disposition:  CIR 6/17   Hypertension Home meds:  Valsartan  Stable Permissive hypertension (OK if < 220/120) but gradually normalize in 5-7 days Long-term BP goal normotensive   Hyperlipidemia Home meds:  Atorvastatin, resumed in hospital LDL 44, goal < 70  Continue statin at discharge   Diabetes type II Controlled Home meds:  Metformin  HgbA1c 6.0, goal < 7.0   Other Stroke Risk Factors Advanced Age >/= 39  Cigarette smoker advised to stop smoking Obesity, Body mass index is 35.19 kg/m., BMI >/= 30 associated with increased stroke risk, recommend weight loss, diet and exercise as appropriate  Hx stroke/TIA Coronary artery disease Congestive heart failure   DISCHARGE EXAM Blood pressure 104/75, pulse 84, temperature (!) 97.5 F (36.4 C), temperature source Oral, resp. rate 14, height 6' 3 (1.905 m), weight 127.7 kg, SpO2 96%.  PHYSICAL EXAM Patient awake and alert. Broca's aphasia present. Some improvement in speech seen today.   CN II-XII grossly intact.  Antigravity strength in all 4 extremities with no focal weakness noted. Sensation intact bilaterally Coordination and fine motor movements intact bilaterally.  Ambulating in room with PT.  Discharge NIH: 1   Discharge Diet      Diet   Diet regular Room service appropriate? Yes; Fluid consistency:  Thin   liquids  DISCHARGE PLAN Disposition:  Transfer to [facility] for ongoing PT, OT and ST Continue Eliquis  BID for secondary stroke prevention Recommend ongoing stroke risk factor control by Primary Care Physician at time of discharge from inpatient rehabilitation. Follow-up PCP Patient,  No Pcp Per in 2 weeks following discharge from rehab. Follow-up in [facility] in 8 weeks following discharge from rehab, call office to schedule an appointment. You may also follow-up with a neurologist after your move to [place] if it is within 2-3 months after you are discharged from rehab.  35 minutes were spent preparing discharge.   Audrene Lease, DNP [facility]  I have personally obtained history,examined this patient, reviewed notes, independently viewed imaging studies, participated in medical decision making and plan of care.ROS completed by me personally and pertinent positives fully documented  I have made any additions or clarifications directly to the above note. Agree with note above.    Ardella Beaver, MD Medical Director [facility] Pager: [phone] 07/15/2023 2:48 PM

## 2023-07-15 NOTE — Plan of Care (Signed)
  Problem: Self-Care: Goal: Ability to participate in self-care as condition permits will improve Outcome: Progressing Goal: Verbalization of feelings and concerns over difficulty with self-care will improve Outcome: Progressing Goal: Ability to communicate needs accurately will improve Outcome: Progressing   Problem: Nutrition: Goal: Risk of aspiration will decrease Outcome: Progressing Goal: Dietary intake will improve Outcome: Progressing   Problem: Clinical Measurements: Goal: Ability to maintain clinical measurements within normal limits will improve Outcome: Progressing Goal: Will remain free from infection Outcome: Progressing Goal: Diagnostic test results will improve Outcome: Progressing Goal: Respiratory complications will improve Outcome: Progressing Goal: Cardiovascular complication will be avoided Outcome: Progressing   Problem: Activity: Goal: Risk for activity intolerance will decrease Outcome: Progressing   Problem: Safety: Goal: Ability to remain free from injury will improve Outcome: Progressing   Problem: Skin Integrity: Goal: Risk for impaired skin integrity will decrease Outcome: Progressing

## 2023-07-15 NOTE — Progress Notes (Addendum)
 Inpatient Rehab Admissions Coordinator:    I have a CIR bed for this Pt. Today. RN may call report to [phone].  Pt. Will admit to CIR for an estimated 5--7 days with the goal of dc home with assist from his brother.   Wandalee Gust, MS, CCC-SLP Rehab Admissions Coordinator  [phone] (celll) [phone] (office)

## 2023-07-15 NOTE — TOC Transition Note (Signed)
 Transition of Care [facility]) - Discharge Note   Patient Details  Name: Henry Schneider MRN: [id number] Date of Birth: 1956/01/27  Transition of [facility]) CM/SW Contact:  Jonathan Neighbor, RN Phone Number: 07/15/2023, 11:04 AM   Clinical Narrative:     Pt is discharging to CIR today. No further needs per TOC.   Final next level of care: IP Rehab Facility Barriers to Discharge: No Barriers Identified   Patient Goals and CMS Choice   CMS Medicare.gov Compare Post Acute Care list provided to:: Patient Choice offered to / list presented to : Patient      Discharge Placement                       Discharge Plan and Services Additional resources added to the After Visit Summary for                                       Social Drivers of Health (SDOH) Interventions SDOH Screenings   Tobacco Use: High Risk (07/12/2023)     Readmission Risk Interventions     No data to display

## 2023-07-15 NOTE — Progress Notes (Signed)
 Inpatient Rehabilitation Admission Medication Review by a Pharmacist  A complete drug regimen review was completed for this patient to identify any potential clinically significant medication issues.  High Risk Drug Classes Is patient taking? Indication by Medication  Antipsychotic Yes, as an intravenous medication Prochlorperazine - prn nausea  Anticoagulant Yes Apixaban  - hx PE  Antibiotic No   Opioid No   Antiplatelet No   Hypoglycemics/insulin No   Vasoactive Medication No   Chemotherapy No   Other Yes APAP - prn pain Atorvastatin - HLD Senna-S - bowel regimen     Type of Medication Issue Identified Description of Issue Recommendation(s)  Drug Interaction(s) (clinically significant)     Duplicate Therapy     Allergy     No Medication Administration End Date     Incorrect Dose     Additional Drug Therapy Needed  Dapagliflozin 10mg  daily Lasix 20mg  daily Metformin 500mg  daily Metoprolol XL 100mg  AM, 50mg  PM Spironolactone 12.5mg  daily Valsartan 40 mg daily Consider restarting HF GDMT as blood pressure, electrolytes, and renal function allows.  Monitor CBGs and restart Metformin as indicated.  Significant med changes from prior encounter (inform family/care partners about these prior to discharge).    Other       Clinically significant medication issues were identified that warrant physician communication and completion of prescribed/recommended actions by midnight of the next day:  No  Name of provider notified for urgent issues identified:   Provider Method of Notification:     Pharmacist comments:   Time spent performing this drug regimen review (minutes):  20   Shane Melby, Reba Camper 07/15/2023 9:10 PM

## 2023-07-15 NOTE — H&P (Signed)
 Physical Medicine and Rehabilitation Admission H&P     CC: Functional debility     HPI: Henry Schneider is a 68 year old male with PMHx of PE on Eliquis , possible history of A-fib, history of systolic CHF/an ICM with an EF of 50% (04/2015) with improvement in 2021 to 30% to 35%, status post [facility] CRT-D placement, HTN, HLD, chronic left bundle branch block, PVD, Buerger's disease, emphysema, and tobacco abuse presented to [facility] on 07/11/2023 for evaluation of altered mental status.  While in the ER lobby patient had difficulty finding words and unable to provide history. Code stroke was activated in the setting of aphasia.  LKW unknown per Dr. Sherian Dimitri.  CTA head/neck showed acute proximal M3 stump occlusion, inferior division of the left MCA, otherwise negative. On 07/12/23 Dr. Laverta Potters performed  cerebral angio of left M2/M3 thrombectomy with findings for relatively large transverse M3 branch perfusing the posterior left parietal lobe.  Suction thrombectomy was performed x 3 with partial recanalization. No evidence of hemorrhage.   MRI of brain acute left MCA territory infarcts with trace right word midline shift. Eliquis  was resumed. Echo EF 25%-30%. Stroke felt to be cardioembolic versus LVO but hard to determine per Dr. Janett Medin.  Per chart review the patient is originally from [place]  but has recently relocated to the area where he lives in one level home with 3 steps to enter. PTA pt independent with ADL's currently requiring min A-CGA with mobility and basic ADL's. Therapy evaluations completed due to patient decreased functional mobility was admitted for a comprehensive rehab program.     Review of Systems  Constitutional: Negative.   HENT: Negative.    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    Cardiovascular: Negative.  Negative for chest pain, palpitations and leg swelling.  Gastrointestinal:  Negative.   Genitourinary: Negative.   Musculoskeletal: Negative.   Skin: Negative.   Neurological:  Positive for speech change. Negative for dizziness, tingling, tremors, focal weakness, weakness and headaches.  Endo/Heme/Allergies: Negative.   Psychiatric/Behavioral: Negative.      No past medical history on file.          Past Surgical History:  Procedure Laterality Date   IR CT HEAD LTD   07/12/2023   IR PERCUTANEOUS ART THROMBECTOMY/INFUSION INTRACRANIAL INC DIAG ANGIO   07/12/2023   IR US  GUIDE VASC ACCESS RIGHT   07/12/2023   RADIOLOGY WITH ANESTHESIA N/A 07/12/2023    Procedure: RADIOLOGY WITH ANESTHESIA;  Surgeon: Radiologist, Medication, MD;  Location: [facility] OR;  Service: Radiology;  Laterality: N/A;             Family History  Problem Relation Age of Onset   Coronary artery disease Mother     Coronary artery disease Father          Social History:  reports that he has been smoking cigarettes. He does not have any smokeless tobacco history on file. He reports current alcohol use of about 1.0 standard drink of alcohol per week. No history on file for drug use. Allergies:  Allergies  No Known Allergies         Medications Prior to Admission  Medication Sig Dispense Refill   apixaban  (ELIQUIS ) 5 MG TABS tablet Take 1 tablet (5 mg total) by mouth 2 (two) times daily. 60 tablet 2   atorvastatin (LIPITOR) 40 MG tablet Take 40 mg by mouth daily.  dapagliflozin propanediol (FARXIGA) 10 MG TABS tablet Take 10 mg by mouth daily.       furosemide (LASIX) 20 MG tablet Take 20 mg by mouth daily.       metFORMIN (GLUCOPHAGE) 500 MG tablet Take 500 mg by mouth daily.       metoprolol succinate (TOPROL-XL) 50 MG 24 hr tablet Take 50-100 mg by mouth See admin instructions. Take 2 tablets (100mg ) by mouth in the morning and 1 tablet (50mg ) in the afternoon.       spironolactone (ALDACTONE) 25 MG tablet Take 12.5 mg by mouth daily.       valsartan (DIOVAN) 40 MG tablet Take 40 mg by  mouth daily.                  Home: Home Living Family/patient expects to be discharged to:: Private residence Living Arrangements: Alone Available Help at Discharge: Family Type of Home: House Home Access: Stairs to enter Secretary/administrator of Steps: 3 Entrance Stairs-Rails: Right Home Layout: One level Bathroom Shower/Tub: Engineer, manufacturing systems: Standard Bathroom Accessibility: Yes Additional Comments: family lives in [place]  or [place] and pt here alone. Niece present in room was unable to answer questions about home set-up (per OT eval; no family present during PT Eval)  Lives With: Alone   Functional History: Prior Function Prior Level of Function : Independent/Modified Independent, Driving ADLs Comments: per niece pt does not work anymore   Functional Status:  Mobility: Bed Mobility Overal bed mobility: Needs Assistance Bed Mobility: Supine to Sit Supine to sit: Supervision, HOB elevated General bed mobility comments: OOB upon entry Transfers Overall transfer level: Needs assistance Equipment used: None Transfers: Sit to/from Stand Sit to Stand: Supervision General transfer comment: for safety Ambulation/Gait Ambulation/Gait assistance: Contact guard assist, Min assist Gait Distance (Feet): 140 Feet Assistive device: None Gait Pattern/deviations: Step-through pattern, Decreased stride length, Drifts right/left General Gait Details: Pt tends to veer/sway to his R, intermittently needing minA for balance or to avoid obstacles/objects for his safety. He did bump into obstacles a couple times on the R. Sway noted with simulating stepping over obstacles and with head turns/nods. Gait velocity: reduced Gait velocity interpretation: 1.31 - 2.62 ft/sec, indicative of limited community ambulator   ADL: ADL Overall ADL's : Needs assistance/impaired Eating/Feeding: NPO Grooming: Supervision/safety, Standing Grooming Details (indicate cue type and reason):  cues for sequence Upper Body Bathing: Supervision/ safety, Standing Lower Body Bathing: Contact guard assist, Sit to/from stand Upper Body Dressing : Contact guard assist, Standing Lower Body Dressing: Set up, Sitting/lateral leans, Contact guard assist Lower Body Dressing Details (indicate cue type and reason): for socks Toilet Transfer: Supervision/safety, Ambulation Functional mobility during ADLs: Supervision/safety General ADL Comments: close supervision for safety during mobility   Cognition: Cognition Overall Cognitive Status: Difficult to assess Arousal/Alertness: Awake/alert Orientation Level: Oriented to person, Oriented to place, Oriented to time, Disoriented to situation Cognition Arousal: Alert Behavior During Therapy: WFL for tasks assessed/performed, Impulsive Overall Cognitive Status: Difficult to assess   Physical Exam: Blood pressure 104/75, pulse 84, temperature (!) 97.5 F (36.4 C), temperature source Oral, resp. rate 14, height 6' 3 (1.905 m), weight 127.7 kg, SpO2 96%.   Physical Exam Vitals reviewed.  Constitutional:      General: He is not in acute distress.    Appearance: Normal appearance. He is obese.  HENT:     Right Ear: External ear normal.     Left Ear: External ear normal.     Nose: Nose  normal.     Mouth/Throat:     Mouth: Mucous membranes are moist.     Pharynx: Oropharynx is clear.    Eyes:     Extraocular Movements: Extraocular movements intact.     Conjunctiva/sclera: Conjunctivae normal.     Pupils: Pupils are equal, round, and reactive to light.      Cardiovascular:     Rate and Rhythm: Normal rate and regular rhythm.     Pulses: Normal pulses.     Heart sounds: Normal heart sounds. No murmur heard.    No gallop.  Abdominal:     General: There is no distension.     Palpations: Abdomen is soft.     Tenderness: There is no abdominal tenderness.    Musculoskeletal:        General: No swelling or tenderness. Normal range of  motion.     Cervical back: Normal range of motion and neck supple.    Skin:    General: Skin is warm and dry.     Capillary Refill: Capillary refill takes less than 2 seconds.    Neurological:     Mental Status: He is alert.     Sensory: Sensation is intact.     Motor: Motor function is intact.     Coordination: Coordination is intact.     Gait: Gait is intact.     Comments: Pt is alert, oriented x3 after some cues due to his language. Speech is clear. Pt with word finding deficits and word substitution. He was able to correctly repeat 50% of time. Pt generally able to follow simple commands but occasionally needed verbal and tactile cueing. MMT: pt appears to be 5/5 in BUE prox to distal. He also appeared to be 5/5 in both LE prox to distal. Sensory exam normal for light touch and pain in all 4 limbs. No limb ataxia or cerebellar signs. No abnormal tone appreciated.  Good sitting balance.   Psychiatric:        Mood and Affect: Mood normal.        Behavior: Behavior normal.        Lab Results Last 48 Hours  No results found for this or any previous visit (from the past 48 hours).    Imaging Results (Last 48 hours)  MR BRAIN WO CONTRAST Result Date: 07/14/2023 CLINICAL DATA:  Stroke, follow up EXAM: MRI HEAD WITHOUT CONTRAST TECHNIQUE: Multiplanar, multiecho pulse sequences of the brain and surrounding structures were obtained without intravenous contrast. COMPARISON:  CT head June 14 25. FINDINGS: Brain: Acute left MCA territory infarcts including acute infarcts in left basal ganglia, left insula, overlying left frontal, parietal and temporal lobes. Associated edema with trace (1-2 mm) rightward midline shift. No evidence of acute hemorrhage, mass lesion, or hydrocephalus. Patchy T2/FLAIR hyperintensities in the white matter, compatible with chronic microvascular disease. Vascular: Major arterial flow voids are maintained at the skull base. Skull and upper cervical spine: Normal marrow  signal. Sinuses/Orbits: Mild paranasal sinus mucosal thickening. No acute orbital findings. IMPRESSION: Acute left MCA territory infarcts with trace (1-2 mm) rightward midline shift. Electronically Signed   By: Stevenson Elbe M.D.   On: 07/14/2023 19:21           Blood pressure 104/75, pulse 84, temperature (!) 97.5 F (36.4 C), temperature source Oral, resp. rate 14, height 6' 3 (1.905 m), weight 127.7 kg, SpO2 96%.   Medical Problem List and Plan: 1. Functional deficits secondary to a Left MCA infarct             -  patient may shower             -ELOS/Goals: 5-7 days, sup to mod I with mobility and self-care and supervision with language  2.  Antithrombotics: -DVT/anticoagulation:  Pharmaceutical: Eliquis              -antiplatelet therapy: N/A  3. Pain Management: Tylenol  prn  4. Mood/Behavior/Sleep: LCSW to follow for evaluation and support when available.             -antipsychotic agents: N/A 5. Neuropsych/cognition: This patient is capable of making decisions on his own behalf. 6. Skin/Wound Care: Routine skin checks-monitor R groin incision 7. Fluids/Electrolytes/Nutrition: Monitor I/O  check CMET in a.m.             -Diet Regular changed to Carb modified                8. Hyperlipidemia: LDL 44 Atorvastatin resumed today    9. Hypertension: stable- home Valsartan not resumed               10. T2DM: controlled A1c 6.0.?? resume metformin              - 6/13 glucose 112              -will check a few CBG's. If readings are reasonable then dc checks             -he may not need metformin if he's compliant with CM diet 11. Leukocytosis: WBC 13.9 6/13 monitor repeat CBC in a.m. 12. Tobacco: Discuss importance of smoking cessation and risk factors.  13. Constipation:             -no reported BM since admit              -he appears to be eating             -senna-s 2 tabs at bedtime            Brandi L Leak, NP 07/15/2023  I have personally performed a face to face  diagnostic evaluation of this patient and formulated the key components of the plan.  Additionally, I have personally reviewed laboratory data, imaging studies, as well as relevant notes and concur with the physician assistant's documentation above.  The patient's status has not changed from the original H&P.  Any changes in documentation from the acute care chart have been noted above.  Rawland Caddy, MD, [facility]

## 2023-07-16 DIAGNOSIS — I10 Essential (primary) hypertension: Secondary | ICD-10-CM | POA: Diagnosis not present

## 2023-07-16 DIAGNOSIS — I63312 Cerebral infarction due to thrombosis of left middle cerebral artery: Secondary | ICD-10-CM | POA: Diagnosis not present

## 2023-07-16 LAB — COMPREHENSIVE METABOLIC PANEL WITH GFR
ALT: 21 U/L (ref 0–44)
AST: 21 U/L (ref 15–41)
Albumin: 3 g/dL — ABNORMAL LOW (ref 3.5–5.0)
Alkaline Phosphatase: 58 U/L (ref 38–126)
Anion gap: 8 (ref 5–15)
BUN: 15 mg/dL (ref 8–23)
CO2: 19 mmol/L — ABNORMAL LOW (ref 22–32)
Calcium: 8.7 mg/dL — ABNORMAL LOW (ref 8.9–10.3)
Chloride: 111 mmol/L (ref 98–111)
Creatinine, Ser: 0.9 mg/dL (ref 0.61–1.24)
GFR, Estimated: >60 mL/min (ref >60)
Glucose, Bld: 112 mg/dL — ABNORMAL HIGH (ref 70–99)
Potassium: 4.2 mmol/L (ref 3.5–5.1)
Sodium: 138 mmol/L (ref 135–145)
Total Bilirubin: 1 mg/dL (ref 0.0–1.2)
Total Protein: 5.8 g/dL — ABNORMAL LOW (ref 6.5–8.1)

## 2023-07-16 LAB — CBC WITH DIFFERENTIAL/PLATELET
Abs Immature Granulocytes: 0.02 10*3/uL (ref 0.00–0.07)
Basophils Absolute: 0.1 10*3/uL (ref 0.0–0.1)
Basophils Relative: 1 %
Eosinophils Absolute: 0.5 10*3/uL (ref 0.0–0.5)
Eosinophils Relative: 6 %
HCT: 45.1 % (ref 39.0–52.0)
Hemoglobin: 14.8 g/dL (ref 13.0–17.0)
Immature Granulocytes: 0 %
Lymphocytes Relative: 25 %
Lymphs Abs: 2.2 10*3/uL (ref 0.7–4.0)
MCH: 31.1 pg (ref 26.0–34.0)
MCHC: 32.8 g/dL (ref 30.0–36.0)
MCV: 94.7 fL (ref 80.0–100.0)
Monocytes Absolute: 0.8 10*3/uL (ref 0.1–1.0)
Monocytes Relative: 10 %
Neutro Abs: 5.2 10*3/uL (ref 1.7–7.7)
Neutrophils Relative %: 58 %
Platelets: 145 10*3/uL — ABNORMAL LOW (ref 150–400)
RBC: 4.76 MIL/uL (ref 4.22–5.81)
RDW: 13.2 % (ref 11.5–15.5)
WBC: 8.8 10*3/uL (ref 4.0–10.5)
nRBC: 0 % (ref 0.0–0.2)

## 2023-07-16 LAB — GLUCOSE, CAPILLARY: Glucose-Capillary: 115 mg/dL — ABNORMAL HIGH (ref 70–99)

## 2023-07-16 NOTE — Plan of Care (Signed)
  Problem: RH Bathing Goal: LTG Patient will bathe all body parts with assist levels (OT) Description: LTG: Patient will bathe all body parts with assist levels (OT) Flowsheets (Taken 07/16/2023 1428) LTG: Pt will perform bathing with assistance level/cueing: Independent   Problem: RH Dressing Goal: LTG Patient will perform lower body dressing w/assist (OT) Description: LTG: Patient will perform lower body dressing with assist, with/without cues in positioning using equipment (OT) Flowsheets (Taken 07/16/2023 1428) LTG: Pt will perform lower body dressing with assistance level of: Independent   Problem: RH Toileting Goal: LTG Patient will perform toileting task (3/3 steps) with assistance level (OT) Description: LTG: Patient will perform toileting task (3/3 steps) with assistance level (OT)  Flowsheets (Taken 07/16/2023 1428) LTG: Pt will perform toileting task (3/3 steps) with assistance level: Independent   Problem: RH Simple Meal Prep Goal: LTG Patient will perform simple meal prep w/assist (OT) Description: LTG: Patient will perform simple meal prep with assistance, with/without cues (OT). Flowsheets (Taken 07/16/2023 1428) LTG: Pt will perform simple meal prep with assistance level of: Independent   Problem: RH Laundry Goal: LTG Patient will perform laundry w/assist, cues (OT) Description: LTG: Patient will perform laundry with assistance, with/without cues (OT). Flowsheets (Taken 07/16/2023 1428) LTG: Pt will perform laundry with assistance level of: Independent   Problem: RH Light Housekeeping Goal: LTG Patient will perform light housekeeping w/assist (OT) Description: LTG: Patient will perform light housekeeping with assistance, with/without cues (OT). Flowsheets (Taken 07/16/2023 1428) LTG: Pt will perform light housekeeping with assistance level of: Independent   Problem: RH Toilet Transfers Goal: LTG Patient will perform toilet transfers w/assist (OT) Description: LTG:  Patient will perform toilet transfers with assist, with/without cues using equipment (OT) Flowsheets (Taken 07/16/2023 1428) LTG: Pt will perform toilet transfers with assistance level of: Independent   Problem: RH Tub/Shower Transfers Goal: LTG Patient will perform tub/shower transfers w/assist (OT) Description: LTG: Patient will perform tub/shower transfers with assist, with/without cues using equipment (OT) Flowsheets (Taken 07/16/2023 1428) LTG: Pt will perform tub/shower stall transfers with assistance level of: Independent   Problem: RH Awareness Goal: LTG: Patient will demonstrate awareness during functional activites type of (OT) Description: LTG: Patient will demonstrate awareness during functional activites type of (OT) Flowsheets (Taken 07/16/2023 1428) Patient will demonstrate awareness during functional activites type of: Anticipatory LTG: Patient will demonstrate awareness during functional activites type of (OT): Independent

## 2023-07-16 NOTE — Progress Notes (Signed)
 Patient ID: Henry Schneider, male   DOB: Oct 06, 1955, 68 y.o.   MRN: [id number] Met with the patient to review current medical situation, rehab process, team conference and plan of care. Discussed secondary risk management including A-fib, HF, HTN, HLD and DM.  Note word finding (Broca's aphasia) difficulties but able to make needs known. Appears to understand questions but struggles to answer; does well with lists and written information. Unable to state medications but able to produce a list of home meds and confirms he plans to go home with a Brother in [place]. Sister in law is a patient advocate.  Continue to follow along to address educational needs to facilitate preparation for discharge. Naoma Bacca

## 2023-07-16 NOTE — Progress Notes (Signed)
 Patient ID: Henry Schneider, male   DOB: [birth year], 68 y.o.   MRN: [id number] Met with pt and spoke with Tim-brother via telephone to discuss team conference goals of mod/I and discharge Sat 6/21. Team can not justify him staying any longer with being as high level as he is. Brother had questions for speech therapies and have messaged her to call him. Discussed with going to [place] a MD up there will need to make [facility] referral since they do not take our referrals from here. He has no other needs. Brother to work on getting down here to get pt when discharged.

## 2023-07-16 NOTE — Evaluation (Addendum)
 Occupational Therapy Assessment and Plan  Patient Details  Name: Henry Schneider MRN: [id number] Date of Birth: [birth year]  OT Diagnosis: cognitive deficits and decreased endurance  Rehab Potential: Rehab Potential (ACUTE ONLY): Good ELOS: 3-5 days   Today's Date: 07/16/2023 OT Individual Time: 0845-1000 OT Individual Time Calculation (min): 75 min     Hospital Problem: Principal Problem:   CVA (cerebral vascular accident) [facility])   Past Medical History: History reviewed. No pertinent past medical history. Past Surgical History:  Past Surgical History:  Procedure Laterality Date   IR CT HEAD LTD  07/12/2023   IR PERCUTANEOUS ART THROMBECTOMY/INFUSION INTRACRANIAL INC DIAG ANGIO  07/12/2023   IR US  GUIDE VASC ACCESS RIGHT  07/12/2023   RADIOLOGY WITH ANESTHESIA N/A 07/12/2023   Procedure: RADIOLOGY WITH ANESTHESIA;  Surgeon: Radiologist, Medication, MD;  Location: [facility] OR;  Service: Radiology;  Laterality: N/A;    Assessment & Plan Clinical Impression: Henry Schneider is a 68 year old male with PMHx of PE on Eliquis , possible history of A-fib, history of systolic CHF/an ICM with an EF of 50% (04/2015) with improvement in 2021 to 30% to 35%, status post [facility] Scientific CRT-D placement, HTN, HLD, chronic left bundle branch block, PVD, Buerger's disease, emphysema, and tobacco abuse presented to [facility] on 07/11/2023 for evaluation of altered mental status. While in the ER lobby patient had difficulty finding words and unable to provide history. Code stroke was activated in the setting of aphasia. LKW unknown per Dr. Sherian Dimitri. CTA head/neck showed acute proximal M3 stump occlusion, inferior division of the left MCA, otherwise negative. On 07/12/23 Dr. Laverta Potters performed cerebral angio of left M2/M3 thrombectomy with findings for relatively large transverse M3 branch perfusing the posterior left parietal lobe. Suction thrombectomy was performed x 3 with partial recanalization. No evidence  of hemorrhage. MRI of brain acute left MCA territory infarcts with trace right word midline shift. Eliquis  was resumed. Echo EF 25%-30%. Stroke felt to be cardioembolic versus LVO but hard to determine per Dr. Janett Medin. Per chart review the patient is originally from [place]  but has recently relocated to the area where he lives in one level home with 3 steps to enter. Patient transferred to CIR on 07/15/2023 .    Patient currently requires supervision with basic self-care skills and IADL secondary to muscle weakness, decreased cardiorespiratoy endurance, decreased coordination, decreased safety awareness, central origin, and decreased standing balance, decreased postural control, and decreased balance strategies.  Prior to hospitalization, patient could complete ADLs, IADLs, and drive with independent .  Patient will benefit from skilled intervention to decrease level of assist with basic self-care skills, increase independence with basic self-care skills, and increase level of independence with iADL prior to discharge home with care partner.  Anticipate patient will require intermittent supervision and no further OT follow recommended.  OT - End of Session Activity Tolerance: Decreased this session Endurance Deficit: Yes Endurance Deficit Description: rest breaks provided during ADLs OT Assessment Rehab Potential (ACUTE ONLY): Good OT Patient demonstrates impairments in the following area(s): Balance;Safety;Endurance;Motor OT Basic ADL's Functional Problem(s): Dressing;Toileting;Bathing OT Advanced ADL's Functional Problem(s): Simple Meal Preparation;Light Housekeeping;Laundry OT Transfers Functional Problem(s): Tub/Shower;Toilet OT Plan OT Intensity: Minimum of 1-2 x/day, 45 to 90 minutes OT Frequency: 5 out of 7 days OT Duration/Estimated Length of Stay: 3-5 days OT Treatment/Interventions: Balance/vestibular training;Discharge planning;Pain management;Self Care/advanced ADL  retraining;Therapeutic Activities;UE/LE Coordination activities;Cognitive remediation/compensation;Disease mangement/prevention;Functional mobility training;Patient/family education;Therapeutic Exercise;Community reintegration;DME/adaptive equipment instruction;Neuromuscular re-education;Psychosocial support;UE/LE Strength taining/ROM OT Self Feeding Anticipated Outcome(s): Independent OT Basic Self-Care Anticipated  Outcome(s): Mod I OT Toileting Anticipated Outcome(s): Mod I OT Bathroom Transfers Anticipated Outcome(s): Mod I OT Recommendation Recommendations for Other Services: Therapeutic Recreation consult Therapeutic Recreation Interventions: Kitchen group Patient destination: Home Follow Up Recommendations: None Equipment Recommended: To be determined   OT Evaluation Precautions/Restrictions  Precautions Precautions: Fall Precaution/Restrictions Comments: aphasia Restrictions Weight Bearing Restrictions Per Provider Order: No Home Living/Prior Functioning Home Living Family/patient expects to be discharged to:: Private residence Living Arrangements: Alone Available Help at Discharge: Family Type of Home: House Home Access: Stairs to enter Secretary/administrator of Steps: 3 Entrance Stairs-Rails: Right Home Layout: One level Bathroom Shower/Tub: Engineer, manufacturing systems: Standard Bathroom Accessibility: Yes  Lives With: Alone IADL History Homemaking Responsibilities: Yes Meal Prep Responsibility: Primary Laundry Responsibility: Primary Cleaning Responsibility: Primary Bill Paying/Finance Responsibility: Primary Shopping Responsibility: Primary Child Care Responsibility: No Current License: Yes Mode of Transportation: Car Occupation: 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 Prior Function Level of Independence: Independent with gait, Independent with basic ADLs, Independent with homemaking with ambulation  Able to Take  Stairs?: Yes Driving: Yes Vocation: Retired Administrator, sports Baseline Vision/History: 1 Wears glasses Ability to See in Adequate Light: 0 Adequate Patient Visual Report: No change from baseline Vision Assessment?: Vision impaired- to be further tested in functional context Perception  Perception: Within Functional Limits Praxis Praxis: WFL Cognition Cognition Overall Cognitive Status: Difficult to assess Arousal/Alertness: Awake/alert Orientation Level: Person;Place;Situation Person: Oriented Place: Oriented Situation: Oriented Memory: Appears intact Attention: Sustained Sustained Attention: Appears intact Awareness: Impaired Awareness Impairment: Emergent impairment Problem Solving: Appears intact Safety/Judgment: Impaired Comments: mild safety awareness deficits Brief Interview for Mental Status (BIMS) Repetition of Three Words (First Attempt): 3 Temporal Orientation: Year: Correct (difficult to complete 2/2 expressive aphasia) Temporal Orientation: Month: Accurate within 5 days Temporal Orientation: Day: Incorrect Recall: Sock: Yes, no cue required Recall: Blue: Yes, no cue required Recall: Bed: Yes, no cue required BIMS Summary Score: 14 Sensation Sensation Light Touch: Appears Intact Coordination Gross Motor Movements are Fluid and Coordinated: Yes Fine Motor Movements are Fluid and Coordinated: Yes 9 Hole Peg Test: L-20.34 R-23.87 Motor  Motor Motor: Other (comment) Motor - Skilled Clinical Observations: mild balance deficits during high level dynamic balance tasks  Trunk/Postural Assessment  Cervical Assessment Cervical Assessment: Within Functional Limits Thoracic Assessment Thoracic Assessment: Within Functional Limits Lumbar Assessment Lumbar Assessment: Within Functional Limits Postural Control Postural Control: Deficits on evaluation Righting Reactions: mild delay Protective Responses: mild delay  Balance Balance Balance Assessed: Yes Standardized  Balance Assessment Standardized Balance Assessment: Berg Balance Test Berg Balance Test Sit to Stand: Able to stand without using hands and stabilize independently Standing Unsupported: Able to stand safely 2 minutes Sitting with Back Unsupported but Feet Supported on Floor or Stool: Able to sit safely and securely 2 minutes Stand to Sit: Sits safely with minimal use of hands Transfers: Able to transfer safely, minor use of hands Standing Unsupported with Eyes Closed: Able to stand 10 seconds safely Standing Ubsupported with Feet Together: Able to place feet together independently and stand 1 minute safely From Standing, Reach Forward with Outstretched Arm: Can reach confidently >25 cm (10) From Standing Position, Pick up Object from Floor: Able to pick up shoe safely and easily From Standing Position, Turn to Look Behind Over each Shoulder: Looks behind from both sides and weight shifts well Turn 360 Degrees: Able to turn 360 degrees safely in 4 seconds or less Standing Unsupported, Alternately Place Feet on Step/Stool: Able to stand independently and  safely and complete 8 steps in 20 seconds Standing Unsupported, One Foot in Front: Able to place foot tandem independently and hold 30 seconds Standing on One Leg: Able to lift leg independently and hold equal to or more than 3 seconds Total Score: 54 Static Sitting Balance Static Sitting - Balance Support: Feet supported Static Sitting - Level of Assistance: 7: Independent Dynamic Sitting Balance Dynamic Sitting - Balance Support: Feet supported;During functional activity Dynamic Sitting - Level of Assistance: 6: Modified independent (Device/Increase time) Static Standing Balance Static Standing - Balance Support: During functional activity;No upper extremity supported Static Standing - Level of Assistance: 5: Stand by assistance Dynamic Standing Balance Dynamic Standing - Balance Support: During functional activity Dynamic Standing -  Level of Assistance: 5: Stand by assistance Functional Gait  Assessment Gait assessed : Yes Gait Level Surface: Walks 20 ft in less than 5.5 sec, no assistive devices, good speed, no evidence for imbalance, normal gait pattern, deviates no more than 6 in outside of the 12 in walkway width. Change in Gait Speed: Able to smoothly change walking speed without loss of balance or gait deviation. Deviate no more than 6 in outside of the 12 in walkway width. Gait with Horizontal Head Turns: Performs head turns smoothly with no change in gait. Deviates no more than 6 in outside 12 in walkway width Gait with Vertical Head Turns: Performs head turns with no change in gait. Deviates no more than 6 in outside 12 in walkway width. Gait and Pivot Turn: Pivot turns safely within 3 sec and stops quickly with no loss of balance. Step Over Obstacle: Is able to step over 2 stacked shoe boxes taped together (9 in total height) without changing gait speed. No evidence of imbalance. Gait with Narrow Base of Support: Is able to ambulate for 10 steps heel to toe with no staggering. Gait with Eyes Closed: Walks 20 ft, no assistive devices, good speed, no evidence of imbalance, normal gait pattern, deviates no more than 6 in outside 12 in walkway width. Ambulates 20 ft in less than 7 sec. Ambulating Backwards: Walks 20 ft, no assistive devices, good speed, no evidence for imbalance, normal gait Steps: Alternating feet, no rail. Total Score: 30 Extremity/Trunk Assessment RUE Assessment RUE Assessment: Within Functional Limits Active Range of Motion (AROM) Comments: WFL General Strength Comments: Grip 110 lbs, 5/5 LUE Assessment LUE Assessment: Within Functional Limits Active Range of Motion (AROM) Comments: WFL General Strength Comments: Grip 108 lbs, 5/5  Care Tool Care Tool Self Care Eating        Oral Care         Bathing              Upper Body Dressing(including orthotics)            Lower Body  Dressing (excluding footwear)          Putting on/Taking off footwear             Care Tool Toileting Toileting activity         Care Tool Bed Mobility Roll left and right activity        Sit to lying activity        Lying to sitting on side of bed activity         Care Tool Transfers Sit to stand transfer        Chair/bed transfer         Toilet transfer         Care  Tool Cognition  Expression of Ideas and Wants Expression of Ideas and Wants: 2. Frequent difficulty - frequently exhibits difficulty with expressing needs and ideas  Understanding Verbal and Non-Verbal Content Understanding Verbal and Non-Verbal Content: 3. Usually understands - understands most conversations, but misses some part/intent of message. Requires cues at times to understand   Memory/Recall Ability Memory/Recall Ability : Current season;That he or she is in a hospital/hospital unit   Refer to Care Plan for Long Term Goals  SHORT TERM GOAL WEEK 1 OT Short Term Goal 1 (Week 1): STG=LTG d/t ELOS  Recommendations for other services: None    Skilled Therapeutic Intervention Skilled Therapeutic Interventions/Progress Updates:  1:1 OT evaluation and intervention initiated with skilled education provided on OT role, goals, and POC. Pt received sitting up in recliner presenting to be in good spirits receptive to skilled OT session reporting 0/10 pain- OT offering intermittent rest breaks, repositioning, and therapeutic support to optimize participation in therapy session. Pt completed shower level ADL this session at levels listed below, no AD required throughout session. Pt noted to have mild dynamic balance deficits when completing high level balance activities, decreased safety awareness, and decreased functional cognition skills during higher level IADLs. Pt would benefit from additional OT services in IPR setting. Pt was left resting in recliner with call bell in reach and all needs met.    ADL   Mobility  Bed Mobility Bed Mobility: Rolling Right;Rolling Left;Supine to Sit;Sit to Supine Rolling Right: Independent with assistive device Rolling Left: Independent with assistive device Supine to Sit: Independent with assistive device Sit to Supine: Independent with assistive device Transfers Sit to Stand: Supervision/Verbal cueing Stand to Sit: Supervision/Verbal cueing   Discharge Criteria: Patient will be discharged from OT if patient refuses treatment 3 consecutive times without medical reason, if treatment goals not met, if there is a change in medical status, if patient makes no progress towards goals or if patient is discharged from hospital.  The above assessment, treatment plan, treatment alternatives and goals were discussed and mutually agreed upon: by patient  Geoffery Kiel 07/16/2023, 2:20 PM

## 2023-07-16 NOTE — Plan of Care (Signed)
  Problem: Consults Goal: RH STROKE PATIENT EDUCATION Description: See Patient Education module for education specifics  Outcome: Progressing   Problem: RH SAFETY Goal: RH STG ADHERE TO SAFETY PRECAUTIONS W/ASSISTANCE/DEVICE Description: STG Adhere to Safety Precautions With cues Assistance/Device. Outcome: Progressing   Problem: RH KNOWLEDGE DEFICIT Goal: RH STG INCREASE KNOWLEDGE OF DIABETES Description: Patient and brother will be able to manage DM using educational resources for medications and dietary modification recommendations independently Outcome: Progressing Goal: RH STG INCREASE KNOWLEDGE OF HYPERTENSION Description: Patient and brother will be able to manage HTN using educational resources for medications and dietary modification recommendations independently Outcome: Progressing Goal: RH STG INCREASE KNOWLEGDE OF HYPERLIPIDEMIA Description: Patient and brother will be able to manage HLD using educational resources for medications and dietary modification recommendations independently Outcome: Progressing Goal: RH STG INCREASE KNOWLEDGE OF STROKE PROPHYLAXIS Description: Patient and brother will be able to manage secondary risk using educational resources for medications and dietary modification recommendations independently Outcome: Progressing

## 2023-07-16 NOTE — Plan of Care (Signed)
  Problem: RH Comprehension Communication Goal: LTG Patient will comprehend basic/complex auditory (SLP) Description: LTG: Patient will comprehend basic/complex auditory information with cues (SLP). Flowsheets (Taken 07/16/2023 1214) LTG: Patient will comprehend: Complex auditory information LTG: Patient will comprehend auditory information with cueing (SLP): Moderate Assistance - Patient 50 - 74%   Problem: RH Expression Communication Goal: LTG Patient will express needs/wants via multi-modal(SLP) Description: LTG:  Patient will express needs/wants via multi-modal communication (gestures/written, etc) with cues (SLP) Flowsheets (Taken 07/16/2023 1214) LTG: Patient will express needs/wants via multimodal communication (gestures/written, etc) with cueing (SLP): Moderate Assistance - Patient 50 - 74% Goal: LTG Patient will increase word finding of common (SLP) Description: LTG:  Patient will increase word finding of common objects/daily info/abstract thoughts with cues using compensatory strategies (SLP). Flowsheets (Taken 07/16/2023 1214) LTG: Patient will increase word finding of common (SLP): Moderate Assistance - Patient 50 - 74% Patient will use compensatory strategies to increase word finding of: Abstract thoughts

## 2023-07-16 NOTE — Progress Notes (Signed)
 Inpatient Rehabilitation Care Coordinator Assessment and Plan Patient Details  Name: Henry Schneider MRN: [id number] Date of Birth: November 19, 1955  Today's Date: 07/16/2023  Hospital Problems: Principal Problem:   CVA (cerebral vascular accident) [facility])  Past Medical History: History reviewed. No pertinent past medical history. Past Surgical History:  Past Surgical History:  Procedure Laterality Date   IR CT HEAD LTD  07/12/2023   IR PERCUTANEOUS ART THROMBECTOMY/INFUSION INTRACRANIAL INC DIAG ANGIO  07/12/2023   IR US  GUIDE VASC ACCESS RIGHT  07/12/2023   RADIOLOGY WITH ANESTHESIA N/A 07/12/2023   Procedure: RADIOLOGY WITH ANESTHESIA;  Surgeon: Radiologist, Medication, MD;  Location: [facility] OR;  Service: Radiology;  Laterality: N/A;   Social History:  reports that he has been smoking cigarettes. He does not have any smokeless tobacco history on file. He reports current alcohol use of about 1.0 standard drink of alcohol per week. No history on file for drug use.  Family / Support Systems Marital Status: Single Patient Roles: Other (Comment) (brother) Other Supports: Henry Schneider [phone] Anticipated Caregiver: Wandalee Gust and his family Ability/Limitations of Caregiver: supervision level-will need to come from [place] to get pt Caregiver Availability: 24/7 Family Dynamics: Close with brother how lives out of state. Pt has no family in [place] but [place] , [place] and [place].  Social History Preferred language: English Religion:  Cultural Background: NA Education: HS Health Literacy - How often do you need to have someone help you when you read instructions, pamphlets, or other written material from your doctor or pharmacy?: Never Writes: Yes Employment Status: Retired Marine scientist Issues: NA Guardian/Conservator: None-according to MD pt is capable of making his own decisions while here   Abuse/Neglect Abuse/Neglect Assessment Can Be Completed: Yes Physical Abuse: Denies Verbal Abuse:  Denies Sexual Abuse: Denies Exploitation of patient/patient's resources: Denies Self-Neglect: Denies  Patient response to: Social Isolation - How often do you feel lonely or isolated from those around you?: Rarely  Emotional Status Pt's affect, behavior and adjustment status: Pt is motivated to do well and is mod/i in his room on first day here. His main issue is his speech and need for supervision due to this issue Recent Psychosocial Issues: other health issues Psychiatric History: No history Substance Abuse History: NA  Patient / Family Perceptions, Expectations & Goals Pt/Family understanding of illness & functional limitations: Pt and brother explain he is doing well and thought would be here 7-10 days and told this by [facility]. Discussed team evalauted and at mod/i level, only need is Speech. 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  Henry Schneider states  I thought was told would be there longer than three days.  Community Resources [employer]: None Premorbid Home Care/DME Agencies: None Transportation available at discharge: self Is the patient able to respond to transportation needs?: Yes In the past 12 months, has lack of transportation kept you from medical appointments or from getting medications?: No In the past 12 months, has lack of transportation kept you from meetings, work, or from getting things needed for daily living?: No  Discharge Planning Living Arrangements: Alone Support Systems: Other relatives Type of Residence: Private residence Insurance Resources: [employer] Financial Resources: Social Security Financial Screen Referred: No Living Expenses: Own Money Management: Patient Does the patient have any problems obtaining your medications?: No Home Management:  self Patient/Family Preliminary Plans: Plans to go to brother's home in [place] and he will need  to come down here and get pt and take back. Care Coordinator Anticipated Follow Up Needs: HH/OP  Clinical Impression Pleasant gentleman who is high level and only need is OP Speech. Informed brother on dc sat 6/21 and need to come down to get him. He will try to make arrangements. Will need a MD up there to make OP speech referral since will not take our referral.  Mardell Shade 07/16/2023, 2:46 PM

## 2023-07-16 NOTE — Evaluation (Signed)
 Speech Language Pathology Assessment and Plan  Patient Details  Name: Henry Schneider MRN: [id number] Date of Birth: 12/06/1955  SLP Diagnosis: Aphasia;Apraxia  Rehab Potential: Excellent ELOS: 5-7 days    Today's Date: 07/16/2023 SLP Individual Time: 4098-1191 SLP Individual Time Calculation (min): 57 min   Hospital Problem: Principal Problem:   CVA (cerebral vascular accident) [facility])  Past Medical History: History reviewed. No pertinent past medical history. Past Surgical History:  Past Surgical History:  Procedure Laterality Date   IR CT HEAD LTD  07/12/2023   IR PERCUTANEOUS ART THROMBECTOMY/INFUSION INTRACRANIAL INC DIAG ANGIO  07/12/2023   IR US  GUIDE VASC ACCESS RIGHT  07/12/2023   RADIOLOGY WITH ANESTHESIA N/A 07/12/2023   Procedure: RADIOLOGY WITH ANESTHESIA;  Surgeon: Radiologist, Medication, MD;  Location: [facility] OR;  Service: Radiology;  Laterality: N/A;    Assessment / Plan / Recommendation Clinical Impression HPI: Henry Schneider is a 68 y.o. male with hx of PE on Eliquis , possibly history of A-fib as well, history of systolic CHF/NICM with an EF of 15% in April 2017 with improvement in 2021 to 30% to 35%, status post [facility] Scientific CRT-D placement, HTN, HLD, chronic left bundle branch block, peripheral vascular disease, Buerger's disease, emphysema, tobacco abuse,  On 07/12/23, Pt. Presented to [facility] for AMS. Code stroke was activated. Teleneurology MD evaluated the patient. CT head showed evolving cytotoxic edema involving left insula and overlying left cerebral hemisphere consistent with evolving acute left MCA distribution infarct, aspects score 7, abnormal hyperdense proximal left MCA branch concerning for thrombus. CT angiography head and neck with acute proximal M3 stump occlusion, inferior division of the left MCA, otherwise negative. He was transferred to [facility] 07/12/23 for cerebral Angiogram and thrombectomy. He was seen by  PT/OT/SLP and they recommended CIR to assist return to PLOF.   Clinical Impression:  Bedside Swallow Evaluation: A bedside swallow evaluation was completed to assess for s/sx of oropharyngeal dysphagia. Oral mechanism exam WFL. POs administered included thin liquids, purees and solids. Patient with timely mastication and complete oral clearance. No s/sx of aspiration present. Recommend regular/thin diet with use of standardized precautions including sitting upright during PO and taking small bites/sips at a slow rate. No further ST needs regarding swallowing.  Communication: Patient presents with moderate expressive/receptive language deficits and apraxia. Expressive language is characterized by neologisms, phonemic paraphasias and motor speech errors that he is inconsistently aware of. Patient independently recalls name, 1-10, and days of the week/months of the year, with x1 phonemic paraphasia during verbalization of June. Patient with increasing difficulty during confrontational naming and repetition characterized by phonemic substitutions at the word level and word finding difficulty. Writing is occasionally beneficial to aid in communication. Receptive language is characterized by 90% accuracy during yes/no questions (improvement from acute) and 100% accuracy during single step commands. Accuracy declines to ~50-60% as commands increase in complexity. Patient with 85% accuracy during auditory comprehension task where patient was instructed to point to item verbalized by SLP.  Cognition: Patients cognitive abilities are seemingly intact, though difficult to assess due to language deficits. Patient is oriented to self, situation, time and location. He recalled biographical information including prior job, family, and where he lives. Sustained attention to task is functional. Recommend continuing to assess cognitive abilities as language improves.  Pt would benefit from skilled ST services to maximize  communication in order to maximize functional independence at d/c. Anticipate patient will require supervision at d/c and f/u SLP services.  Skilled Therapeutic Interventions          Patient evaluated using a standardized cognitive linguistic assessment and bedside swallow evaluation to assess current cognitive, communicative and swallowing function. See above for details.    SLP Assessment  Patient will need skilled Speech Lanaguage Pathology Services during CIR admission    Recommendations  SLP Diet Recommendations: Age appropriate regular solids;Thin Liquid Administration via: Cup;Straw Medication Administration: Whole meds with liquid Supervision: Patient able to self feed Compensations: Minimize environmental distractions;Slow rate;Small sips/bites Postural Changes and/or Swallow Maneuvers: Seated upright 90 degrees Oral Care Recommendations: Oral care BID Patient destination: Home Follow up Recommendations: Outpatient SLP    SLP Frequency 3 to 5 out of 7 days   SLP Duration  SLP Intensity  SLP Treatment/Interventions 5-7 days  Minumum of 1-2 x/day, 30 to 90 minutes  Internal/external aids;Speech/Language facilitation;Cueing hierarchy;Therapeutic Activities;Functional tasks;Multimodal communication approach;Patient/family education    Pain None reported  SLP Evaluation Cognition Overall Cognitive Status: Difficult to assess Arousal/Alertness: Awake/alert Orientation Level: Oriented X4 Year: 2025 Month: June Day of Week: Incorrect (thursday) Attention: Sustained Sustained Attention: Appears intact Memory: Appears intact Awareness: Impaired Awareness Impairment: Emergent impairment Problem Solving: Appears intact  Comprehension Auditory Comprehension Overall Auditory Comprehension: Impaired Yes/No Questions: Impaired Basic Biographical Questions: 76-100% accurate Basic Immediate Environment Questions: 75-100% accurate Complex Questions: 75-100%  accurate Commands: Impaired One Step Basic Commands: 75-100% accurate Two Step Basic Commands: 25-49% accurate Multistep Basic Commands: 25-49% accurate Complex Commands: 25-49% accurate EffectiveTechniques: Repetition;Extra processing time Expression Expression Primary Mode of Expression: Verbal Verbal Expression Overall Verbal Expression: Impaired Initiation: No impairment Automatic Speech: Name;Counting;Day of week;Month of year Level of Generative/Spontaneous Verbalization: Conversation Repetition: Impaired Level of Impairment: Word level Naming: Impairment Confrontation: Impaired Verbal Errors: Neologisms;Perseveration;Aware of errors;Not aware of errors;Phonemic paraphasias Pragmatics: No impairment Written Expression Dominant Hand: Right Oral Motor Oral Motor/Sensory Function Overall Oral Motor/Sensory Function: Within functional limits Motor Speech Overall Motor Speech: Appears within functional limits for tasks assessed  Care Tool Care Tool Cognition Ability to hear (with hearing aid or hearing appliances if normally used Ability to hear (with hearing aid or hearing appliances if normally used): 0. Adequate - no difficulty in normal conservation, social interaction, listening to TV   Expression of Ideas and Wants Expression of Ideas and Wants: 2. Frequent difficulty - frequently exhibits difficulty with expressing needs and ideas   Understanding Verbal and Non-Verbal Content Understanding Verbal and Non-Verbal Content: 3. Usually understands - understands most conversations, but misses some part/intent of message. Requires cues at times to understand  Memory/Recall Ability Memory/Recall Ability : Current season;That he or she is in a hospital/hospital unit   Bedside Swallowing Assessment General Date of Onset: 07/16/23  Ice Chips Ice chips: Not tested Thin Liquid Thin Liquid: Within functional limits Nectar Thick Nectar Thick Liquid: Not tested Honey  Thick Honey Thick Liquid: Not tested Puree Puree: Within functional limits Presentation: Self Fed;Spoon Solid Solid: Within functional limits Presentation: Self Fed BSE Assessment Risk for Aspiration Impact on safety and function: No limitations  Short Term Goals: Week 1: SLP Short Term Goal 1 (Week 1): STG = LTG due to elos  Refer to Care Plan for Long Term Goals  Recommendations for other services: None   Discharge Criteria: Patient will be discharged from SLP if patient refuses treatment 3 consecutive times without medical reason, if treatment goals not met, if there is a change in medical status, if patient makes no progress towards goals or if patient is discharged from hospital.  The above assessment, treatment plan, treatment alternatives and goals were discussed and mutually agreed upon: by patient  Khalon Cansler M.A., CCC-SLP 07/16/2023, 12:10 PM

## 2023-07-16 NOTE — Progress Notes (Signed)
 PROGRESS NOTE   Subjective/Complaints: Aphasia moderate  ROS- limited by above    Objective:   MR BRAIN WO CONTRAST Result Date: 07/14/2023 CLINICAL DATA:  Stroke, follow up EXAM: MRI HEAD WITHOUT CONTRAST TECHNIQUE: Multiplanar, multiecho pulse sequences of the brain and surrounding structures were obtained without intravenous contrast. COMPARISON:  CT head June 14 25. FINDINGS: Brain: Acute left MCA territory infarcts including acute infarcts in left basal ganglia, left insula, overlying left frontal, parietal and temporal lobes. Associated edema with trace (1-2 mm) rightward midline shift. No evidence of acute hemorrhage, mass lesion, or hydrocephalus. Patchy T2/FLAIR hyperintensities in the white matter, compatible with chronic microvascular disease. Vascular: Major arterial flow voids are maintained at the skull base. Skull and upper cervical spine: Normal marrow signal. Sinuses/Orbits: Mild paranasal sinus mucosal thickening. No acute orbital findings. IMPRESSION: Acute left MCA territory infarcts with trace (1-2 mm) rightward midline shift. Electronically Signed   By: Stevenson Elbe M.D.   On: 07/14/2023 19:21   Recent Labs    07/16/23 0508  WBC 8.8  HGB 14.8  HCT 45.1  PLT 145*   Recent Labs    07/16/23 0508  NA 138  K 4.2  CL 111  CO2 19*  GLUCOSE 112*  BUN 15  CREATININE 0.90  CALCIUM 8.7*    Intake/Output Summary (Last 24 hours) at 07/16/2023 0756 Last data filed at 07/16/2023 0745 Gross per 24 hour  Intake 720 ml  Output --  Net 720 ml        Physical Exam: Vital Signs Blood pressure 130/82, pulse 75, temperature 97.7 F (36.5 C), temperature source Oral, resp. rate 18, height 6' 3 (1.905 m), weight 129 kg, SpO2 98%.   General: No acute distress Mood and affect are appropriate Heart: Regular rate and rhythm no rubs murmurs or extra sounds Lungs: Clear to auscultation, breathing unlabored, no  rales or wheezes Abdomen: Positive bowel sounds, soft nontender to palpation, nondistended Extremities: No clubbing, cyanosis, or edema Skin: No evidence of breakdown, no evidence of rash Neurologic: Cranial nerves II through XII intact, motor strength is 5/5 in bilateral deltoid, bicep, tricep, grip, hip flexor, knee extensors, ankle dorsiflexor and plantar flexor- needed visual/gestural cuing due to apraxia Reduced naming, oriented to person/place, not time but this may be more due to naming issues  Sensory exam difficult to assess due to aphasia   Musculoskeletal: Full range of motion in all 4 extremities except Right ankle mildly diminished DF/PF. No joint swelling   Assessment/Plan: 1. Functional deficits which require 3+ hours per day of interdisciplinary therapy in a comprehensive inpatient rehab setting. Physiatrist is providing close team supervision and 24 hour management of active medical problems listed below. Physiatrist and rehab team continue to assess barriers to discharge/monitor patient progress toward functional and medical goals  Care Tool:  Bathing              Bathing assist       Upper Body Dressing/Undressing Upper body dressing        Upper body assist      Lower Body Dressing/Undressing Lower body dressing            Lower body  assist       Toileting Toileting    Toileting assist       Transfers Chair/bed transfer  Transfers assist           Locomotion Ambulation   Ambulation assist              Walk 10 feet activity   Assist           Walk 50 feet activity   Assist           Walk 150 feet activity   Assist           Walk 10 feet on uneven surface  activity   Assist           Wheelchair     Assist               Wheelchair 50 feet with 2 turns activity    Assist            Wheelchair 150 feet activity     Assist          Blood pressure 130/82, pulse  75, temperature 97.7 F (36.5 C), temperature source Oral, resp. rate 18, height 6' 3 (1.905 m), weight 129 kg, SpO2 98%.  Medical Problem List and Plan: 1. 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  Antithrombotics: -DVT/anticoagulation:  Pharmaceutical: Eliquis              -antiplatelet therapy: N/A  3. Pain Management: Tylenol  prn  Hx of Right leg (? Tibial ) fx and chronic Right ankle stiffness  4. Mood/Behavior/Sleep: LCSW to follow for evaluation and support when available.             -antipsychotic agents: N/A 5. Neuropsych/cognition: This patient is capable of making decisions on his own behalf. 6. Skin/Wound Care: Routine skin checks-monitor R groin incision 7. Fluids/Electrolytes/Nutrition: Monitor I/O  check CMET in a.m.             -Diet Regular changed to Carb modified                8. Hyperlipidemia: LDL 44 Atorvastatin resumed today    9. Hypertension: stable- home Valsartan not resumed  Vitals:   07/15/23 1959 07/16/23 0516  BP: 129/86 130/82  Pulse: 86 75  Resp: 18 18  Temp: 97.8 F (36.6 C) 97.7 F (36.5 C)  SpO2: 98% 98%                 10. T2DM: controlled A1c 6.0.?? resume metformin              - 6/13 glucose 112              -will check a few CBG's. If readings are reasonable then dc checks             -he may not need metformin if he's compliant with CM diet CBG (last 3)  Recent Labs    07/16/23 0620  GLUCAP 115*    11. Leukocytosis: WBC 13.9 6/13 monitor repeat CBC in a.m.    Latest Ref Rng & Units 07/16/2023  5:08 AM 07/11/2023   10:40 PM 12/20/2014    8:28 AM  CBC  WBC 4.0 - 10.5 K/uL 8.8  13.9  9.7   Hemoglobin 13.0 - 17.0 g/dL 16.1  09.6  04.5   Hematocrit 39.0 - 52.0 % 45.1  49.9  45.1    Platelets 150 - 400 K/uL 145  189  266     12. Tobacco: Discuss importance of smoking cessation and risk factors.  13. Constipation:             -no reported BM since admit              -he appears to be eating             -senna-s 2 tabs at bedtime             LOS: 1 days A FACE TO FACE EVALUATION WAS PERFORMED  [name] 07/16/2023, 7:56 AM

## 2023-07-16 NOTE — Evaluation (Signed)
 Physical Therapy Assessment and Plan  Patient Details  Name: Henry Schneider MRN: [id number] Date of Birth: 09/09/1955  PT Diagnosis: Abnormality of gait, Ataxia, and Difficulty walking Rehab Potential: Excellent ELOS: 3-5   Today's Date: 07/16/2023 PT Individual Time: 8295-6213 PT Individual Time Calculation (min): 74 min    Hospital Problem: Principal Problem:   CVA (cerebral vascular accident) [facility])   Past Medical History: History reviewed. No pertinent past medical history. Past Surgical History:  Past Surgical History:  Procedure Laterality Date   IR CT HEAD LTD  07/12/2023   IR PERCUTANEOUS ART THROMBECTOMY/INFUSION INTRACRANIAL INC DIAG ANGIO  07/12/2023   IR US  GUIDE VASC ACCESS RIGHT  07/12/2023   RADIOLOGY WITH ANESTHESIA N/A 07/12/2023   Procedure: RADIOLOGY WITH ANESTHESIA;  Surgeon: Radiologist, Medication, MD;  Location: [facility] OR;  Service: Radiology;  Laterality: N/A;    Assessment & Plan Clinical Impression: Patient is a 68 year old male with PMHx of PE on Eliquis , possible history of A-fib, history of systolic CHF/an ICM with an EF of 50% (04/2015) with improvement in 2021 to 30% to 35%, status post [facility] CRT-D placement, HTN, HLD, chronic left bundle branch block, PVD, Buerger's disease, emphysema, and tobacco abuse presented to [facility] on 07/11/2023 for evaluation of altered mental status. While in the ER lobby patient had difficulty finding words and unable to provide history. Code stroke was activated in the setting of aphasia. LKW unknown per Dr. Sherian Dimitri. CTA head/neck showed acute proximal M3 stump occlusion, inferior division of the left MCA, otherwise negative. On 07/12/23 Dr. Laverta Potters performed cerebral angio of left M2/M3 thrombectomy with findings for relatively large transverse M3 branch perfusing the posterior left parietal lobe. Suction thrombectomy was performed x 3 with partial recanalization. No evidence of hemorrhage. MRI of brain acute  left MCA territory infarcts with trace right word midline shift. Eliquis  was resumed. Echo EF 25%-30%. Stroke felt to be cardioembolic versus LVO but hard to determine per Dr. Janett Medin. Per chart review the patient is originally from [place]  but has recently relocated to the area where he lives in one level home with 3 steps to enter. PTA pt independent with ADL's currently requiring min A-CGA with mobility and basic ADL's. Therapy evaluations completed due to patient decreased functional mobility was admitted for a comprehensive rehab program.  Patient transferred to CIR on 07/15/2023 .   Patient currently requires supervision with mobility secondary to decreased coordination.  Prior to hospitalization, patient was independent  with mobility and lived with Alone in a House home.  Home access is 3Stairs to enter.  Patient will benefit from skilled PT intervention to maximize safe functional mobility for planned discharge home with intermittent assist.  Anticipate patient will not need PT follow up at discharge.  PT - End of Session Activity Tolerance: Tolerates 30+ min activity without fatigue Endurance Deficit: No Endurance Deficit Description: walked around facility, outside +1000 ft without rest breaks PT Assessment Rehab Potential (ACUTE/IP ONLY): Excellent PT Barriers to Discharge: Lack of/limited family support PT Patient demonstrates impairments in the following area(s): Safety;Other (comment) (high level gait, stepping with R foot clearence, and dual tasking gait) PT Locomotion Functional Problem(s): Ambulation (dual tasking with gait) PT Plan PT Intensity: Minimum of 1-2 x/day ,45 to 90 minutes PT Frequency: 5 out of 7 days PT Duration Estimated Length of Stay: 3-5 days PT Treatment/Interventions: Ambulation/gait training;Therapeutic Exercise;Stair training;Balance/vestibular training;Neuromuscular re-education;Community reintegration;Patient/family education;UE/LE Coordination  activities;UE/LE Strength taining/ROM;Therapeutic Activities;Functional mobility training;Discharge planning;Cognitive remediation/compensation;Disease management/prevention PT Transfers Anticipated Outcome(s): Independent  PT Locomotion Anticipated Outcome(s): Independent PT Recommendation Recommendations for Other Services: Speech consult Follow Up Recommendations: None Patient destination: Home Equipment Recommended: None recommended by PT   PT Evaluation Precautions/Restrictions Precautions Precautions: Fall Precaution/Restrictions Comments: aphasia Restrictions Weight Bearing Restrictions Per Provider Order: No  Pain Interference Pain Interference Pain Effect on Sleep: 1. Rarely or not at all Pain Interference with Therapy Activities: 1. Rarely or not at all Pain Interference with Day-to-Day Activities: 1. Rarely or not at all Home Living/Prior Functioning Home Living Available Help at Discharge: Family Type of Home: House Home Access: Stairs to enter Secretary/administrator of Steps: 3 Entrance Stairs-Rails: Right Home Layout: One level Bathroom Shower/Tub: Engineer, manufacturing systems: Standard Bathroom Accessibility: Yes  Lives With: Alone Prior Function Level of Independence: Independent with gait;Independent with basic ADLs;Independent with homemaking with ambulation  Able to Take Stairs?: Yes Driving: Yes Vocation: Retired Vision/Perception  Vision - History Ability to See in Adequate Light: 0 Adequate Perception Perception: Within Functional Limits Praxis Praxis: WFL  Cognition Overall Cognitive Status: Difficult to assess Arousal/Alertness: Awake/alert Orientation Level: Oriented X4 Year: 2025 Month: June Day of Week: Incorrect (He thought today was Monday) Attention: Sustained Sustained Attention: Appears intact Memory: Appears intact Awareness: Appears intact Awareness Impairment: Emergent impairment Problem Solving: Appears  intact Safety/Judgment: Impaired Comments: mild safety awareness deficits Sensation Sensation Light Touch: Appears Intact Coordination Gross Motor Movements are Fluid and Coordinated: Yes Fine Motor Movements are Fluid and Coordinated: Yes 9 Hole Peg Test: L-20.34 R-23.87 Motor  Motor Motor: Within Functional Limits Motor - Skilled Clinical Observations: mild balance deficits during high level dynamic balance tasks   Trunk/Postural Assessment  Cervical Assessment Cervical Assessment: Within Functional Limits Thoracic Assessment Thoracic Assessment: Within Functional Limits Lumbar Assessment Lumbar Assessment: Within Functional Limits Postural Control Postural Control: Within Functional Limits Righting Reactions: mild delay Protective Responses: mild delay  Balance Balance Balance Assessed: Yes Standardized Balance Assessment Standardized Balance Assessment: Berg Balance Test Berg Balance Test Sit to Stand: Able to stand without using hands and stabilize independently Standing Unsupported: Able to stand safely 2 minutes Sitting with Back Unsupported but Feet Supported on Floor or Stool: Able to sit safely and securely 2 minutes Stand to Sit: Sits safely with minimal use of hands Transfers: Able to transfer safely, minor use of hands Standing Unsupported with Eyes Closed: Able to stand 10 seconds safely Standing Ubsupported with Feet Together: Able to place feet together independently and stand 1 minute safely From Standing, Reach Forward with Outstretched Arm: Can reach confidently >25 cm (10) From Standing Position, Pick up Object from Floor: Able to pick up shoe safely and easily From Standing Position, Turn to Look Behind Over each Shoulder: Looks behind from both sides and weight shifts well Turn 360 Degrees: Able to turn 360 degrees safely in 4 seconds or less Standing Unsupported, Alternately Place Feet on Step/Stool: Able to stand independently and safely and  complete 8 steps in 20 seconds Standing Unsupported, One Foot in Front: Able to place foot tandem independently and hold 30 seconds Standing on One Leg: Able to lift leg independently and hold equal to or more than 3 seconds Total Score: 54 Static Sitting Balance Static Sitting - Balance Support: Feet supported Static Sitting - Level of Assistance: 7: Independent Dynamic Sitting Balance Dynamic Sitting - Balance Support: Feet supported;During functional activity Dynamic Sitting - Level of Assistance: 6: Modified independent (Device/Increase time) Static Standing Balance Static Standing - Balance Support: During functional activity;No upper extremity supported Static Standing -  Level of Assistance: 5: Stand by assistance Dynamic Standing Balance Dynamic Standing - Balance Support: During functional activity Dynamic Standing - Level of Assistance: 5: Stand by assistance Functional Gait  Assessment Gait assessed : Yes Gait Level Surface: Walks 20 ft in less than 5.5 sec, no assistive devices, good speed, no evidence for imbalance, normal gait pattern, deviates no more than 6 in outside of the 12 in walkway width. Change in Gait Speed: Able to smoothly change walking speed without loss of balance or gait deviation. Deviate no more than 6 in outside of the 12 in walkway width. Gait with Horizontal Head Turns: Performs head turns smoothly with no change in gait. Deviates no more than 6 in outside 12 in walkway width Gait with Vertical Head Turns: Performs head turns with no change in gait. Deviates no more than 6 in outside 12 in walkway width. Gait and Pivot Turn: Pivot turns safely within 3 sec and stops quickly with no loss of balance. Step Over Obstacle: Is able to step over 2 stacked shoe boxes taped together (9 in total height) without changing gait speed. No evidence of imbalance. Gait with Narrow Base of Support: Is able to ambulate for 10 steps heel to toe with no staggering. Gait with  Eyes Closed: Walks 20 ft, no assistive devices, good speed, no evidence of imbalance, normal gait pattern, deviates no more than 6 in outside 12 in walkway width. Ambulates 20 ft in less than 7 sec. Ambulating Backwards: Walks 20 ft, no assistive devices, good speed, no evidence for imbalance, normal gait Steps: Alternating feet, no rail. Total Score: 30 Extremity Assessment  RUE Assessment RUE Assessment: Within Functional Limits Active Range of Motion (AROM) Comments: WFL General Strength Comments: Grip 110 lbs, 5/5 LUE Assessment LUE Assessment: Within Functional Limits Active Range of Motion (AROM) Comments: WFL General Strength Comments: Grip 108 lbs, 5/5 RLE Assessment RLE Assessment: Within Functional Limits General Strength Comments: dorsiflexion 4-, all other muscles 4+-5 LLE Assessment LLE Assessment: Within Functional Limits General Strength Comments: All 4+ to 5  Care Tool Care Tool Bed Mobility Roll left and right activity   Roll left and right assist level: Independent with assistive device    Sit to lying activity   Sit to lying assist level: Independent with assistive device    Lying to sitting on side of bed activity   Lying to sitting on side of bed assist level: the ability to move from lying on the back to sitting on the side of the bed with no back support.: Independent with assistive device     Care Tool Transfers Sit to stand transfer   Sit to stand assist level: Independent    Chair/bed transfer   Chair/bed transfer assist level: Psychologist, sport and exercise transfer assist level: Independent      Care Tool Locomotion Ambulation   Assist level: Supervision/Verbal cueing Assistive device: No Device Max distance: 1000  Walk 10 feet activity   Assist level: Supervision/Verbal cueing     Walk 50 feet with 2 turns activity   Assist level: Supervision/Verbal cueing    Walk 150 feet activity   Assist level: Supervision/Verbal cueing     Walk 10 feet on uneven surfaces activity   Assist level: Supervision/Verbal cueing    Stairs   Assist level: Supervision/Verbal cueing   Max number of stairs: 30  Walk up/down 1 step activity   Walk up/down 1 step (curb) assist level: Supervision/Verbal cueing  Walk up/down 4 steps activity   Walk up/down 4 steps assist level: Supervision/Verbal cueing    Walk up/down 12 steps activity   Walk up/down 12 steps assist level: Supervision/Verbal cueing    Pick up small objects from floor   Pick up small object from the floor assist level: Independent Pick up small object from the floor assistive device: ball  Wheelchair Is the patient using a wheelchair?: No          Wheel 50 feet with 2 turns activity      Wheel 150 feet activity        Refer to Care Plan for Long Term Goals  SHORT TERM GOAL WEEK 1 PT Short Term Goal 1 (Week 1): STG = LTG due to ELOS  Recommendations for other services: None   Skilled Therapeutic Intervention Mobility Bed Mobility Bed Mobility: Rolling Right;Rolling Left;Supine to Sit;Sit to Supine Rolling Right: Independent with assistive device Rolling Left: Independent with assistive device Supine to Sit: Independent with assistive device Sit to Supine: Independent with assistive device Transfers Transfers: Sit to Stand;Stand to Sit Sit to Stand: Supervision/Verbal cueing Stand to Sit: Supervision/Verbal cueing Transfer (Assistive device): None Locomotion  Gait Ambulation: Yes Gait Assistance: Supervision/Verbal cueing Gait Distance (Feet): 1000 Feet Assistive device: None Gait Gait: Yes Gait Pattern: Within Functional Limits Gait Pattern: Step-through pattern;Within Functional Limits Gait velocity: normal Stairs / Additional Locomotion Stairs: Yes Stairs Assistance: Supervision/Verbal cueing Stair Management Technique: No rails;One rail Right (did not use rails going down) Number of Stairs: 30 Ramp: Supervision/Verbal  cueing Curb: Supervision/Verbal cueing Wheelchair Mobility Wheelchair Mobility: No  Treatment Session   Patient seated in recliner on entrance to room. Patient alert and agreeable to PT session.   Patient reported that he was ready to do something because he is tired of sitting down. Pt performed the FGA and Berg today and did excellent. Pt only had issues with balancing on 1 leg. Pt asked to walk outside so we took him down outside and walked around, walking through grass, uneven surfaces, and talking multiple flights of steps. Pt performed well today, pt reports that his R dorsiflexion weakness is from a previous surgery.   Therapeutic Activity:  Transfers: Pt performed sit<>stand and stand to sit transfers throughout session without AD. No verbal cues provided.  Gait Training:  Pt ambulated +1000 ft with supervision. Pt demonstrated the following gait deviations with therapist providing the described cuing and facilitation for improvement: veering side to side when distracted.   Neuromuscular Re-ed: NMR facilitated during session with focus on dynamic gait and balance. -backwards walking -change of speed -change of direction -dual tasking  -tandem and single leg balance -eyes closed walking -walking on uneven surfaces  NMR performed for improvements in motor control and coordination, balance, sequencing, judgement, and self confidence/ efficacy in performing all aspects of mobility at highest level of independence.     Patient seated in recliner at end of session with all needs within reach.   Discharge Criteria: Patient will be discharged from PT if patient refuses treatment 3 consecutive times without medical reason, if treatment goals not met, if there is a change in medical status, if patient makes no progress towards goals or if patient is discharged from hospital.  The above assessment, treatment plan, treatment alternatives and goals were discussed and mutually agreed  upon: by patient  Mackie Sayre 07/16/2023, 2:42 PM

## 2023-07-16 NOTE — Patient Care Conference (Signed)
 Inpatient RehabilitationTeam Conference and Plan of Care Update Date: 07/16/2023   Time: 10:35 AM    Patient Name: Henry Schneider      Medical Record Number: [id number]  Date of Birth: January 16, 1956 Sex: Male         Room/Bed: 4M07C/4M07C-01 Payor Info: Payor: MEDICARE / Plan: MEDICARE PART A AND B / Product Type: *No Product type* /    Admit Date/Time:  07/15/2023  3:37 PM  Primary Diagnosis:  CVA (cerebral vascular accident) [facility])  Hospital Problems: Principal Problem:   CVA (cerebral vascular accident) [facility])    Expected Discharge Date: Expected Discharge Date: 07/19/23 (evals pending)  Team Members Present: Physician leading conference: Dr. Janeece Mechanic Social Worker Present: Adrianna Albee, LCSW Nurse Present: Forrestine Ike, RN PT Present: Javan Messing, PT OT Present: Florina Husbands, OT SLP Present: Reggie Caper, SLP     Current Status/Progress Goal Weekly Team Focus  Bowel/Bladder      Continent of bowel and bladder          Swallow/Nutrition/ Hydration   pending eval           ADL's   eval pending   eval pending   eval pending    Mobility               Communication   pending eval            Safety/Cognition/ Behavioral Observations  pending eval            Pain     N/a           Skin      N/a           Discharge Planning:  New evaluation unsure of plan at discharge acute reprots lives alone my go to brother's home in [place]. Will need supervision due to aphasia   Team Discussion: Patient post left MCA CVA with Broca's aphasia, word finding difficulties but good with 2 step commands.  Physically mobile.  Patient on target to meet rehab goals: yes, evals pending  *See Care Plan and progress notes for long and short-term goals.   Revisions to Treatment Plan:  N/a   Teaching Needs: Safety, medications, transfers, toileting, etc.   Current Barriers to Discharge: Decreased caregiver support  Possible Resolutions to  Barriers: Family education     Medical Summary Current Status: Moderate aphasia, no significant weakness in the upper or lower limbs, therapy eval's are pending  Barriers to Discharge: Other (comments)  Barriers to Discharge Comments: Aphasia, both receptive and expressive Possible Resolutions to [employer] Focus: PT OT and speech evaluations pending   Continued Need for Acute Rehabilitation Level of Care: The patient requires daily medical management by a physician with specialized training in physical medicine and rehabilitation for the following reasons: Direction of a multidisciplinary physical rehabilitation program to maximize functional independence : Yes Medical management of patient stability for increased activity during participation in an intensive rehabilitation regime.: Yes Analysis of laboratory values and/or radiology reports with any subsequent need for medication adjustment and/or medical intervention. : Yes   I attest that I was present, lead the team conference, and concur with the assessment and plan of the team.   Forrestine Ike B 07/16/2023, 1:40 PM

## 2023-07-16 NOTE — Progress Notes (Signed)
 Inpatient Rehabilitation Center Individual Statement of Services  Patient Name:  Hal Norrington  Date:  07/16/2023  Welcome to the Inpatient Rehabilitation Center.  Our goal is to provide you with an individualized program based on your diagnosis and situation, designed to meet your specific needs.  With this comprehensive rehabilitation program, you will be expected to participate in at least 3 hours of rehabilitation therapies Monday-Friday, with modified therapy programming on the weekends.  Your rehabilitation program will include the following services:  Physical Therapy (PT), Occupational Therapy (OT), Speech Therapy (ST), 24 hour per day rehabilitation nursing, Therapeutic Recreaction (TR), Care Coordinator, Rehabilitation Medicine, Nutrition Services, and Pharmacy Services  Weekly team conferences will be held on Wednesday to discuss your progress.  Your Inpatient Rehabilitation Care Coordinator will talk with you frequently to get your input and to update you on team discussions.  Team conferences with you and your family in attendance may also be held.  Expected length of stay: 4 days  Overall anticipated outcome: mod/I-supervision for speech deficits  Depending on your progress and recovery, your program may change. Your Inpatient Rehabilitation Care Coordinator will coordinate services and will keep you informed of any changes. Your Inpatient Rehabilitation Care Coordinator's name and contact numbers are listed  below.  The following services may also be recommended but are not provided by the Inpatient Rehabilitation Center:  Driving Evaluations Home Health Rehabiltiation Services Outpatient Rehabilitation Services    Arrangements will be made to provide these services after discharge if needed.  Arrangements include referral to agencies that provide these services.  Your insurance has been verified to be:  Medicare Your primary doctor is:  none  Pertinent information will be  shared with your doctor and your insurance company.  Inpatient Rehabilitation Care Coordinator:  Adrianna Albee, [place] [phone] or Justine Oms  Information discussed with and copy given to patient by: Mardell Shade, 07/16/2023, 2:48 PM

## 2023-07-16 NOTE — Progress Notes (Signed)
 Inpatient Rehabilitation  Patient information reviewed and entered into eRehab system by [facility]. Karen Kays., CCC/SLP, PPS Coordinator.  Information including medical coding, functional ability and quality indicators will be reviewed and updated through discharge.

## 2023-07-17 LAB — CBC
HCT: 44.3 % (ref 39.0–52.0)
Hemoglobin: 14.8 g/dL (ref 13.0–17.0)
MCH: 31.7 pg (ref 26.0–34.0)
MCHC: 33.4 g/dL (ref 30.0–36.0)
MCV: 94.9 fL (ref 80.0–100.0)
Platelets: 159 10*3/uL (ref 150–400)
RBC: 4.67 MIL/uL (ref 4.22–5.81)
RDW: 13.2 % (ref 11.5–15.5)
WBC: 9.2 10*3/uL (ref 4.0–10.5)
nRBC: 0 % (ref 0.0–0.2)

## 2023-07-17 LAB — BASIC METABOLIC PANEL WITH GFR
Anion gap: 3 — ABNORMAL LOW (ref 5–15)
BUN: 13 mg/dL (ref 8–23)
CO2: 25 mmol/L (ref 22–32)
Calcium: 8.8 mg/dL — ABNORMAL LOW (ref 8.9–10.3)
Chloride: 111 mmol/L (ref 98–111)
Creatinine, Ser: 1.12 mg/dL (ref 0.61–1.24)
GFR, Estimated: >60 mL/min (ref >60)
Glucose, Bld: 98 mg/dL (ref 70–99)
Potassium: 4.4 mmol/L (ref 3.5–5.1)
Sodium: 139 mmol/L (ref 135–145)

## 2023-07-17 LAB — GLUCOSE, CAPILLARY: Glucose-Capillary: 92 mg/dL (ref 70–99)

## 2023-07-17 NOTE — IPOC Note (Signed)
 Overall Plan of Care [facility]) Patient Details Name: Henry Schneider MRN: [id number] DOB: [birth year]  Admitting Diagnosis: CVA (cerebral vascular accident) [facility])  Hospital Problems: Principal Problem:   CVA (cerebral vascular accident) [facility])     Functional Problem List: Nursing Safety, Endurance, Medication Management  PT Safety, Other (comment) (high level gait, stepping with R foot clearence, and dual tasking gait)  OT Balance, Safety, Endurance, Motor  SLP Linguistic  TR         Basic ADL's: OT Dressing, Toileting, Bathing     Advanced  ADL's: OT Simple Meal Preparation, Light Housekeeping, Laundry     Transfers: PT    OT Tub/Shower, Toilet     Locomotion: PT Ambulation (dual tasking with gait)     Additional Impairments: OT    SLP Communication comprehension, expression    TR      Anticipated Outcomes Item Anticipated Outcome  Self Feeding Independent  Swallowing      Basic self-care  Mod I  Toileting  Mod I   Bathroom Transfers Mod I  Bowel/Bladder  n/a  Transfers  Independent  Locomotion  Independent  Communication  modA  Cognition     Pain  n/a  Safety/Judgment  manage safety w cues   Therapy Plan: PT Intensity: Minimum of 1-2 x/day ,45 to 90 minutes PT Frequency: 5 out of 7 days PT Duration Estimated Length of Stay: 3-5 days OT Intensity: Minimum of 1-2 x/day, 45 to 90 minutes OT Frequency: 5 out of 7 days OT Duration/Estimated Length of Stay: 3-5 days SLP Intensity: Minumum of 1-2 x/day, 30 to 90 minutes SLP Frequency: 3 to 5 out of 7 days SLP Duration/Estimated Length of Stay: 5-7 days   Team Interventions: Nursing Interventions Patient/Family Education, Medication Management, Discharge Planning, Disease Management/Prevention  PT interventions Ambulation/gait training, Therapeutic Exercise, Stair training, Warden/ranger, Neuromuscular re-education, Firefighter, Equities trader education, UE/LE Coordination  activities, UE/LE Strength taining/ROM, Therapeutic Activities, Functional mobility training, Discharge planning, Cognitive remediation/compensation, Disease management/prevention  OT Interventions Balance/vestibular training, Discharge planning, Pain management, Self Care/advanced ADL retraining, Therapeutic Activities, UE/LE Coordination activities, Cognitive remediation/compensation, Disease mangement/prevention, Functional mobility training, Patient/family education, Therapeutic Exercise, Community reintegration, Fish farm manager, Neuromuscular re-education, Psychosocial support, UE/LE Strength taining/ROM  SLP Interventions Internal/external aids, Speech/Language facilitation, Cueing hierarchy, Therapeutic Activities, Functional tasks, Multimodal communication approach, Patient/family education  TR Interventions    SW/CM Interventions Discharge Planning, Psychosocial Support, Patient/Family Education   Barriers to Discharge MD  aphasia  Nursing Decreased caregiver support, Home environment access/layout 2 level 3 ste right rail w brother Ecologist)  PT Lack of/limited family support    OT      SLP      SW       Team Discharge Planning: Destination: PT-Home ,OT- Home , SLP-Home Projected Follow-up: PT-None, OT-  None, SLP-Outpatient SLP Projected Equipment Needs: PT-None recommended by PT, OT- To be determined, SLP-  Equipment Details: PT- , OT-  Patient/family involved in discharge planning: PT- Patient,  OT-Patient, SLP-Patient  MD ELOS: 4-5 d Medical Rehab Prognosis:  Excellent Assessment: The patient has been admitted for CIR therapies with the diagnosis of CVA. The team will be addressing functional mobility, strength, stamina, balance, safety, adaptive techniques and equipment, self-care, bowel and bladder mgt, patient and caregiver education, anticoagulation management. Goals have been set at Mod I ADL and Mobilty. Anticipated discharge destination is home with  brother.        See Team Conference Notes for weekly updates to the plan of care

## 2023-07-17 NOTE — Discharge Instructions (Addendum)
 Inpatient Rehab Discharge Instructions  Henry Schneider Discharge date and time: No discharge date for patient encounter.   Activities/Precautions/ Functional Status: Activity: activity as tolerated Diet: diabetic diet Wound Care: none needed Functional status:  ___ No restrictions     ___ Walk up steps independently ___ 24/7 supervision/assistance   ___ Walk up steps with assistance ___ Intermittent supervision/assistance  ___ Bathe/dress independently ___ Walk with walker     ___ Bathe/dress with assistance ___ Walk Independently    ___ Shower independently ___ Walk with assistance    ___ Shower with assistance _X__ No alcohol     ___ Return to work/school ________  Special Instructions:  Follow-up in [facility] in 8 weeks following discharge from rehab, call office to schedule an appointment. You may also follow-up with a neurologist after your move to [place] if it is within 2-3 months after you are discharged from rehab. Establish care with PCP in 2 weeks following discharge from rehab.    My questions have been answered and I understand these instructions. I will adhere to these goals and the provided educational materials after my discharge from the hospital.  Patient/Caregiver Signature _______________________________ Date __________  Clinician Signature _______________________________________ Date __________  Please bring this form and your medication list with you to all your follow-up doctor's appointments.   COMMUNITY REFERRALS UPON DISCHARGE:      Outpatient: [facility]             Agency: [facility] IS ARRANGING VIA PCP               Medical Equipment/Items Ordered:NONE NEEDED                                                 Agency/Supplier:NA   ==================================================================================================================  Information on my medicine - ELIQUIS   (apixaban )  This medication education was reviewed with me or my healthcare representative as part of my discharge preparation.    Why was Eliquis  prescribed for you? Eliquis  was prescribed for you to reduce the risk of a blood clot forming that can cause a stroke if you have a medical condition called atrial fibrillation (a type of irregular heartbeat).  What do You need to know about Eliquis  ? Take your Eliquis  TWICE DAILY - one tablet in the morning and one tablet in the evening with or without food. If you have difficulty swallowing the tablet whole please discuss with your pharmacist how to take the medication safely.  Take Eliquis  exactly as prescribed by your doctor and DO NOT stop taking Eliquis  without talking to the doctor who prescribed the medication.  Stopping may increase your risk of developing a stroke.  Refill your prescription before you run out.  After discharge, you should have regular check-up appointments with your healthcare provider that is prescribing your Eliquis .  In the future your dose may need to be changed if your kidney function or weight changes by a significant amount or as you get older.  What do you do if you miss a dose? If you miss a dose, take it as soon as you remember on the same day and resume taking twice daily.  Do not take more than one dose of ELIQUIS  at the same time to make up a missed dose.  Important Safety Information A possible side effect of Eliquis   is bleeding. You should call your healthcare provider right away if you experience any of the following: Bleeding from an injury or your nose that does not stop. Unusual colored urine (red or dark brown) or unusual colored stools (red or black). Unusual bruising for unknown reasons. A serious fall or if you hit your head (even if there is no bleeding).  Some medicines may interact with Eliquis  and might increase your risk of bleeding or clotting while on Eliquis . To help avoid this,  consult your healthcare provider or pharmacist prior to using any new prescription or non-prescription medications, including herbals, vitamins, non-steroidal anti-inflammatory drugs (NSAIDs) and supplements.  This website has more information on Eliquis  (apixaban ): [email or web address] Henry [email or web address]

## 2023-07-17 NOTE — Progress Notes (Signed)
 PROGRESS NOTE   Subjective/Complaints: Aphasia moderate Pt doing very well physically , will need several months of OP SLP  Labs normal today   ROS- limited by above    Objective:   No results found.  Recent Labs    07/16/23 0508 07/17/23 0459  WBC 8.8 9.2  HGB 14.8 14.8  HCT 45.1 44.3  PLT 145* 159   Recent Labs    07/16/23 0508 07/17/23 0459  NA 138 139  K 4.2 4.4  CL 111 111  CO2 19* 25  GLUCOSE 112* 98  BUN 15 13  CREATININE 0.90 1.12  CALCIUM 8.7* 8.8*    Intake/Output Summary (Last 24 hours) at 07/17/2023 6295 Last data filed at 07/16/2023 1853 Gross per 24 hour  Intake 1440 ml  Output --  Net 1440 ml        Physical Exam: Vital Signs Blood pressure 115/82, pulse 76, temperature 97.9 F (36.6 C), temperature source Oral, resp. rate 18, height 6' 3 (1.905 m), weight 129 kg, SpO2 97%.   General: No acute distress Mood and affect are appropriate Heart: Regular rate and rhythm no rubs murmurs or extra sounds Lungs: Clear to auscultation, breathing unlabored, no rales or wheezes Abdomen: Positive bowel sounds, soft nontender to palpation, nondistended Extremities: No clubbing, cyanosis, or edema Skin: No evidence of breakdown, no evidence of rash Neurologic: Cranial nerves II through XII intact, motor strength is 5/5 in bilateral deltoid, bicep, tricep, grip, hip flexor, knee extensors, ankle dorsiflexor and plantar flexor- needed visual/gestural cuing due to apraxia Reduced naming, oriented to person/place, not time but this may be more due to naming issues  Able to name glasses but not stethoscope Sensory exam difficult to assess due to aphasia   Musculoskeletal: Full range of motion in all 4 extremities except Right ankle mildly diminished DF/PF. No joint swelling   Assessment/Plan: 1. Functional deficits which require 3+ hours per day of interdisciplinary therapy in a comprehensive  inpatient rehab setting. Physiatrist is providing close team supervision and 24 hour management of active medical problems listed below. Physiatrist and rehab team continue to assess barriers to discharge/monitor patient progress toward functional and medical goals  Care Tool:  Bathing    Body parts bathed by patient: Right arm, Left arm, Chest, Abdomen, Front perineal area, Buttocks, Right upper leg, Right lower leg, Left upper leg, Left lower leg, Face         Bathing assist Assist Level: Supervision/Verbal cueing     Upper Body Dressing/Undressing Upper body dressing   What is the patient wearing?: Pull over shirt    Upper body assist Assist Level: Independent with assistive device    Lower Body Dressing/Undressing Lower body dressing      What is the patient wearing?: Pants, Underwear/pull up     Lower body assist Assist for lower body dressing: Supervision/Verbal cueing     Toileting Toileting    Toileting assist Assist for toileting: Supervision/Verbal cueing     Transfers Chair/bed transfer  Transfers assist     Chair/bed transfer assist level: Independent     Locomotion Ambulation   Ambulation assist      Assist level: Supervision/Verbal cueing Assistive  device: No Device Max distance: 1000   Walk 10 feet activity   Assist     Assist level: Supervision/Verbal cueing     Walk 50 feet activity   Assist    Assist level: Supervision/Verbal cueing      Walk 150 feet activity   Assist    Assist level: Supervision/Verbal cueing      Walk 10 feet on uneven surface  activity   Assist     Assist level: Supervision/Verbal cueing     Wheelchair     Assist Is the patient using a wheelchair?: No             Wheelchair 50 feet with 2 turns activity    Assist            Wheelchair 150 feet activity     Assist          Blood pressure 115/82, pulse 76, temperature 97.9 F (36.6 C), temperature  source Oral, resp. rate 18, height 6' 3 (1.905 m), weight 129 kg, SpO2 97%.  Medical Problem List and Plan: 1. 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  Antithrombotics: -DVT/anticoagulation:  Pharmaceutical: Eliquis              -antiplatelet therapy: N/A  3. Pain Management: Tylenol  prn  Hx of Right leg (? Tibial ) fx and chronic Right ankle stiffness  4. Mood/Behavior/Sleep: LCSW to follow for evaluation and support when available.             -antipsychotic agents: N/A 5. Neuropsych/cognition: This patient is capable of making decisions on his own behalf. 6. Skin/Wound Care: Routine skin checks-monitor R groin incision 7. Fluids/Electrolytes/Nutrition: Monitor I/O  check CMET in a.m.             -Diet Regular changed to Carb modified                8. Hyperlipidemia: LDL 44 Atorvastatin resumed today    9. Hypertension: stable- home Valsartan not resumed  Vitals:   07/16/23 1950 07/17/23 0510  BP: 127/80 115/82  Pulse: 86 76  Resp: 18 18  Temp: 98 F (36.7 C) 97.9 F (36.6 C)  SpO2: 99% 97%                 10. T2DM: controlled A1c 6.0.?? resume metformin              - 6/13 glucose 112              -will check a few CBG's. If readings are reasonable then dc checks             -he may not need metformin if he's compliant with CM diet CBG (last 3)  Recent Labs    07/16/23 0620 07/17/23 0624  GLUCAP 115* 92    11. Leukocytosis: WBC 13.9 6/13 monitor repeat CBC in a.m.    Latest Ref Rng & Units 07/17/2023    4:59 AM 07/16/2023    5:08 AM 07/11/2023   10:40 PM  CBC  WBC 4.0 - 10.5 K/uL 9.2  8.8  13.9   Hemoglobin 13.0 - 17.0 g/dL 16.1  09.6  04.5   Hematocrit 39.0 -  52.0 % 44.3  45.1  49.9   Platelets 150 - 400 K/uL 159  145  189     12. Tobacco: Discuss importance of smoking  cessation and risk factors.  13. Constipation:             -no reported BM since admit              -he appears to be eating             -senna-s 2 tabs at bedtime             LOS: 2 days A FACE TO FACE EVALUATION WAS PERFORMED  [name] 07/17/2023, 6:37 AM

## 2023-07-17 NOTE — Progress Notes (Signed)
 Speech Language Pathology Daily Session Note  Patient Details  Name: Henry Schneider MRN: [id number] Date of Birth: 06-20-1955  Today's Date: 07/17/2023 SLP Individual Time: 4098-1191 and 1359-1454 SLP Individual Time Calculation (min): 47 min And 55 min  Short Term Goals: Week 1: SLP Short Term Goal 1 (Week 1): STG = LTG due to elos  Skilled Therapeutic Interventions:  Session 1: SLP conducted skilled therapy session targeting communication goals. Target sentence formulation with inclusion of all imperative grammatical components. Patient benefited from supervision to name objects but mod to max assist to form complete sentences using each word. Patient benefited most from provision of written model/outline for sentence formation. SLP then targeted reading and apraxic errors. Patient read through rainbow passage with apraxic errors noted on most words over 3 syllables in length. Patient benefited from word isolation and syllable breakdown alongside max assist to accurately sequence speech sounds in these complex words. Patient was left in room with call bell in reach and alarm set. SLP will continue to target goals per plan of care.   Session 2: SLP conducted skilled therapy session targeting communication goals. Upon entry, patient discussed activities individually completed including writing all states out in a list. Observed frequent spelling errors, which patient was aware of but unable to independently correct. SLP started with facilitating reading task with patient tasked with producing minimal pairs in sentences. Benefited from mod assist for awareness of mistakes. Similiarly benefited from mod assist to produce minimal pairs varying by word stress. Patient completed mildly complex language comprehension task with mod cues to slow rate and be thorough. At end of session, SLP assisted patient in rewriting US  states with correct spelling, with patient benefiting from max assist to redirect  perseverations of incorrect spellings. Patient was left in room with call bell in reach and alarm set. SLP will continue to target goals per plan of care.       Pain  None endorsed  Therapy/Group: Individual Therapy  Emanuel Campos, M.A., CCC-SLP  Gredmarie Delange A Coulter Oldaker 07/17/2023, 8:29 AM

## 2023-07-17 NOTE — Plan of Care (Signed)
  Problem: Consults Goal: RH STROKE PATIENT EDUCATION Description: See Patient Education module for education specifics  Outcome: Progressing   Problem: RH SAFETY Goal: RH STG ADHERE TO SAFETY PRECAUTIONS W/ASSISTANCE/DEVICE Description: STG Adhere to Safety Precautions With cues Assistance/Device. Outcome: Progressing   Problem: RH KNOWLEDGE DEFICIT Goal: RH STG INCREASE KNOWLEDGE OF DIABETES Description: Patient and brother will be able to manage DM using educational resources for medications and dietary modification recommendations independently Outcome: Progressing Goal: RH STG INCREASE KNOWLEDGE OF HYPERTENSION Description: Patient and brother will be able to manage HTN using educational resources for medications and dietary modification recommendations independently Outcome: Progressing Goal: RH STG INCREASE KNOWLEGDE OF HYPERLIPIDEMIA Description: Patient and brother will be able to manage HLD using educational resources for medications and dietary modification recommendations independently Outcome: Progressing Goal: RH STG INCREASE KNOWLEDGE OF STROKE PROPHYLAXIS Description: Patient and brother will be able to manage secondary risk using educational resources for medications and dietary modification recommendations independently Outcome: Progressing

## 2023-07-17 NOTE — Progress Notes (Signed)
 Physical Therapy Session Note  Patient Details  Name: Henry Schneider MRN: [id number] Date of Birth: 04-02-1955  Today's Date: 07/17/2023 PT Individual Time: 1500-1530 PT Individual Time Calculation (min): 30 min   Short Term Goals: Week 1:  PT Short Term Goal 1 (Week 1): STG = LTG due to ELOS  Skilled Therapeutic Interventions/Progress Updates:      Pt sitting in recliner to start - agreeable to therapy treatment. He denies any pain. Sit<>stand with distant supervision without UE support or AD. Ambulates with distant supervision and no AD through rehab hallways > 522ft with no LOB or knee buckling - able to dual task with conversation (aphasic) without change in gait speed or LOB. Pt instructed in Wii Bowling which was a novel task for him - needing mod instructional cues to start, fading to min cues by the end of the 10th frame. Pt did not need a seated rest break the whole session. Returned patient to his room and he was left up in recliner, needs met.   Therapy Documentation Precautions:  Precautions Precautions: Fall Precaution/Restrictions Comments: aphasia Restrictions Weight Bearing Restrictions Per Provider Order: No General:    Therapy/Group: Individual Therapy  Pheobe Brass 07/17/2023, 3:31 PM

## 2023-07-17 NOTE — Progress Notes (Signed)
 Patient ID: Henry Schneider, male   DOB: 1955/05/30, 68 y.o.   MRN: [id number] Spoke with brother who is still trying to figure out coming down here to get him. Pt can not fly due to has no real ID, so brother will have to come and get him via car. Pt seems to feels can go home by himself and no services are needed. Which is wrong, informed does need follow up speech therapy along with medical management. Brother reports trying to set up meeting with pt and PCP office in [place] so can get services needed. Have asked if speech can do during their session today or tomorrow Awaiting response from speech.

## 2023-07-17 NOTE — Progress Notes (Signed)
 Occupational Therapy Session Note  Patient Details  Name: Asahel Risden MRN: [id number] Date of Birth: September 13, 1955  Today's Date: 07/17/2023 OT Individual Time: 7829-5621 OT Individual Time Calculation (min): 54 min   Short Term Goals: Week 1:  OT Short Term Goal 1 (Week 1): STG=LTG d/t ELOS  Skilled Therapeutic Interventions/Progress Updates:     Pt received sitting up in chair, dressed and ready for the day with all ADL needs met. Pt presenting to be in good spirits receptive to skilled OT session reporting 0/10 pain- OT offering intermittent rest breaks, repositioning, and therapeutic support to optimize participation in therapy session. Focused this session on Pt education, dynamic balance, and IADL retraining. Pt completed functional mobility throughout session no AD with distant SUP. In ADL apartment, provided education on CVA etiology/recovery process, fall prevention, energy conservation techniques, and simple home modifications to increase Pt's safety. Engaged Pt in completing simulated cooking task preparing familiar meal with Pt able to retrieve all necessary cooking items/utensils, independently sequence through all steps of task, and demonstrate appropriate safety awareness. Pt then completed simulated cleaning activities putting away dishes into high/low cabinets, sweeping, and vacuuming the floor without LOB, seated rest break provided following. To simulate grocery shopping, positioned fruits/vegies (15 items) around ADL apartment and tasked Pt with retrieving each item while carrying a basket. Incorporated naming of objects into activity with Pt able to correctly name 8/15 fruits/veggies with min questioning cues. He was able to recognize the written names of all objects, however demonstrated most challenges with pronouncing P vs B and M vs N. Pt utilizing taught strategies of writing the word out and then stating it independently throughout session. Pt ambulated back to room at  end of session no AD, distant supervision. Re-educated Pt on purpose of IPR stay and call don't fall policy with Pt receptive to education. Pt was left resting in recliner with call bell in reach and all needs met.    Therapy Documentation Precautions:  Precautions Precautions: Fall Precaution/Restrictions Comments: aphasia Restrictions Weight Bearing Restrictions Per Provider Order: No  Therapy/Group: Individual Therapy  Geoffery Kiel 07/17/2023, 8:04 AM

## 2023-07-18 ENCOUNTER — Other Ambulatory Visit (HOSPITAL_COMMUNITY): Payer: Self-pay

## 2023-07-18 LAB — GLUCOSE, CAPILLARY: Glucose-Capillary: 106 mg/dL — ABNORMAL HIGH (ref 70–99)

## 2023-07-18 MED ORDER — SPIRONOLACTONE 25 MG PO TABS
12.5000 mg | ORAL_TABLET | Freq: Every day | ORAL | 0 refills | Status: AC
  Filled 2023-07-18: qty 30, 60d supply, fill #0

## 2023-07-18 MED ORDER — FUROSEMIDE 20 MG PO TABS
20.0000 mg | ORAL_TABLET | Freq: Every day | ORAL | 0 refills | Status: AC
  Filled 2023-07-18: qty 30, 30d supply, fill #0

## 2023-07-18 MED ORDER — DAPAGLIFLOZIN PROPANEDIOL 10 MG PO TABS
10.0000 mg | ORAL_TABLET | Freq: Every day | ORAL | 0 refills | Status: AC
  Filled 2023-07-18: qty 30, 30d supply, fill #0

## 2023-07-18 MED ORDER — ATORVASTATIN CALCIUM 40 MG PO TABS
40.0000 mg | ORAL_TABLET | Freq: Every day | ORAL | 0 refills | Status: DC
  Filled 2023-07-18: qty 30, 30d supply, fill #0

## 2023-07-18 MED ORDER — APIXABAN 5 MG PO TABS
5.0000 mg | ORAL_TABLET | Freq: Two times a day (BID) | ORAL | 0 refills | Status: AC
  Filled 2023-07-18: qty 60, 30d supply, fill #0

## 2023-07-18 MED ORDER — SENNOSIDES-DOCUSATE SODIUM 8.6-50 MG PO TABS
2.0000 | ORAL_TABLET | Freq: Every day | ORAL | 0 refills | Status: DC
  Filled 2023-07-18: qty 30, 15d supply, fill #0

## 2023-07-18 MED ORDER — METOPROLOL SUCCINATE ER 50 MG PO TB24
50.0000 mg | ORAL_TABLET | ORAL | 0 refills | Status: AC
  Filled 2023-07-18: qty 30, 30d supply, fill #0

## 2023-07-18 NOTE — Progress Notes (Signed)
 Physical Therapy Session Note  Patient Details  Name: Henry Schneider MRN: [id number] Date of Birth: 1955/05/07  Today's Date: 07/18/2023 PT Individual Time: 1135-1200 PT Individual Time Calculation (min): 25 min   Short Term Goals: Week 1:  PT Short Term Goal 1 (Week 1): STG = LTG due to ELOS  Skilled Therapeutic Interventions/Progress Updates:  Patient seated on loveseat on entrance to room. Patient alert and agreeable to PT session. Hops up quickly, grabs gait belt, dons with IND, and presents self as ready to go.  Patient with no pain complaint at start of session.  Therapeutic Activity/ Gait Training: Patient able to ambulate almost 0.5 miles during session over uneven paved surfaces around hospital campus. Maintained steady pace and was able to navigate busy hallways, atrium, and safely cross busy intersections with traffic that di and did not stop for pedestrians in walkway. He safely navigated steps with no use of handrails and was able to return demonstrate reciprocal bounding over 20 feet with good balance and control throughout.  Patient in room at end of session with brakes locked, no alarm set as pt is independent in room, and all needs within reach.   Therapy Documentation Precautions:  Precautions Precautions: Fall Precaution/Restrictions Comments: aphasia Restrictions Weight Bearing Restrictions Per Provider Order: No  Pain: Pain Assessment Pain Scale: 0-10 Pain Score: 0-No pain related this session.    Therapy/Group: Individual Therapy  Donne Gage PT, DPT, CSRS 07/18/2023, 12:05 PM

## 2023-07-18 NOTE — Progress Notes (Signed)
 Inpatient Rehabilitation Discharge Medication Review by a Pharmacist  A complete drug regimen review was completed for this patient to identify any potential clinically significant medication issues.  High Risk Drug Classes Is patient taking? Indication by Medication  Antipsychotic No   Anticoagulant Yes Apixaban  for hx PE and possible afib   Antibiotic No   Opioid No   Antiplatelet No   Hypoglycemics/insulin No   Vasoactive Medication Yes Metoprolol for HTN and HF  Chemotherapy No   Other Yes Atorvastatin for hyperlipidemia Dapagliflozin, furosemide, spironolactone for HFimpEF Senna for bowel care     Type of Medication Issue Identified Description of Issue Recommendation(s)  Drug Interaction(s) (clinically significant)     Duplicate Therapy     Allergy     No Medication Administration End Date     Incorrect Dose     Additional Drug Therapy Needed     Significant med changes from prior encounter (inform family/care partners about these prior to discharge). STOP metformin and valsartan Team to review with patient   Other       Clinically significant medication issues were identified that warrant physician communication and completion of prescribed/recommended actions by midnight of the next day:  No  Name of provider notified for urgent issues identified:   Provider Method of Notification:     Pharmacist comments:   Time spent performing this drug regimen review (minutes):  15  Dorene Gang, PharmD, [place], [facility] Clinical Pharmacist  Please check AMION for all [facility] Pharmacy phone numbers After 10:00 PM, call Main Pharmacy [phone]

## 2023-07-18 NOTE — Progress Notes (Signed)
 Occupational Therapy Session Note  Patient Details  Name: Henry Schneider MRN: [id number] Date of Birth: 1955-04-13  Today's Date: 07/18/2023 OT Individual Time: 7829-5621 OT Individual Time Calculation (min): 69 min    Short Term Goals: Week 1:  OT Short Term Goal 1 (Week 1): STG=LTG d/t ELOS  Skilled Therapeutic Interventions/Progress Updates:     Pt received sitting EOB, discussing d/c plans with MD upon OT arrival. Pt presenting to be in good spirits receptive to skilled OT session reporting 0/10 pain- OT offering intermittent rest breaks, repositioning, and therapeutic support to optimize participation in therapy session. Pt requesting to complete his morning routine this AM. Engaged Pt in retrieving towels and clothing items from closet and shelves to simulate setting up for shower in his home environment with Pt able to complete mod I. Pt then completed 3/3 toileting tasks on standard toilet no AD mod I following continent BM and void- documented in flowsheets. U/LB bathing completed in standing position in shower mod I with increased time provided for energy conservation. U/LB dressing completed while seated in chair no AD mod I. Following shower, Pt able to retrieve went towels from the floor and transport them to laundry bag mod I simulating laundry tasks in home environment. Pt with appropriate pacing and safety awareness noted during activities. Grooming/hygiene tasks of oral care, grooming hair, and applying deodorant completed standing at sink with no AD mod I. Pt then participated in simulated community outing navigating around hospital indoor/outdoor environments without AD. Pt able to utilize signs in hospital to navigate to specified locations without difficult, min questioning cues provided to locate signs, but otherwise Pt able to follow all directions. When navigating in outdoor environment, Pt demonstrating appropriate navigation of obstacles and sufficient balance to ambulate  across uneven terrain without LOB while engaging in conversation with OT for increased dual tasking challenge. Pt made mod I in his room at end of session with nursing staff informed and in agreement with plan. Pt was left resting in chair with call bell in reach and all needs met.    Therapy Documentation Precautions:  Precautions Precautions: Fall Precaution/Restrictions Comments: aphasia Restrictions Weight Bearing Restrictions Per Provider Order: No   Therapy/Group: Individual Therapy  Geoffery Kiel 07/18/2023, 7:53 AM

## 2023-07-18 NOTE — Progress Notes (Signed)
 Speech Language Pathology Daily Session Note  Patient Details  Name: Henry Schneider MRN: [id number] Date of Birth: 04/07/1955  Today's Date: 07/18/2023 SLP Individual Time: 4098-1191 and 1359-1454 SLP Individual Time Calculation (min): 39 min And 55 min  Short Term Goals: Week 1: SLP Short Term Goal 1 (Week 1): STG = LTG due to elos  Skilled Therapeutic Interventions:  Session 1: SLP conducted skilled therapy session targeting communication goals. Facilitated reading task with patient reading at the sentence level with sentences containing words of varying complexities. Patient exhibits apraxic errors and phonemic paraphasias benefiting from mod cues for awareness of errors and to break perseverations on sound errors. Patient exhibiting improvement in frequency of self-correction compared to previous sessions. Patient then completed task targeting word emphasis differentiation benefiting from supervision to accurately place emphasis where indicated. Patient was left in room with call bell in reach and alarm set. SLP will continue to target goals per plan of care.    Session 2: SLP conducted skilled therapy session targeting communication goals. Patient requested to take a walk around the grounds, walked without physical assistance. During walk, participated in conversation re: favored topics (I.e. favorite book genres, etc.) with mod assist for word finding and sentence formation. Upon return to room, patient sorted objects by category quickly with modI. SLP targeted verb formation given difficulty with sentence structure. Given action picture cards, patient required mod assist to form verb and to utilize verb in a full sentence. Patient benefited most from writing words to break perseverative phonemic errors. Patient was left in room with call bell in reach and alarm set. SLP will continue to target goals per plan of care.        Pain Pain Assessment Pain Scale: 0-10 Pain Score: 0-No  pain  Therapy/Group: Individual Therapy  Blayton Huttner, M.A., CCC-SLP  Kristine Chahal A Jennilee Demarco 07/18/2023, 10:56 AM

## 2023-07-18 NOTE — Discharge Summary (Incomplete)
 Physician Discharge Summary  Patient ID: Henry Schneider MRN: [id number] DOB/AGE: 02/22/1955 68 y.o.  Admit date: 07/15/2023 Discharge date: 07/18/2023  Discharge Diagnoses:  Principal Problem:   CVA (cerebral vascular accident) [facility]) Aphasia   Discharged Condition: stable  Significant Diagnostic Studies: MR BRAIN WO CONTRAST Result Date: 07/14/2023 CLINICAL DATA:  Stroke, follow up EXAM: MRI HEAD WITHOUT CONTRAST TECHNIQUE: Multiplanar, multiecho pulse sequences of the brain and surrounding structures were obtained without intravenous contrast. COMPARISON:  CT head June 14 25. FINDINGS: Brain: Acute left MCA territory infarcts including acute infarcts in left basal ganglia, left insula, overlying left frontal, parietal and temporal lobes. Associated edema with trace (1-2 mm) rightward midline shift. No evidence of acute hemorrhage, mass lesion, or hydrocephalus. Patchy T2/FLAIR hyperintensities in the white matter, compatible with chronic microvascular disease. Vascular: Major arterial flow voids are maintained at the skull base. Skull and upper cervical spine: Normal marrow signal. Sinuses/Orbits: Mild paranasal sinus mucosal thickening. No acute orbital findings. IMPRESSION: Acute left MCA territory infarcts with trace (1-2 mm) rightward midline shift. Electronically Signed   By: Stevenson Elbe M.D.   On: 07/14/2023 19:21   ECHOCARDIOGRAM COMPLETE Result Date: 07/12/2023    ECHOCARDIOGRAM REPORT   Patient Name:   Henry Schneider Date of Exam: 07/12/2023 Medical Rec #:  [id number]      Height:       75.0 in Accession #:    [id number]     Weight:       281.5 lb Date of Birth:  [birth year]      BSA:          2.538 m Patient Age:    68 years       BP:           95/84 mmHg Patient Gender: M              HR:           63 bpm. Exam Location:  Inpatient Procedure: 2D Echo, Cardiac Doppler and Color Doppler (Both Spectral and Color            Flow Doppler were utilized during procedure). Indications:     Stroke I63.9  History:        Patient has prior history of Echocardiogram examinations, most                 recent 12/19/2014. CHF, Stroke; Risk Factors:Current Smoker.  Sonographer:    Kip Peon RDCS Referring Phys: [id number] ASHISH ARORA IMPRESSIONS  1. Left ventricular ejection fraction, by estimation, is 25 to 30%. The left ventricle has severely decreased function. The left ventricle demonstrates regional wall motion abnormalities (see scoring diagram/findings for description). There is mild left  ventricular hypertrophy. Left ventricular diastolic parameters are consistent with Grade I diastolic dysfunction (impaired relaxation).  2. Right ventricular systolic function is normal. The right ventricular size is mildly enlarged. There is normal pulmonary artery systolic pressure. The estimated right ventricular systolic pressure is 26.8 mmHg.  3. Left atrial size was mildly dilated.  4. Right atrial size was mildly dilated.  5. The mitral valve is normal in structure. Trivial mitral valve regurgitation. No evidence of mitral stenosis.  6. The aortic valve is tricuspid. Aortic valve regurgitation is not visualized. No aortic stenosis is present.  7. Aortic dilatation noted. There is dilatation of the ascending aorta, measuring 40 mm.  8. The inferior vena cava is normal in size with greater than 50% respiratory variability, suggesting right atrial pressure of 3 mmHg.  Conclusion(s)/Recommendation(s): Given presentation with CVA and severe systolic dysfunction with apical akinesis, recommend repeat limited echo with contrast to evaluate for LV thrombus. FINDINGS  Left Ventricle: Left ventricular ejection fraction, by estimation, is 25 to 30%. The left ventricle has severely decreased function. The left ventricle demonstrates regional wall motion abnormalities. The left ventricular internal cavity size was normal  in size. There is mild left ventricular hypertrophy. Left ventricular diastolic parameters are  consistent with Grade I diastolic dysfunction (impaired relaxation).  LV Wall Scoring: The entire lateral wall, entire inferior wall, apical anterior segment, and apex are akinetic. The anterior wall and entire septum are normal. Right Ventricle: The right ventricular size is mildly enlarged. No increase in right ventricular wall thickness. Right ventricular systolic function is normal. There is normal pulmonary artery systolic pressure. The tricuspid regurgitant velocity is 2.44  m/s, and with an assumed right atrial pressure of 3 mmHg, the estimated right ventricular systolic pressure is 26.8 mmHg. Left Atrium: Left atrial size was mildly dilated. Right Atrium: Right atrial size was mildly dilated. Pericardium: There is no evidence of pericardial effusion. Mitral Valve: The mitral valve is normal in structure. Trivial mitral valve regurgitation. No evidence of mitral valve stenosis. Tricuspid Valve: The tricuspid valve is normal in structure. Tricuspid valve regurgitation is trivial. Aortic Valve: The aortic valve is tricuspid. Aortic valve regurgitation is not visualized. No aortic stenosis is present. Pulmonic Valve: The pulmonic valve was not well visualized. Pulmonic valve regurgitation is not visualized. Aorta: The aortic root is normal in size and structure and aortic dilatation noted. There is dilatation of the ascending aorta, measuring 40 mm. Venous: The inferior vena cava is normal in size with greater than 50% respiratory variability, suggesting right atrial pressure of 3 mmHg. IAS/Shunts: The interatrial septum was not well visualized.  LEFT VENTRICLE PLAX 2D LVIDd:         5.50 cm   Diastology LVIDs:         5.00 cm   LV e' medial:    4.03 cm/s LV PW:         1.30 cm   LV E/e' medial:  10.8 LV IVS:        1.20 cm   LV e' lateral:   5.12 cm/s LVOT diam:     2.10 cm   LV E/e' lateral: 8.5 LV SV:         42 LV SV Index:   17 LVOT Area:     3.46 cm  RIGHT VENTRICLE             IVC RV Basal diam:  4.60 cm      IVC diam: 2.00 cm RV S prime:     19.30 cm/s TAPSE (M-mode): 3.1 cm LEFT ATRIUM              Index        RIGHT ATRIUM           Index LA diam:        3.80 cm  1.50 cm/m   RA Area:     26.20 cm LA Vol (A2C):   69.5 ml  27.39 ml/m  RA Volume:   80.50 ml  31.72 ml/m LA Vol (A4C):   104.0 ml 40.98 ml/m LA Biplane Vol: 89.7 ml  35.35 ml/m  AORTIC VALVE LVOT Vmax:   65.70 cm/s LVOT Vmean:  41.700 cm/s LVOT VTI:    0.121 m  AORTA Ao Root diam: 3.90 cm Ao Asc diam:  4.00 cm MITRAL VALVE               TRICUSPID VALVE MV Area (PHT): 2.56 cm    TR Peak grad:   23.8 mmHg MV Decel Time: 296 msec    TR Vmax:        244.00 cm/s MV E velocity: 43.50 cm/s MV A velocity: 78.10 cm/s  SHUNTS MV E/A ratio:  0.56        Systemic VTI:  0.12 m                            Systemic Diam: 2.10 cm Carson Clara MD Electronically signed by Carson Clara MD Signature Date/Time: 07/12/2023/1:15:57 PM    Final    IR PERCUTANEOUS ART THROMBECTOMY/INFUSION INTRACRANIAL INC DIAG ANGIO Result Date: 07/12/2023 PROCEDURE PERFORMED: 1. Stroke thrombectomy 2. Ultrasound vascular access 3. Cone beam CT for treatment planning COMPARISON:  CT perfusion performed July 11, 2023 CLINICAL DATA:  68 year old male with history of acute ischemic stroke with symptoms aphasia. NIH stroke scale was measured at 5, however, symptoms were disabling. Patient was anticoagulated and not a candidate for thrombo lytic therapy. Stroke imaging was notable for a large vessel occlusion with left M2/M3 occlusion. Patient was reviewed in a multidisciplinary setting and considered a suitable candidate for stroke thrombectomy. INDICATION: Acute ischemic stroke. ANESTHESIA/SEDATION: General anesthesia was utilized for the procedure. CONTRAST:  Approximately 100 cc Omnipaque  300 MEDICATIONS: Heparin  3000 units intravenously FLUOROSCOPY TIME:  Fluoroscopy Time: 19 minutes (2113 mGy). COMPLICATIONS: None immediate. BODY OF REPORT: Following a full explanation  of the procedure along with the potential associated complications, an informed witnessed consent was obtained. The patient was then placed under general anesthesia by the Department of Anesthesiology at [facility]. The right groin was prepped and draped in the usual sterile fashion. Ultrasound was used to study the right superficial femoral artery which was patent. Using real-time ultrasound guidance, a 21 gauge introducer needle was used to access the right common femoral artery. Access was performed at 0054. A hard copy image ultrasound the same date uncertain PACS. Using this access, a 6 French sheath was placed in the descending thoracic aorta. Approximately 3000 units of heparin  was administered intravenously. Next, selective catheterization the left internal carotid artery was performed. A selective arteriogram was performed which demonstrated that the internal carotid artery was patent. In occlusion of an inferior M3 division artery was observed with no significant perfusion to the posterior parietal lobe through this vessel. The remaining MCA branches were patent. Next, a penumbra 43 and red 62 catheter was used to selectively catheterize the main trunk of the left M2 division. A selective arteriogram confirmed the M3 branch occlusion. Next, a wire was advanced into the M3 branch occlusion in this was followed with advancement of the penumbra 43 catheter. Suction thrombectomy was performed with first pass at 01:23. The first treatment demonstrated improvement in perfusion to this territory with residual wall adherent thrombus at a bifurcation point as well as along the wall of the target vessel. A second pass was performed at 01:40 with modest improvement. A third pass was performed at 01:59 using a zoom 35 catheter. A small volume of thrombus material was removed and I elected to terminate the procedure after this pass. Partial recanalization was achieved with a TICI score 2b. After reviewing the  imaging I elected to terminate the procedure at this point. Evaluation of the right femoral access site demonstrated  at this site was suitable for closure device. A 6 French Angio-Seal device was deployed without complication. Cone beam CT was then performed to evaluate for intracranial hemorrhage and treatment planning. This demonstrated no evidence significant acute intracranial hemorrhage. The patient was removed from anesthesia and transferred to recovery in stable condition. IMPRESSION: 1. Suction thrombectomy of left M3 branch occlusion which provide significant perfusion to the posterior left parietal lobe. 2. Post treatment TICI score 2b. PLAN: 1. To ICU for routine postoperative supportive care. 2. A follow-up CT is planned for early morning. If there is no evidence of hemorrhage, given suspected cardioembolic etiology and residual nonocclusive wall adherent thrombus, consideration may be given toward initiation of a neuro dose heparin  infusion. Electronically Signed   By: Reagan Camera M.D.   On: 07/12/2023 09:23   IR CT Head Ltd Result Date: 07/12/2023 PROCEDURE PERFORMED: 1. Stroke thrombectomy 2. Ultrasound vascular access 3. Cone beam CT for treatment planning COMPARISON:  CT perfusion performed July 11, 2023 CLINICAL DATA:  68 year old male with history of acute ischemic stroke with symptoms aphasia. NIH stroke scale was measured at 5, however, symptoms were disabling. Patient was anticoagulated and not a candidate for thrombo lytic therapy. Stroke imaging was notable for a large vessel occlusion with left M2/M3 occlusion. Patient was reviewed in a multidisciplinary setting and considered a suitable candidate for stroke thrombectomy. INDICATION: Acute ischemic stroke. ANESTHESIA/SEDATION: General anesthesia was utilized for the procedure. CONTRAST:  Approximately 100 cc Omnipaque  300 MEDICATIONS: Heparin  3000 units intravenously FLUOROSCOPY TIME:  Fluoroscopy Time: 19 minutes (2113 mGy).  COMPLICATIONS: None immediate. BODY OF REPORT: Following a full explanation of the procedure along with the potential associated complications, an informed witnessed consent was obtained. The patient was then placed under general anesthesia by the Department of Anesthesiology at [facility]. The right groin was prepped and draped in the usual sterile fashion. Ultrasound was used to study the right superficial femoral artery which was patent. Using real-time ultrasound guidance, a 21 gauge introducer needle was used to access the right common femoral artery. Access was performed at 0054. A hard copy image ultrasound the same date uncertain PACS. Using this access, a 6 French sheath was placed in the descending thoracic aorta. Approximately 3000 units of heparin  was administered intravenously. Next, selective catheterization the left internal carotid artery was performed. A selective arteriogram was performed which demonstrated that the internal carotid artery was patent. In occlusion of an inferior M3 division artery was observed with no significant perfusion to the posterior parietal lobe through this vessel. The remaining MCA branches were patent. Next, a penumbra 43 and red 62 catheter was used to selectively catheterize the main trunk of the left M2 division. A selective arteriogram confirmed the M3 branch occlusion. Next, a wire was advanced into the M3 branch occlusion in this was followed with advancement of the penumbra 43 catheter. Suction thrombectomy was performed with first pass at 01:23. The first treatment demonstrated improvement in perfusion to this territory with residual wall adherent thrombus at a bifurcation point as well as along the wall of the target vessel. A second pass was performed at 01:40 with modest improvement. A third pass was performed at 01:59 using a zoom 35 catheter. A small volume of thrombus material was removed and I elected to terminate the procedure after this pass.  Partial recanalization was achieved with a TICI score 2b. After reviewing the imaging I elected to terminate the procedure at this point. Evaluation of the right femoral access  site demonstrated at this site was suitable for closure device. A 6 French Angio-Seal device was deployed without complication. Cone beam CT was then performed to evaluate for intracranial hemorrhage and treatment planning. This demonstrated no evidence significant acute intracranial hemorrhage. The patient was removed from anesthesia and transferred to recovery in stable condition. IMPRESSION: 1. Suction thrombectomy of left M3 branch occlusion which provide significant perfusion to the posterior left parietal lobe. 2. Post treatment TICI score 2b. PLAN: 1. To ICU for routine postoperative supportive care. 2. A follow-up CT is planned for early morning. If there is no evidence of hemorrhage, given suspected cardioembolic etiology and residual nonocclusive wall adherent thrombus, consideration may be given toward initiation of a neuro dose heparin  infusion. Electronically Signed   By: Reagan Camera M.D.   On: 07/12/2023 09:23   IR US  Guide Vasc Access Right Result Date: 07/12/2023 PROCEDURE PERFORMED: 1. Stroke thrombectomy 2. Ultrasound vascular access 3. Cone beam CT for treatment planning COMPARISON:  CT perfusion performed July 11, 2023 CLINICAL DATA:  68 year old male with history of acute ischemic stroke with symptoms aphasia. NIH stroke scale was measured at 5, however, symptoms were disabling. Patient was anticoagulated and not a candidate for thrombo lytic therapy. Stroke imaging was notable for a large vessel occlusion with left M2/M3 occlusion. Patient was reviewed in a multidisciplinary setting and considered a suitable candidate for stroke thrombectomy. INDICATION: Acute ischemic stroke. ANESTHESIA/SEDATION: General anesthesia was utilized for the procedure. CONTRAST:  Approximately 100 cc Omnipaque  300 MEDICATIONS: Heparin   3000 units intravenously FLUOROSCOPY TIME:  Fluoroscopy Time: 19 minutes (2113 mGy). COMPLICATIONS: None immediate. BODY OF REPORT: Following a full explanation of the procedure along with the potential associated complications, an informed witnessed consent was obtained. The patient was then placed under general anesthesia by the Department of Anesthesiology at [facility]. The right groin was prepped and draped in the usual sterile fashion. Ultrasound was used to study the right superficial femoral artery which was patent. Using real-time ultrasound guidance, a 21 gauge introducer needle was used to access the right common femoral artery. Access was performed at 0054. A hard copy image ultrasound the same date uncertain PACS. Using this access, a 6 French sheath was placed in the descending thoracic aorta. Approximately 3000 units of heparin  was administered intravenously. Next, selective catheterization the left internal carotid artery was performed. A selective arteriogram was performed which demonstrated that the internal carotid artery was patent. In occlusion of an inferior M3 division artery was observed with no significant perfusion to the posterior parietal lobe through this vessel. The remaining MCA branches were patent. Next, a penumbra 43 and red 62 catheter was used to selectively catheterize the main trunk of the left M2 division. A selective arteriogram confirmed the M3 branch occlusion. Next, a wire was advanced into the M3 branch occlusion in this was followed with advancement of the penumbra 43 catheter. Suction thrombectomy was performed with first pass at 01:23. The first treatment demonstrated improvement in perfusion to this territory with residual wall adherent thrombus at a bifurcation point as well as along the wall of the target vessel. A second pass was performed at 01:40 with modest improvement. A third pass was performed at 01:59 using a zoom 35 catheter. A small volume of  thrombus material was removed and I elected to terminate the procedure after this pass. Partial recanalization was achieved with a TICI score 2b. After reviewing the imaging I elected to terminate the procedure at this point. Evaluation of  the right femoral access site demonstrated at this site was suitable for closure device. A 6 French Angio-Seal device was deployed without complication. Cone beam CT was then performed to evaluate for intracranial hemorrhage and treatment planning. This demonstrated no evidence significant acute intracranial hemorrhage. The patient was removed from anesthesia and transferred to recovery in stable condition. IMPRESSION: 1. Suction thrombectomy of left M3 branch occlusion which provide significant perfusion to the posterior left parietal lobe. 2. Post treatment TICI score 2b. PLAN: 1. To ICU for routine postoperative supportive care. 2. A follow-up CT is planned for early morning. If there is no evidence of hemorrhage, given suspected cardioembolic etiology and residual nonocclusive wall adherent thrombus, consideration may be given toward initiation of a neuro dose heparin  infusion. Electronically Signed   By: Reagan Camera M.D.   On: 07/12/2023 09:23   DG Chest 1 View Result Date: 07/12/2023 CLINICAL DATA:  Aspiration into airway. EXAM: CHEST  1 VIEW COMPARISON:  12/19/2014 FINDINGS: Moderate cardiomegaly again seen. AICD with leads overlying the right heart. Moderate elevation of left hemidiaphragm noted. Both lungs are clear. IMPRESSION: Moderate cardiomegaly. No active lung disease. Elevated left hemidiaphragm. Electronically Signed   By: Marlyce Sine M.D.   On: 07/12/2023 08:59   CT HEAD POST STROKE FOLLOWUP/TIMED/STAT READ Result Date: 07/12/2023 EXAM: CT HEAD WITHOUT CONTRAST 07/12/2023 07:53:59 AM TECHNIQUE: CT of the head was performed without the administration of intravenous contrast. Automated exposure control, iterative reconstruction, and/or weight based  adjustment of the mA/kV was utilized to reduce the radiation dose to as low as reasonably achievable. COMPARISON: CT head without contrast and CT angio head and neck 07/11/2023. CLINICAL HISTORY: Neuro deficit, acute, stroke suspected. FINDINGS: BRAIN AND VENTRICLES: The nonhemorrhagic left temporal and parietal infarct demonstrates expected evolution. No acute hemorrhage is present. No significant expansion of the infarct is present. Mass effect is present with effacement of the sulci. 1 to 2 mm midline shift is present. Cavum sptum pellucidum is present. ORBITS: The visualized portion of the orbits demonstrate no acute abnormality. SINUSES: Mild mucosal thickening is present in the right greater than left maxillary sinus. The paranasal sinuses and mastoid air cells are otherwise clear. SOFT TISSUES AND SKULL: No acute abnormality of the visualized skull or soft tissues. IMPRESSION: 1. Nonhemorrhagic left temporal and parietal infarct with expected evolution. 2. Developing mass effect, effacement of the sulci, and 1 to 2 mm midline shift is present. 3. No acute hemorrhage or significant expansion of the infarct. Electronically signed by: Audree Leas MD 07/12/2023 08:02 AM EDT RP Workstation: [id number]   CT HEAD CODE STROKE WO CONTRAST` Result Date: 07/11/2023 CLINICAL DATA:  Initial EXAM: CT ANGIOGRAPHY HEAD AND NECK CT PERFUSION BRAIN TECHNIQUE: Multidetector CT imaging of the head and neck was performed using the standard protocol during bolus administration of intravenous contrast. Multiplanar CT image reconstructions and MIPs were obtained to evaluate the vascular anatomy. Carotid stenosis measurements (when applicable) are obtained utilizing NASCET criteria, using the distal internal carotid diameter as the denominator. Multiphase CT imaging of the brain was performed following IV bolus contrast injection. Subsequent parametric perfusion maps were calculated using RAPID software. RADIATION DOSE  REDUCTION: This exam was performed according to the departmental dose-optimization program which includes automated exposure control, adjustment of the mA and/or kV according to patient size and/or use of iterative reconstruction technique. CONTRAST:  OMNIPAQUE  IOHEXOL  350 MG/ML SOLN COMPARISON:  None available. FINDINGS: CT HEAD FINDINGS Brain: Evolving cytotoxic edema involving the left insula and posterior left  cerebral hemisphere, consistent with an evolving left MCA distribution infarct. Mild patchy involvement of the anterior left frontal lobe as well. Sparing of the basal ganglia. No significant mass effect or midline shift. No hydrocephalus. No acute intracranial hemorrhage. No extra-axial fluid collection. Vascular: Abnormal hyperdensity involving proximal left MCA branch, into and/or M3 segment, concerning for thrombus (series 4, image 13). Skull: Scalp soft tissues within normal limits.  Calvarium intact. Sinuses/Orbits: Globes orbital soft tissues within normal limits. Chronic mucosal thickening present about the right sphenoid sinus. Paranasal sinuses are otherwise clear. No mastoid effusion. Other: None. ASPECTS ([place] Stroke Program Early CT Score) - Ganglionic level infarction (caudate, lentiform nuclei, internal capsule, insula, M1-M3 cortex): 6 - Supraganglionic infarction (M4-M6 cortex): 1 Total score (0-10 with 10 being normal): 7 Review of the MIP images confirms the above findings CTA NECK FINDINGS Aortic arch: Visualized aortic arch within normal limits for caliber standard branch pattern. Mild aortic atherosclerosis. No significant stenosis about the origin of the great vessels. Right carotid system: Right common and internal carotid arteries are patent without stenosis or dissection. Mild for age atheromatous change about the right carotid bulb without stenosis. Left carotid system: Left common and internal carotid arteries are patent without stenosis or dissection. Vertebral  arteries: Both vertebral arteries arise from subclavian arteries. Vertebral arteries patent without significant stenosis or dissection. Skeleton: No worrisome osseous lesions. Other neck: No other acute finding. 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 Emphysema noted. Coronary artery calcifications noted as well. Review of the MIP images confirms the above findings CTA HEAD FINDINGS Anterior circulation: Mild atheromatous change about the carotid siphons without hemodynamically significant stenosis. A1 segments patent bilaterally. Normal anterior communicating artery complex. Anterior cerebral arteries are patent without stenosis. No M1 stenosis or occlusion. Right MCA branches widely patent and well perfused. On the left, there is an acute proximal M3 stump occlusion, inferior division (series 9, image 137). Diminished arborization within the left MCA territory. Posterior circulation: Both V4 segments patent without significant stenosis. Neither PICA origin well visualized. Basilar patent without stenosis. Superior cerebellar and posterior cerebral arteries patent bilaterally. Venous sinuses: Bowel assessed due to timing the contrast bolus. Anatomic variants: None significant.  No aneurysm. Review of the MIP images confirms the above findings CT Brain Perfusion Findings: ASPECTS: 7 CBF (<30%) Volume: 20mL Perfusion (Tmax>6.0s) volume: 94mL Mismatch Volume: 74mL Infarction Location:Acute core infarct involving the posterior left frontoparietal region, posterior left MCA territory. 74 mL surrounding ischemic penumbra. IMPRESSION: CT HEAD: 1. Evolving cytotoxic edema involving the left insula and overlying left cerebral hemisphere, consistent with an evolving acute left MCA distribution infarct. No acute intracranial hemorrhage. 2. Aspects = 7. 3. Abnormal hyperdensity involving a proximal left MCA branch, concerning for thrombus. CTA HEAD AND NECK: 1. Acute proximal M3 stump occlusion, inferior division left  MCA. 2. Otherwise negative CTA for large vessel occlusion or other emergent finding. 3. Mild for age atheromatous change about the carotid bifurcations and carotid siphons without hemodynamically significant stenosis. 4. Aortic Atherosclerosis (ICD10-I70.0) and Emphysema (ICD10-J43.9). CT PERFUSION: 20 mL acute core infarct involving the posterior left frontoparietal region, posterior left MCA territory. 74 mL surrounding ischemic penumbra. Critical Value/emergent results were called by telephone at the time of interpretation on 07/11/2023 at 11:01 pm to provider NEHA RAY , who verbally acknowledged these results. Electronically Signed   By: Virgia Griffins M.D.   On: 07/11/2023 23:17   CT ANGIO HEAD NECK W WO CM W PERF (CODE STROKE) Result Date: 07/11/2023 CLINICAL DATA:  Initial EXAM: CT ANGIOGRAPHY HEAD AND NECK CT PERFUSION BRAIN TECHNIQUE: Multidetector CT imaging of the head and neck was performed using the standard protocol during bolus administration of intravenous contrast. Multiplanar CT image reconstructions and MIPs were obtained to evaluate the vascular anatomy. Carotid stenosis measurements (when applicable) are obtained utilizing NASCET criteria, using the distal internal carotid diameter as the denominator. Multiphase CT imaging of the brain was performed following IV bolus contrast injection. Subsequent parametric perfusion maps were calculated using RAPID software. RADIATION DOSE REDUCTION: This exam was performed according to the departmental dose-optimization program which includes automated exposure control, adjustment of the mA and/or kV according to patient size and/or use of iterative reconstruction technique. CONTRAST:  OMNIPAQUE  IOHEXOL  350 MG/ML SOLN COMPARISON:  None available. FINDINGS: CT HEAD FINDINGS Brain: Evolving cytotoxic edema involving the left insula and posterior left cerebral hemisphere, consistent with an evolving left MCA distribution infarct. Mild patchy  involvement of the anterior left frontal lobe as well. Sparing of the basal ganglia. No significant mass effect or midline shift. No hydrocephalus. No acute intracranial hemorrhage. No extra-axial fluid collection. Vascular: Abnormal hyperdensity involving proximal left MCA branch, into and/or M3 segment, concerning for thrombus (series 4, image 13). Skull: Scalp soft tissues within normal limits.  Calvarium intact. Sinuses/Orbits: Globes orbital soft tissues within normal limits. Chronic mucosal thickening present about the right sphenoid sinus. Paranasal sinuses are otherwise clear. No mastoid effusion. Other: None. ASPECTS (Alberta Stroke Program Early CT Score) - Ganglionic level infarction (caudate, lentiform nuclei, internal capsule, insula, M1-M3 cortex): 6 - Supraganglionic infarction (M4-M6 cortex): 1 Total score (0-10 with 10 being normal): 7 Review of the MIP images confirms the above findings CTA NECK FINDINGS Aortic arch: Visualized aortic arch within normal limits for caliber standard branch pattern. Mild aortic atherosclerosis. No significant stenosis about the origin of the great vessels. Right carotid system: Right common and internal carotid arteries are patent without stenosis or dissection. Mild for age atheromatous change about the right carotid bulb without stenosis. Left carotid system: Left common and internal carotid arteries are patent without stenosis or dissection. Vertebral arteries: Both vertebral arteries arise from subclavian arteries. Vertebral arteries patent without significant stenosis or dissection. Skeleton: No worrisome osseous lesions. Other neck: No other acute finding. 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 Emphysema noted. Coronary artery calcifications noted as well. Review of the MIP images confirms the above findings CTA HEAD FINDINGS Anterior circulation: Mild atheromatous change about the carotid siphons without hemodynamically significant stenosis. A1  segments patent bilaterally. Normal anterior communicating artery complex. Anterior cerebral arteries are patent without stenosis. No M1 stenosis or occlusion. Right MCA branches widely patent and well perfused. On the left, there is an acute proximal M3 stump occlusion, inferior division (series 9, image 137). Diminished arborization within the left MCA territory. Posterior circulation: Both V4 segments patent without significant stenosis. Neither PICA origin well visualized. Basilar patent without stenosis. Superior cerebellar and posterior cerebral arteries patent bilaterally. Venous sinuses: Bowel assessed due to timing the contrast bolus. Anatomic variants: None significant.  No aneurysm. Review of the MIP images confirms the above findings CT Brain Perfusion Findings: ASPECTS: 7 CBF (<30%) Volume: 20mL Perfusion (Tmax>6.0s) volume: 94mL Mismatch Volume: 74mL Infarction Location:Acute core infarct involving the posterior left frontoparietal region, posterior left MCA territory. 74 mL surrounding ischemic penumbra. IMPRESSION: CT HEAD: 1. Evolving cytotoxic edema involving the left insula and overlying left cerebral hemisphere, consistent with an evolving acute left MCA distribution infarct. No acute intracranial hemorrhage. 2. Aspects =  7. 3. Abnormal hyperdensity involving a proximal left MCA branch, concerning for thrombus. CTA HEAD AND NECK: 1. Acute proximal M3 stump occlusion, inferior division left MCA. 2. Otherwise negative CTA for large vessel occlusion or other emergent finding. 3. Mild for age atheromatous change about the carotid bifurcations and carotid siphons without hemodynamically significant stenosis. 4. Aortic Atherosclerosis (ICD10-I70.0) and Emphysema (ICD10-J43.9). CT PERFUSION: 20 mL acute core infarct involving the posterior left frontoparietal region, posterior left MCA territory. 74 mL surrounding ischemic penumbra. Critical Value/emergent results were called by telephone at the time of  interpretation on 07/11/2023 at 11:01 pm to provider NEHA RAY , who verbally acknowledged these results. Electronically Signed   By: Virgia Griffins M.D.   On: 07/11/2023 23:17    Labs:  Basic Metabolic Panel: Recent Labs  Lab 07/11/23 2240 07/16/23 0508 07/17/23 0459  NA 138 138 139  K 4.3 4.2 4.4  CL 110 111 111  CO2 21* 19* 25  GLUCOSE 106* 112* 98  BUN 27* 15 13  CREATININE 1.13 0.90 1.12  CALCIUM 9.0 8.7* 8.8*    CBC: Recent Labs  Lab 07/11/23 2240 07/16/23 0508 07/17/23 0459  WBC 13.9* 8.8 9.2  NEUTROABS 9.2* 5.2  --   HGB 16.5 14.8 14.8  HCT 49.9 45.1 44.3  MCV 95.2 94.7 94.9  PLT 189 145* 159    CBG: Recent Labs  Lab 07/11/23 2234 07/16/23 0620 07/17/23 0624 07/18/23 0538  GLUCAP 112* 115* 92 106*    Brief HPI:   Henry Schneider is a 68 y.o. male ***  Therapy evaluations completed due to patient decreased functional mobility ***  and was admitted for a comprehensive rehab program.  Hospital Course: Henry Schneider was admitted to rehab 07/15/2023 for inpatient therapies to consist of PT, ST and OT at least three hours five days a week. Past admission physiatrist, therapy team and rehab RN have worked together to provide customized collaborative inpatient rehab.   Blood pressures were monitored on TID basis and   Diabetes has been monitored with ac/hs CBG checks and SSI was use prn for tighter BS control. Diabetic teaching was completed.   Rehab course: During patient's stay in rehab weekly team conferences were held to monitor patient's progress, set goals and discuss barriers to discharge. At admission, patient required  He/She  has had improvement in activity tolerance, balance, postural control as well as ability to compensate for deficits. He/She has had improvement in functional use RUE/LUE  and RLE/LLE as well as improvement in awareness  Family teaching completed upon discharge to home.      Disposition: Discharge disposition: 01-Home or  Self Care        Diet:  Special Instructions: No drinking alcohol and No Driving.  2. 3.   Allergies as of 07/18/2023   No Known Allergies      Medication List     STOP taking these medications    metFORMIN 500 MG tablet Commonly known as: GLUCOPHAGE   valsartan 40 MG tablet Commonly known as: DIOVAN       TAKE these medications    apixaban  5 MG Tabs tablet Commonly known as: ELIQUIS  Take 1 tablet (5 mg total) by mouth 2 (two) times daily.   atorvastatin 40 MG tablet Commonly known as: LIPITOR Take 1 tablet (40 mg total) by mouth daily.   dapagliflozin propanediol 10 MG Tabs tablet Commonly known as: FARXIGA Take 1 tablet (10 mg total) by mouth daily.   furosemide 20 MG tablet Commonly  known as: LASIX Take 1 tablet (20 mg total) by mouth daily.   metoprolol succinate 50 MG 24 hr tablet Commonly known as: TOPROL-XL Take 1-2 tablets (50-100 mg total) by mouth See admin instructions. Take 2 tablets (100mg ) by mouth in the morning and 1 tablet (50mg ) in the afternoon.   senna-docusate 8.6-50 MG tablet Commonly known as: Senokot-S Take 2 tablets by mouth at bedtime.   spironolactone 25 MG tablet Commonly known as: ALDACTONE Take 0.5 tablets (12.5 mg total) by mouth daily.        Follow-up Information     [facility] Follow up.   Specialty: Neurology Why: Call for an appointment Contact information: [address] [place]   [id number] [phone]                Signed: Nasario Badder 07/18/2023, 9:15 AM

## 2023-07-18 NOTE — Progress Notes (Shared)
 Occupational Therapy Discharge Summary  Patient Details  Name: Henry Schneider MRN: [id number] Date of Birth: Jun 01, 1955  Date of Discharge from OT service:July 18, 2023  {CHL IP REHAB OT TIME CALCULATIONS:[id number]}   Patient has met 9 of 9 long term goals due to improved activity tolerance, improved balance, ability to compensate for deficits, and improved awareness.  Patient to discharge at overall Modified Independent level.  Patient's care partner is independent to provide the necessary cognitive assistance at discharge.    Reasons goals not met: All goals met  Recommendation:  No further OT services recommended at this time as Pt's is at his baseline for ADLs and IADLs.   Equipment: No equipment provided  Reasons for discharge: treatment goals met and discharge from hospital  Patient/family agrees with progress made and goals achieved: Yes  OT Discharge Precautions/Restrictions  Precautions Precaution/Restrictions Comments: aphasia Restrictions Weight Bearing Restrictions Per Provider Order: No Pain Pain Assessment Pain Scale: 0-10 Pain Score: 0-No pain ADL ADL Eating: Independent Where Assessed-Eating: Chair Grooming: Independent Where Assessed-Grooming: Standing at sink Upper Body Bathing: Independent Where Assessed-Upper Body Bathing: Shower Lower Body Bathing: Independent Where Assessed-Lower Body Bathing: Shower Upper Body Dressing: Independent Where Assessed-Upper Body Dressing: Chair Lower Body Dressing: Independent Where Assessed-Lower Body Dressing: Chair Toileting: Independent Where Assessed-Toileting: Teacher, adult education: Community education officer Method: Proofreader: Engineer, technical sales: Designer, industrial/product Method: Event organiser: Psychologist, counselling Method: Designer, industrial/product: Engineer, materials Vision Baseline Vision/History: 1 Wears  glasses Patient Visual Report: No change from baseline Vision Assessment?: No apparent visual deficits Perception  Perception: Within Functional Limits Praxis Praxis: WFL Cognition Cognition Overall Cognitive Status: Difficult to assess (2/2 to expressive aphasia) Arousal/Alertness: Awake/alert Orientation Level: Person;Place;Situation Person: Oriented Place: Oriented Situation: Oriented Memory: Appears intact Attention: Sustained Sustained Attention: Appears intact Awareness: Appears intact Problem Solving: Appears intact Executive Function:  (mild decreased insight into deficits (i.e., why he needs supervision at d/c)) Safety/Judgment: Appears intact Brief Interview for Mental Status (BIMS) Repetition of Three Words (First Attempt): 3 (pt able to write out date and recall words independently) Temporal Orientation: Year: Correct Temporal Orientation: Month: Accurate within 5 days Temporal Orientation: Day: Correct Recall: Sock: Yes, no cue required Recall: Blue: Yes, no cue required Recall: Bed: Yes, no cue required BIMS Summary Score: 15 Sensation Sensation Light Touch: Appears Intact Coordination Gross Motor Movements are Fluid and Coordinated: Yes Fine Motor Movements are Fluid and Coordinated: Yes Motor  Motor Motor: Within Functional Limits Mobility  Bed Mobility Bed Mobility: Rolling Right;Rolling Left;Supine to Sit;Sit to Supine Rolling Left: Independent Supine to Sit: Independent Sit to Supine: Independent Transfers Sit to Stand: Independent Stand to Sit: Independent  Trunk/Postural Assessment  Cervical Assessment Cervical Assessment: Within Functional Limits Thoracic Assessment Thoracic Assessment: Within Functional Limits Lumbar Assessment Lumbar Assessment: Within Functional Limits Postural Control Postural Control: Within Functional Limits  Balance Balance Balance Assessed: Yes Static Sitting Balance Static Sitting - Balance Support:  Feet supported Static Sitting - Level of Assistance: 7: Independent Dynamic Sitting Balance Dynamic Sitting - Balance Support: Feet supported;During functional activity Dynamic Sitting - Level of Assistance: 7: Independent Static Standing Balance Static Standing - Balance Support: During functional activity Static Standing - Level of Assistance: 7: Independent Dynamic Standing Balance Dynamic Standing - Balance Support: During functional activity Dynamic Standing - Level of Assistance: 7: Independent Extremity/Trunk Assessment RUE Assessment RUE Assessment: Within Functional Limits Active Range of Motion (AROM) Comments: WFL General Strength Comments: 5/5 proximally  and distally LUE Assessment LUE Assessment: Within Functional Limits Active Range of Motion (AROM) Comments: [facility] General Strength Comments: 5/5 proximally and distally   Geoffery Kiel 07/18/2023, 7:53 AM

## 2023-07-18 NOTE — Progress Notes (Addendum)
 PROGRESS NOTE   Subjective/Complaints:  Remains aphasic, brother will be arriving from [place] on Sunday Plan is to go to his place for a night , gather things and drive to [place], set up therapies in that area ROS- limited by above    Objective:   No results found.  Recent Labs    07/16/23 0508 07/17/23 0459  WBC 8.8 9.2  HGB 14.8 14.8  HCT 45.1 44.3  PLT 145* 159   Recent Labs    07/16/23 0508 07/17/23 0459  NA 138 139  K 4.2 4.4  CL 111 111  CO2 19* 25  GLUCOSE 112* 98  BUN 15 13  CREATININE 0.90 1.12  CALCIUM 8.7* 8.8*    Intake/Output Summary (Last 24 hours) at 07/18/2023 0730 Last data filed at 07/17/2023 2020 Gross per 24 hour  Intake 850 ml  Output --  Net 850 ml        Physical Exam: Vital Signs Blood pressure 104/73, pulse 72, temperature 97.6 F (36.4 C), temperature source Oral, resp. rate 18, height 6' 3 (1.905 m), weight 129 kg, SpO2 97%.   General: No acute distress Mood and affect are appropriate Heart: Regular rate and rhythm no rubs murmurs or extra sounds Lungs: Clear to auscultation, breathing unlabored, no rales or wheezes Abdomen: Positive bowel sounds, soft nontender to palpation, nondistended Extremities: No clubbing, cyanosis, or edema Skin: No evidence of breakdown, no evidence of rash Neurologic: Cranial nerves II through XII intact, motor strength is 5/5 in bilateral deltoid, bicep, tricep, grip, hip flexor, knee extensors, ankle dorsiflexor and plantar flexor- needed visual/gestural cuing due to apraxia Reduced naming, oriented to person/place, not time but this may be more due to naming issues  Able to name glasses but not stethoscope Sensory exam difficult to assess due to aphasia   Musculoskeletal: Full range of motion in all 4 extremities except Right ankle mildly diminished DF/PF. No joint swelling   Assessment/Plan: 1. Functional deficits which require 3+  hours per day of interdisciplinary therapy in a comprehensive inpatient rehab setting. Physiatrist is providing close team supervision and 24 hour management of active medical problems listed below. Physiatrist and rehab team continue to assess barriers to discharge/monitor patient progress toward functional and medical goals  Care Tool:  Bathing    Body parts bathed by patient: Right arm, Left arm, Chest, Abdomen, Front perineal area, Buttocks, Right upper leg, Right lower leg, Left upper leg, Left lower leg, Face         Bathing assist Assist Level: Supervision/Verbal cueing     Upper Body Dressing/Undressing Upper body dressing   What is the patient wearing?: Pull over shirt    Upper body assist Assist Level: Independent with assistive device    Lower Body Dressing/Undressing Lower body dressing      What is the patient wearing?: Pants, Underwear/pull up     Lower body assist Assist for lower body dressing: Supervision/Verbal cueing     Toileting Toileting Toileting Activity did not occur (Clothing management and hygiene only): N/A (no void or bm)  Toileting assist Assist for toileting: Supervision/Verbal cueing     Transfers Chair/bed transfer  Transfers assist  Chair/bed transfer assist level: Independent     Locomotion Ambulation   Ambulation assist      Assist level: Supervision/Verbal cueing Assistive device: No Device Max distance: 1000   Walk 10 feet activity   Assist     Assist level: Supervision/Verbal cueing     Walk 50 feet activity   Assist    Assist level: Supervision/Verbal cueing      Walk 150 feet activity   Assist    Assist level: Supervision/Verbal cueing      Walk 10 feet on uneven surface  activity   Assist     Assist level: Supervision/Verbal cueing     Wheelchair     Assist Is the patient using a wheelchair?: No             Wheelchair 50 feet with 2 turns activity    Assist             Wheelchair 150 feet activity     Assist          Blood pressure 104/73, pulse 72, temperature 97.6 F (36.4 C), temperature source Oral, resp. rate 18, height 6' 3 (1.905 m), weight 129 kg, SpO2 97%.  Medical Problem List and Plan: 1. 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  Antithrombotics: -DVT/anticoagulation:  Pharmaceutical: Eliquis              -antiplatelet therapy: N/A  3. Pain Management: Tylenol  prn  Hx of Right leg (? Tibial ) fx and chronic Right ankle stiffness  4. Mood/Behavior/Sleep: LCSW to follow for evaluation and support when available.             -antipsychotic agents: N/A 5. Neuropsych/cognition: This patient is capable of making decisions on his own behalf. 6. Skin/Wound Care: Routine skin checks-monitor R groin incision 7. Fluids/Electrolytes/Nutrition: Monitor I/O  check CMET in a.m.             -Diet Regular changed to Carb modified                8. Hyperlipidemia: LDL 44 Atorvastatin resumed today    9. Hypertension: stable- home Valsartan not resumed  Vitals:   07/17/23 2017 07/18/23 0444  BP:  104/73  Pulse: 84 72  Resp:  18  Temp:  97.6 F (36.4 C)  SpO2: 98% 97%                 10. T2DM: controlled A1c 6.0.?? resume metformin              - 6/13 glucose 112              -will check a few CBG's. If readings are reasonable then dc checks             -he may not need metformin if he's compliant with CM diet CBG (last 3)  Recent Labs    07/16/23 0620 07/17/23 0624 07/18/23 0538  GLUCAP 115* 92 106*   Controlled off meds  11. Leukocytosis: resolved    Latest Ref Rng & Units 07/17/2023    4:59 AM 07/16/2023    5:08 AM 07/11/2023  10:40 PM  CBC  WBC 4.0 - 10.5 K/uL 9.2  8.8   13.9   Hemoglobin 13.0 - 17.0 g/dL 96.2  95.2  84.1   Hematocrit 39.0 - 52.0 % 44.3  45.1  49.9   Platelets 150 - 400 K/uL 159  145  189     12. Tobacco: Discuss importance of smoking cessation and risk factors.  13. Constipation:             -no reported BM since admit              -he appears to be eating             -senna-s 2 tabs at bedtime             LOS: 3 days A FACE TO FACE EVALUATION WAS PERFORMED  [name] 07/18/2023, 7:30 AM

## 2023-07-18 NOTE — Progress Notes (Signed)
 Patient ID: Henry Schneider, male   DOB: 07/11/1955, 68 y.o.   MRN: [id number]  Spoke with Tim-brother who reports they can not get here until Sunday to transport pt to [place]. Will give clinicals to pt for his brother and have let team and MD/PA aware of this plan.

## 2023-07-18 NOTE — Progress Notes (Signed)
 Inpatient Rehabilitation Care Coordinator Discharge Note DC SUNDAY 6/22  Patient Details  Name: Henry Schneider MRN: [id number] Date of Birth: 11-Mar-1955   Discharge location: HOME WITH BROTHER WHO CAME FROM [place] TO TAKE BACK HOME WITH HIM  Length of Stay: 4 DAYS  Discharge activity level: SUPERVISION  Home/community participation: ACTIVE  Patient response WU:[id number] Literacy - How often do you need to have someone help you when you read instructions, pamphlets, or other written material from your doctor or pharmacy?: Never  Patient response YN:WGNFAO Isolation - How often do you feel lonely or isolated from those around you?: Rarely  Services provided included: MD, RD, PT, OT, SLP, RN, CM, Pharmacy, SW  Financial Services:  Financial Services Utilized: Medicare    Choices offered to/list presented to: PT  Follow-up services arranged:  Outpatient    Outpatient Servicies: BROTHER TO SET UP OPSPT ONCE IN [place] VIA NEW PCP    NO DME NEEDS  BROTHER SETTING UP PCP AND OP-SP ONCE GETS UP TO [place]  Patient response to transportation need: Is the patient able to respond to transportation needs?: Yes In the past 12 months, has lack of transportation kept you from medical appointments or from getting medications?: No In the past 12 months, has lack of transportation kept you from meetings, work, or from getting things needed for daily living?: No   Patient/Family verbalized understanding of follow-up arrangements:  Yes  Individual responsible for coordination of the follow-up plan: SELF AND TIM-BROTHER [phone]  Confirmed correct DME delivered: Mardell Shade 07/18/2023    Comments (or additional information): PT WAS HIGH LEVEL WHEN HE CAME TO REHAB. ONLY NEED IS OP-SPT DUE TO APHASIA  Summary of Stay    Date/Time Discharge Planning CSW  07/16/23 0845 New evaluation unsure of plan at discharge acute reprots lives alone my go to brother's home in [place]. Will need  supervision due to aphasia RGD       Gypsy Kellogg, Maralee Senate

## 2023-07-18 NOTE — Progress Notes (Shared)
 Speech Language Pathology Discharge Summary  Patient Details  Name: Henry Schneider MRN: [id number] Date of Birth: 1956-01-13  Date of Discharge from SLP service:July 18, 2023  Patient has met 3 of 3 long term goals.  Patient to discharge at overall Mod level.  Reasons goals not met:   n/a  Clinical Impression/Discharge Summary:   Patient has made steady gains throughout rehabilitation stay and has met 3/3 long term goals set for duration of admission. Patient currently benefits from mod assist to communicate abstract thoughts and supervision-min assist to comprehend complex auditory information. Patient would benefit from continued SLP services to target lingering communication deficits. Patient and family education complete. SLP will sign off.   Care Partner:  Caregiver Able to Provide Assistance: Yes  Type of Caregiver Assistance:  (Communicative)  Recommendation:  Outpatient SLP  Rationale for SLP Follow Up: Maximize functional communication   Equipment: n/a   Reasons for discharge: Discharged from hospital   Patient/Family Agrees with Progress Made and Goals Achieved: Yes   [name], M.A., CCC-SLP  [name] 07/18/2023, 12:22 PM

## 2023-07-19 ENCOUNTER — Other Ambulatory Visit (HOSPITAL_COMMUNITY): Payer: Self-pay

## 2023-07-19 DIAGNOSIS — R739 Hyperglycemia, unspecified: Secondary | ICD-10-CM

## 2023-07-19 LAB — GLUCOSE, CAPILLARY: Glucose-Capillary: 116 mg/dL — ABNORMAL HIGH (ref 70–99)

## 2023-07-19 NOTE — Plan of Care (Signed)
  Problem: RH Bathing Goal: LTG Patient will bathe all body parts with assist levels (OT) Description: LTG: Patient will bathe all body parts with assist levels (OT) Outcome: Completed/Met   Problem: RH Dressing Goal: LTG Patient will perform lower body dressing w/assist (OT) Description: LTG: Patient will perform lower body dressing with assist, with/without cues in positioning using equipment (OT) Outcome: Completed/Met   Problem: RH Toileting Goal: LTG Patient will perform toileting task (3/3 steps) with assistance level (OT) Description: LTG: Patient will perform toileting task (3/3 steps) with assistance level (OT)  Outcome: Completed/Met   Problem: RH Simple Meal Prep Goal: LTG Patient will perform simple meal prep w/assist (OT) Description: LTG: Patient will perform simple meal prep with assistance, with/without cues (OT). Outcome: Completed/Met   Problem: RH Laundry Goal: LTG Patient will perform laundry w/assist, cues (OT) Description: LTG: Patient will perform laundry with assistance, with/without cues (OT). Outcome: Completed/Met   Problem: RH Light Housekeeping Goal: LTG Patient will perform light housekeeping w/assist (OT) Description: LTG: Patient will perform light housekeeping with assistance, with/without cues (OT). Outcome: Completed/Met   Problem: RH Toilet Transfers Goal: LTG Patient will perform toilet transfers w/assist (OT) Description: LTG: Patient will perform toilet transfers with assist, with/without cues using equipment (OT) Outcome: Completed/Met   Problem: RH Tub/Shower Transfers Goal: LTG Patient will perform tub/shower transfers w/assist (OT) Description: LTG: Patient will perform tub/shower transfers with assist, with/without cues using equipment (OT) Outcome: Completed/Met   Problem: RH Awareness Goal: LTG: Patient will demonstrate awareness during functional activites type of (OT) Description: LTG: Patient will demonstrate awareness during  functional activites type of (OT) Outcome: Completed/Met

## 2023-07-19 NOTE — Progress Notes (Signed)
 Occupational Therapy Discharge Summary  Patient Details  Name: Henry Schneider MRN: [id number] Date of Birth: 03/22/1955  Date of Discharge from OT service:July 19, 2023  Patient has met 9 of 9 long term goals due to improved activity tolerance, improved balance, postural control, ability to compensate for deficits, improved attention, improved awareness, and improved coordination.  Patient to discharge at overall Independent level.  Patient's care partner is independent to provide the necessary physical assistance at discharge for higher level iADL tasks.    Reasons goals not met: n/a  Recommendation:  Patient will benefit from ongoing skilled OT services in outpatient setting to continue to advance functional skills in the area of BADL.  Equipment: No equipment provided  Reasons for discharge: treatment goals met and discharge from hospital  Patient/family agrees with progress made and goals achieved: Yes  OT Discharge Precautions/Restrictions  Precautions Precautions: None Precaution/Restrictions Comments: aphasia Restrictions Weight Bearing Restrictions Per Provider Order: No Pain  Denies pain ADL ADL Eating: Independent Where Assessed-Eating: Chair Grooming: Independent Where Assessed-Grooming: Standing at sink Upper Body Bathing: Independent Where Assessed-Upper Body Bathing: Shower Lower Body Bathing: Independent Where Assessed-Lower Body Bathing: Shower Upper Body Dressing: Independent Where Assessed-Upper Body Dressing: Chair Lower Body Dressing: Independent Where Assessed-Lower Body Dressing: Chair Toileting: Independent Where Assessed-Toileting: Teacher, adult education: Community education officer Method: Proofreader: Engineer, technical sales: Designer, industrial/product Method: Event organiser: Psychologist, counselling Method: Designer, industrial/product: Information systems manager: Within Systems developer Praxis Praxis: WFL Cognition Cognition Overall Cognitive Status: Within Functional Limits for tasks assessed Arousal/Alertness: Awake/alert Orientation Level: Person;Place;Situation Person: Oriented Place: Oriented Situation: Oriented Memory: Appears intact Attention: Sustained Sustained Attention: Appears intact Awareness: Appears intact Awareness Impairment: Emergent impairment Problem Solving: Appears intact Executive Function:  (mild decreased insight into deficits (i.e., why he needs supervision at d/c)) Safety/Judgment: Appears intact Comments: mild safety awareness deficits Brief Interview for Mental Status (BIMS) Repetition of Three Words (First Attempt): 3 Temporal Orientation: Year: Correct Temporal Orientation: Month: Accurate within 5 days Temporal Orientation: Day: Correct Recall: Sock: Yes, no cue required Recall: Blue: Yes, no cue required Recall: Bed: Yes, no cue required BIMS Summary Score: 15 Sensation Sensation Light Touch: Appears Intact Coordination Gross Motor Movements are Fluid and Coordinated: Yes Fine Motor Movements are Fluid and Coordinated: Yes Motor  Motor Motor: Within Functional Limits Mobility  Bed Mobility Bed Mobility: Rolling Right;Rolling Left;Supine to Sit;Sit to Supine Rolling Right: Independent with assistive device Rolling Left: Independent Supine to Sit: Independent Sit to Supine: Independent Transfers Sit to Stand: Independent Stand to Sit: Independent  Trunk/Postural Assessment  Cervical Assessment Cervical Assessment: Within Functional Limits Thoracic Assessment Thoracic Assessment: Within Functional Limits Lumbar Assessment Lumbar Assessment: Within Functional Limits Postural Control Postural Control: Within Functional Limits  Balance Balance Balance Assessed: Yes Static Sitting Balance Static Sitting - Level of Assistance: 7: Independent Dynamic  Sitting Balance Dynamic Sitting - Level of Assistance: 7: Independent Static Standing Balance Static Standing - Balance Support: During functional activity Static Standing - Level of Assistance: 7: Independent Dynamic Standing Balance Dynamic Standing - Balance Support: During functional activity Dynamic Standing - Level of Assistance: 7: Independent Extremity/Trunk Assessment RUE Assessment RUE Assessment: Within Functional Limits Active Range of Motion (AROM) Comments: WFL General Strength Comments: 5/5 proximally and distally LUE Assessment LUE Assessment: Within Functional Limits Active Range of Motion (AROM) Comments: WFL General Strength Comments: 5/5 proximally and distally   Henry Schneider 07/19/2023,  2:55 PM

## 2023-07-19 NOTE — Plan of Care (Signed)
  Problem: RH Balance Goal: LTG Patient will maintain dynamic standing balance (PT) Description: LTG:  Patient will maintain dynamic standing balance with assistance during mobility activities (PT) 07/19/2023 1630 by Thaddeus Mliss LABOR, PT Outcome: Completed/Met 07/19/2023 1629 by Thaddeus Mliss LABOR, PT Flowsheets (Taken 07/16/2023 1416 by Gerianne Hardy, Student-PT) LTG: Pt will maintain dynamic standing balance during mobility activities with:: Independent   Problem: RH Ambulation Goal: LTG Patient will ambulate in controlled environment (PT) Description: LTG: Patient will ambulate in a controlled environment, # of feet with assistance (PT). 07/19/2023 1630 by Thaddeus Mliss LABOR, PT Outcome: Completed/Met 07/19/2023 1629 by Thaddeus Mliss LABOR, PT Flowsheets (Taken 07/16/2023 1416 by Gerianne Hardy, Student-PT) LTG: Pt will ambulate in controlled environ  assist needed:: Independent LTG: Ambulation distance in controlled environment: 200 Goal: LTG Patient will ambulate in home environment (PT) Description: LTG: Patient will ambulate in home environment, # of feet with assistance (PT). 07/19/2023 1630 by Thaddeus Mliss LABOR, PT Outcome: Completed/Met 07/19/2023 1629 by Thaddeus Mliss LABOR, PT Flowsheets (Taken 07/16/2023 1416 by Gerianne Hardy, Student-PT) LTG: Pt will ambulate in home environ  assist needed:: Independent LTG: Ambulation distance in home environment: 100 Goal: LTG Patient will ambulate in community environment (PT) Description: LTG: Patient will ambulate in community environment, # of feet with assistance (PT). 07/19/2023 1630 by Thaddeus Mliss LABOR, PT Outcome: Completed/Met 07/19/2023 1629 by Thaddeus Mliss LABOR, PT Flowsheets (Taken 07/16/2023 1416 by Gerianne Hardy, Student-PT) LTG: Pt will ambulate in community environ  assist needed:: Independent LTG: Ambulation distance in community environment: 1000   Problem: RH Stairs Goal: LTG Patient will ambulate up and down stairs w/assist  (PT) Description: LTG: Patient will ambulate up and down # of stairs with assistance (PT) 07/19/2023 1630 by Thaddeus Mliss LABOR, PT Outcome: Completed/Met 07/19/2023 1629 by Thaddeus Mliss LABOR, PT Flowsheets (Taken 07/16/2023 1416 by Gerianne Hardy, Student-PT) LTG: Pt will ambulate up/down stairs assist needed:: Independent LTG: Pt will  ambulate up and down number of stairs: 20

## 2023-07-19 NOTE — Progress Notes (Signed)
 Physical Therapy Session Note  Patient Details  Name: Aldred Mase MRN: [id number] Date of Birth: Feb 25, 1955  Today's Date: 07/19/2023 PT Missed Time: 75 Minutes Missed Time Reason: Other (Comment) (discharge home)  Short Term Goals: Week 1:  PT Short Term Goal 1 (Week 1): STG = LTG due to ELOS  Skilled Therapeutic Interventions/Progress Updates:  Received pt standing in room; brother just arrived and packing up to leave. Pt with no questions/concerns and verbalized feeling prepared for discharge. 75 minutes missed of skilled physical therapy.   Therapy Documentation Precautions:  Precautions Precautions: None Precaution/Restrictions Comments: aphasia Restrictions Weight Bearing Restrictions Per Provider Order: No   Therapy/Group: Individual Therapy Therisa HERO Zaunegger Therisa Stains PT, DPT 07/19/2023, 7:30 AM

## 2023-07-19 NOTE — Progress Notes (Signed)
 Physical Therapy Discharge Summary  Patient Details  Name: Henry Schneider MRN: [id number] Date of Birth: 03-23-1955  Date of Discharge from PT service:July 19, 2023  Today's Date: 07/19/2023 PT Individual Time: 9152-9070 PT Individual Time Calculation (min): 42 min   Patient has met 5 of 5 long term goals due to improved activity tolerance, improved balance, improved postural control, increased strength, ability to compensate for deficits, improved attention, improved awareness, and improved coordination.  Patient to discharge at an ambulatory level Independent.   Patient's care partner is independent to provide the necessary cognitive assistance at discharge.  Reasons goals not met: n/a  Recommendation:  Patient does not need ongoing skilled PT services  after discharge but will require continued Speech Therapy to continue to advance safe functional mobility  and address ongoing impairments in aphasia and understanding safety issues connected to inability to express self in society/ environment.  Equipment: No equipment provided  Reasons for discharge: treatment goals met and discharge from hospital  Patient/family agrees with progress made and goals achieved: Yes  PT Discharge Precautions/Restrictions Precautions Precautions: None Precaution/Restrictions Comments: aphasia Restrictions Weight Bearing Restrictions Per Provider Order: No Pain Interference Pain Interference Pain Effect on Sleep: 0. Does not apply - I have not had any pain or hurting in the past 5 days Pain Interference with Therapy Activities: 0. Does not apply - I have not received rehabilitationtherapy in the past 5 days Pain Interference with Day-to-Day Activities: 1. Rarely or not at all Cognition Overall Cognitive Status: Within Functional Limits for tasks assessed Arousal/Alertness: Awake/alert Orientation Level: Oriented X4 Attention: Sustained Sustained Attention: Appears intact Memory: Appears  intact Awareness: Appears intact Awareness Impairment: Emergent impairment Problem Solving: Appears intact Executive Function:  (mild decreased insight into deficits (i.e., why he needs supervision at d/c)) Safety/Judgment: Appears intact Comments: mild safety awareness deficits (mild decreased insight into deficits (i.e., why he needs supervision at d/c)) Sensation Sensation Light Touch: Appears Intact Coordination Gross Motor Movements are Fluid and Coordinated: Yes Fine Motor Movements are Fluid and Coordinated: Yes Motor  Motor Motor: Within Functional Limits Motor - Discharge Observations: [facility] for age  Mobility Bed Mobility Bed Mobility: Rolling Right;Rolling Left;Supine to Sit;Sit to Supine Rolling Right: Independent Rolling Left: Independent Supine to Sit: Independent Sit to Supine: Independent Transfers Transfers: Sit to Stand;Stand to Sit;Stand Pivot Transfers Sit to Stand: Independent Stand to Sit: Independent Stand Pivot Transfers: Independent Transfer (Assistive device): None Locomotion  Gait Ambulation: Yes Gait Assistance: Independent Gait Distance (Feet): 5000 Feet Assistive device: None Gait Gait: Yes Gait Pattern: Within Functional Limits Gait Pattern: Within Functional Limits Gait velocity: normal Stairs / Additional Locomotion Stairs: Yes Stairs Assistance: Independent Stair Management Technique: No rails Number of Stairs: 40 Ramp: Independent Curb: Independent Pick up small object from the floor assist level: Independent Wheelchair Mobility Wheelchair Mobility: No  Trunk/Postural Assessment  Cervical Assessment Cervical Assessment: Within Functional Limits Thoracic Assessment Thoracic Assessment: Within Functional Limits Lumbar Assessment Lumbar Assessment: Within Functional Limits Postural Control Postural Control: Within Functional Limits  Balance Balance Balance Assessed: Yes Static Sitting Balance Static Sitting - Balance  Support: Feet supported Static Sitting - Level of Assistance: 7: Independent Dynamic Sitting Balance Dynamic Sitting - Balance Support: Feet supported;During functional activity Dynamic Sitting - Level of Assistance: 7: Independent Static Standing Balance Static Standing - Balance Support: During functional activity Static Standing - Level of Assistance: 7: Independent Dynamic Standing Balance Dynamic Standing - Balance Support: During functional activity Dynamic Standing - Level of Assistance: 7: Independent Extremity  Assessment  RLE Assessment RLE Assessment: Within Functional Limits LLE Assessment LLE Assessment: Within Functional Limits  Skilled Intervention PT instructed pt in Grad day assessment to measure progress toward goals. See below for details. CARETool mobility assessment  also completed; see CAREtool tab in navigator for details.  Patient seated on loveseat on entrance to room. Patient alert and agreeable to PT session. Hops up, grabs gait belt and dons independently to leave room for session.   Patient with no pain complaint at start of session.  Ambulates >4000 feet around hospital campus weaving throughout parking decks, on/ off sidewalks, up/ down flights of steps, up/ down ramped areas, through grass. Throughout walk, pt tasked with progressing through alphabet and choosing foods that start with each letter. He requires some guidance in finding next letter in alphabet as well as finding correct word and pronouncing correctly around 50-60% of time.   Patient seated on loveseat in room at end of session with brakes locked, no alarm set, and all needs within reach.   Mliss DELENA Milliner PT, DPT, CSRS 07/19/2023, 7:07 AM

## 2023-07-19 NOTE — Progress Notes (Signed)
 PROGRESS NOTE   Subjective/Complaints:  Pt feeling well, slept well, denies pain. Says LBM was this morning but not documented since yesterday morning. Urinating fine per pt and documentation. No other complaints or concerns but pt aphasic.   ROS- limited by above    Objective:   No results found.  Recent Labs    07/17/23 0459  WBC 9.2  HGB 14.8  HCT 44.3  PLT 159   Recent Labs    07/17/23 0459  NA 139  K 4.4  CL 111  CO2 25  GLUCOSE 98  BUN 13  CREATININE 1.12  CALCIUM  8.8*    Intake/Output Summary (Last 24 hours) at 07/19/2023 1046 Last data filed at 07/19/2023 0827 Gross per 24 hour  Intake 1380 ml  Output --  Net 1380 ml        Physical Exam: Vital Signs Blood pressure 119/81, pulse 77, temperature (!) 97.5 F (36.4 C), temperature source Oral, resp. rate 18, height 6' 3 (1.905 m), weight 129 kg, SpO2 97%.   General: No acute distress, sitting up in chair.  Mood and affect are appropriate Heart: Regular rate and rhythm no rubs murmurs or extra sounds Lungs: Clear to auscultation, breathing unlabored, no rales or wheezes Abdomen: Positive bowel sounds, soft nontender to palpation, nondistended Extremities: No clubbing, cyanosis, or edema Skin: No evidence of breakdown, no evidence of rash over exposed surfaces.  Neuro: aphasia  PRIOR EXAMS: Neurologic: Cranial nerves II through XII intact, motor strength is 5/5 in bilateral deltoid, bicep, tricep, grip, hip flexor, knee extensors, ankle dorsiflexor and plantar flexor- needed visual/gestural cuing due to apraxia Reduced naming, oriented to person/place, not time but this may be more due to naming issues  Able to name glasses but not stethoscope Sensory exam difficult to assess due to aphasia   Musculoskeletal: Full range of motion in all 4 extremities except Right ankle mildly diminished DF/PF. No joint swelling   Assessment/Plan: 1.  Functional deficits which require 3+ hours per day of interdisciplinary therapy in a comprehensive inpatient rehab setting. Physiatrist is providing close team supervision and 24 hour management of active medical problems listed below. Physiatrist and rehab team continue to assess barriers to discharge/monitor patient progress toward functional and medical goals  Care Tool:  Bathing    Body parts bathed by patient: Right arm, Left arm, Chest, Abdomen, Front perineal area, Buttocks, Right upper leg, Right lower leg, Left upper leg, Left lower leg, Face         Bathing assist Assist Level: Independent     Upper Body Dressing/Undressing Upper body dressing   What is the patient wearing?: Pull over shirt    Upper body assist Assist Level: Independent    Lower Body Dressing/Undressing Lower body dressing      What is the patient wearing?: Pants, Underwear/pull up     Lower body assist Assist for lower body dressing: Independent     Toileting Toileting Toileting Activity did not occur (Clothing management and hygiene only): N/A (no void or bm)  Toileting assist Assist for toileting: Independent     Transfers Chair/bed transfer  Transfers assist     Chair/bed transfer assist level: Independent  Locomotion Ambulation   Ambulation assist      Assist level: Supervision/Verbal cueing Assistive device: No Device Max distance: 1000   Walk 10 feet activity   Assist     Assist level: Supervision/Verbal cueing     Walk 50 feet activity   Assist    Assist level: Supervision/Verbal cueing      Walk 150 feet activity   Assist    Assist level: Supervision/Verbal cueing      Walk 10 feet on uneven surface  activity   Assist     Assist level: Supervision/Verbal cueing     Wheelchair     Assist Is the patient using a wheelchair?: No             Wheelchair 50 feet with 2 turns activity    Assist            Wheelchair  150 feet activity     Assist          Blood pressure 119/81, pulse 77, temperature (!) 97.5 F (36.4 C), temperature source Oral, resp. rate 18, height 6' 3 (1.905 m), weight 129 kg, SpO2 97%.  Medical Problem List and Plan: 1. 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  2.  Antithrombotics: -DVT/anticoagulation:  Pharmaceutical: Eliquis              -antiplatelet therapy: N/A  3. Pain Management: Tylenol  prn  Hx of Right leg (? Tibial ) fx and chronic Right ankle stiffness  4. Mood/Behavior/Sleep: LCSW to follow for evaluation and support when available.             -antipsychotic agents: N/A 5. Neuropsych/cognition: This patient is capable of making decisions on his own behalf. 6. Skin/Wound Care: Routine skin checks-monitor R groin incision 7. Fluids/Electrolytes/Nutrition: Monitor I/O  check CMET in a.m.             -Diet Regular changed to Carb modified                8. Hyperlipidemia: LDL 44 Atorvastatin  resumed today    9. Hypertension: stable- home Valsartan not resumed   -07/19/23 BPs fine, monitor outpatient Vitals:   07/15/23 1611 07/15/23 1959 07/16/23 0516 07/16/23 1301  BP: 117/78 129/86 130/82 120/84   07/16/23 1950 07/17/23 0510 07/17/23 1315 07/17/23 2016  BP: 127/80 115/82 124/74 118/83   07/18/23 0444 07/18/23 1352 07/18/23 1938 07/19/23 0522  BP: 104/73 120/83 111/79 119/81                 10. T2DM: controlled A1c 6.0.?? resume metformin              - 6/13 glucose 112              -will check a few CBG's. If readings are reasonable then dc checks             -he may not need metformin if he's compliant with CM  diet  -07/19/23 CBGs fine, carb mod diet going home, no metformin for now,  monitor outpatient CBG (last 3)  Recent Labs    07/17/23 0624 07/18/23 0538 07/19/23 0542  GLUCAP 92 106* 116*   Controlled off meds  11. Leukocytosis: resolved    Latest Ref Rng & Units 07/17/2023    4:59 AM 07/16/2023    5:08 AM 07/11/2023   10:40 PM  CBC  WBC 4.0 - 10.5 K/uL 9.2  8.8  13.9   Hemoglobin 13.0 - 17.0 g/dL 85.1  85.1  83.4   Hematocrit 39.0 - 52.0 % 44.3  45.1  49.9   Platelets 150 - 400 K/uL 159  145  189     12. Tobacco: Discuss importance of smoking cessation and risk factors.  13. Constipation:             -no reported BM since admit              -he appears to be eating             -senna-s 2 tabs at bedtime        -07/19/23 LBM either yesterday or today, unclear; monitor outpatient      LOS: 4 days A FACE TO FACE EVALUATION WAS PERFORMED  [address] Mery Guadalupe 07/19/2023, 10:46 AM

## 2023-07-21 NOTE — Plan of Care (Signed)
  Problem: RH Comprehension Communication Goal: LTG Patient will comprehend basic/complex auditory (SLP) Description: LTG: Patient will comprehend basic/complex auditory information with cues (SLP). Outcome: Completed/Met   Problem: RH Expression Communication Goal: LTG Patient will express needs/wants via multi-modal(SLP) Description: LTG:  Patient will express needs/wants via multi-modal communication (gestures/written, etc) with cues (SLP) Outcome: Completed/Met Goal: LTG Patient will increase word finding of common (SLP) Description: LTG:  Patient will increase word finding of common objects/daily info/abstract thoughts with cues using compensatory strategies (SLP). Outcome: Completed/Met

## 2023-07-22 DIAGNOSIS — I639 Cerebral infarction, unspecified: Secondary | ICD-10-CM | POA: Insufficient documentation

## 2023-07-23 NOTE — Progress Notes (Signed)
 PMR Admission Coordinator Pre-Admission Assessment   Patient: Henry Schneider is an 68 y.o., male MRN: [id number] DOB: [birth year] Height: 6' 3 (190.5 cm) Weight: 127.7 kg   Insurance Information HMO:     PPO:      PCP:      IPA:      80/20:      OTHER:  PRIMARY: Medicare A and B      Policy#: [id number]       Subscriber: PtCM Name:       Phone#:      Fax#:  Pre-Cert#: verified Health and safety inspector:  Benefits:  Phone #:      Name:  Eff. Date: a and B 01/28/2022    Deduct: $1632      Out of Pocket Max: n/a      Life Max: n/a CIR: 100%      SNF: 20 full days Outpatient:      Co-Pay:  Home Health: 100%      Co-Pay:  DME:      Co-Pay:  Providers:  SECONDARY:    Financial Counselor:       Phone#:    The "Data Collection Information Summary" for patients in Inpatient Rehabilitation Facilities with attached "Privacy Act Statement-Health Care Records" was provided and verbally reviewed with: Patient   Emergency Contact Information Contact Information       Name Relation Home Work Mobile    [place] Brother [phone]             Other Contacts   None on File        Current Medical History  Patient Admitting Diagnosis: CVA History of Present Illness: Henry Schneider is a 68 y.o. male with hx of PE on Eliquis , possibly history of A-fib as well, history of systolic CHF/NICM with an EF of 15% in April 2017 with improvement in 2021 to 30% to 35%, status post [facility] Scientific CRT-D placement, HTN, HLD, chronic left bundle branch block, peripheral vascular disease, Buerger's disease, emphysema, tobacco abuse,  On 07/12/23, Pt. Presented to [facility] for AMS. Code stroke was activated. Teleneurology MD evaluated the patient. CT head showed evolving cytotoxic edema involving left insula and overlying left cerebral hemisphere consistent with evolving acute left MCA distribution infarct, aspects score 7, abnormal hyperdense proximal left MCA branch concerning for thrombus.  CT angiography head and neck with acute proximal M3 stump occlusion, inferior division of the left MCA, otherwise negative. He was transferred to [facility] 07/12/23 for cerebral Angiogram and thrombectomy. He was seen by PT/OT/SLP and they recommended CIR to assist return to PLOF.    Complete NIHSS TOTAL: 2   Patient's medical record from [facility]  has been reviewed by the rehabilitation admission coordinator and physician.   Past Medical History  No past medical history on file.       Has the patient had major surgery during 100 days prior to admission? yes   Family History   family history includes Coronary artery disease in his father and mother.   Current Medications  Current Medications    Current Facility-Administered Medications:    acetaminophen  (TYLENOL ) tablet 650 mg, 650 mg, Oral, Q4H PRN, 650 mg at 07/13/23 0738 **OR** acetaminophen  (TYLENOL ) 160 MG/5ML solution 650 mg, 650 mg, Per Tube, Q4H PRN **OR** acetaminophen  (TYLENOL ) suppository 650 mg, 650 mg, Rectal, Q4H PRN, Arora, Ashish, MD   apixaban  (ELIQUIS ) tablet 5 mg, 5 mg, Oral, BID, Carney, Camilo Cella  C, RPH, 5 mg at 07/14/23 1051   Chlorhexidine Gluconate Cloth 2 % PADS 6 each, 6 each, Topical, Daily, Arora, Ashish, MD, 6 each at 07/12/23 1145   clevidipine (CLEVIPREX) infusion 0.5 mg/mL, 0-21 mg/hr, Intravenous, Continuous, Reagan Camera, MD   iohexol  (OMNIPAQUE ) 300 MG/ML solution 150 mL, 150 mL, Intra-arterial, Once PRN, Reagan Camera, MD   Oral care mouth rinse, 15 mL, Mouth Rinse, PRN, Arora, Ashish, MD   senna-docusate (Senokot-S) tablet 1 tablet, 1 tablet, Oral, QHS PRN, Arora, Ashish, MD     Patients Current Diet:  Diet Order                  Diet regular Room service appropriate? Yes; Fluid consistency: Thin  Diet effective now                         Precautions / Restrictions Precautions Precautions: Fall, Other (comment) (aphasia) Restrictions Weight Bearing  Restrictions Per Provider Order: No    Has the patient had 2 or more falls or a fall with injury in the past year? No   Prior Activity Level Community (5-7x/wk): Pt. active int the community PTA   Prior Functional Level Self Care: Did the patient need help bathing, dressing, using the toilet or eating? Independent   Indoor Mobility: Did the patient need assistance with walking from room to room (with or without device)? Independent   Stairs: Did the patient need assistance with internal or external stairs (with or without device)? Independent   Functional Cognition: Did the patient need help planning regular tasks such as shopping or remembering to take medications? Independent   Patient Information Are you of Hispanic, Latino/a,or Spanish origin?: A. No, not of Hispanic, Latino/a, or Spanish origin What is your race?: A. White Do you need or want an interpreter to communicate with a doctor or health care staff?: 0. No   Patient's Response To:  Health Literacy and Transportation Is the patient able to respond to health literacy and transportation needs?: Yes Health Literacy - How often do you need to have someone help you when you read instructions, pamphlets, or other written material from your doctor or pharmacy?: Never In the past 12 months, has lack of transportation kept you from medical appointments or from getting medications?: No In the past 12 months, has lack of transportation kept you from meetings, work, or from getting things needed for daily living?: No   Home Assistive Devices / Equipment   Prior Device Use: Indicate devices/aids used by the patient prior to current illness, exacerbation or injury? None of the above   Current Functional Level Cognition   Arousal/Alertness: Awake/alert Overall Cognitive Status: Difficult to assess Orientation Level: Oriented to person    Extremity Assessment (includes Sensation/Coordination)   Upper Extremity Assessment: Defer to  OT evaluation  Lower Extremity Assessment: RLE deficits/detail RLE Deficits / Details: pt was slower or not pointing to R side to indicate he could feel light touch as he had done quickly and well for his L with his eyes closed, indicating potential sensation deficits, but difficult to fully assess due to language deficits; strength 5/5 grossly bil with MMT RLE Sensation: decreased light touch (questionable)     ADLs   Overall ADL's : Needs assistance/impaired Eating/Feeding: NPO Grooming: Minimal assistance, Standing Grooming Details (indicate cue type and reason): cues for sequence Lower Body Dressing: Set up, Sitting/lateral leans, Contact guard assist Lower Body Dressing Details (indicate cue type and reason):  for socks Functional mobility during ADLs: Contact guard assist     Mobility   Overal bed mobility: Needs Assistance Bed Mobility: Supine to Sit Supine to sit: Supervision, HOB elevated General bed mobility comments: Supervision for safety transitioning supine to sit L EOB with HOB elevated, cuing pt through gestures and stating up to perform transition.     Transfers   Overall transfer level: Needs assistance Equipment used: None Transfers: Sit to/from Stand Sit to Stand: Contact guard assist General transfer comment: CGA for safety standing from EOB     Ambulation / Gait / Stairs / Wheelchair Mobility   Ambulation/Gait Ambulation/Gait assistance: Contact guard assist, Min assist Gait Distance (Feet): 140 Feet Assistive device: None Gait Pattern/deviations: Step-through pattern, Decreased stride length, Drifts right/left General Gait Details: Pt tends to veer/sway to his R, intermittently needing minA for balance or to avoid obstacles/objects for his safety. He did bump into obstacles a couple times on the R. Sway noted with simulating stepping over obstacles and with head turns/nods. Gait velocity: reduced Gait velocity interpretation: 1.31 - 2.62 ft/sec, indicative  of limited community ambulator     Posture / Balance Balance Overall balance assessment: Mild deficits observed, not formally tested Standing balance support: No upper extremity supported Standing balance-Leahy Scale: Fair     Special needs/care consideration Special service needs none    Previous Home Environment (from acute therapy documentation) Living Arrangements: Alone  Lives With: Alone Available Help at Discharge: Family Type of Home: House Home Layout: One level Home Access: Stairs to enter Entrance Stairs-Rails: Right Entrance Stairs-Number of Steps: 3 Bathroom Shower/Tub: Engineer, manufacturing systems: Standard Bathroom Accessibility: Yes How Accessible: Accessible via walker Home Care Services: No Additional Comments: family lives in [place]  or [place] and pt here alone. Niece present in room was unable to answer questions about home set-up (per OT eval; no family present during PT Eval)   Discharge Living Setting Plans for Discharge Living Setting: House Type of Home at Discharge: House Discharge Home Layout: Able to live on main level with bedroom/bathroom Discharge Home Access: Stairs to enter Entrance Stairs-Rails: Right Entrance Stairs-Number of Steps: 3 Discharge Bathroom Shower/Tub: Tub/shower unit Discharge Bathroom Toilet: Standard Discharge Bathroom Accessibility: Yes How Accessible: Accessible via walker   Social/Family/Support Systems Patient Roles: Spouse Contact Information: [phone] Anticipated Caregiver: Wilhelmina Harari Ability/Limitations of Caregiver: 24/7Supevision to min A Caregiver Availability: 24/7 Discharge Plan Discussed with Primary Caregiver: Yes Is Caregiver In Agreement with Plan?: Yes Does Caregiver/Family have Issues with Lodging/Transportation while Pt is in Rehab?: No   Goals Patient/Family Goal for Rehab: PT/OT/SLP Supervision Expected length of stay: 5-7 days Pt/Family Agrees to Admission and willing to participate:  Yes Program Orientation Provided & Reviewed with Pt/Caregiver Including Roles  & Responsibilities: Yes   Decrease burden of Care through IP rehab admission: Not anticipated   Possible need for SNF placement upon discharge: not anticipated   Patient Condition: I have reviewed medical records from [facility], spoken with CM, and patient and family member. I met with patient at the bedside for inpatient rehabilitation assessment.  Patient will benefit from ongoing PT, OT, and SLP, can actively participate in 3 hours of therapy a day 5 days of the week, and can make measurable gains during the admission.  Patient will also benefit from the coordinated team approach during an Inpatient Acute Rehabilitation admission.  The patient will receive intensive therapy as well as Rehabilitation physician, nursing, social worker, and care management interventions.  Due to  safety, skin/wound care, disease management, medication administration, pain management, and patient education the patient requires 24 hour a day rehabilitation nursing.  The patient is currently min A- CGA with mobility and basic ADLs.  Discharge setting and therapy post discharge at home with home health is anticipated.  Patient has agreed to participate in the Acute Inpatient Rehabilitation Program and will admit today.   Preadmission Screen Completed By:  Dorena Gander, 07/14/2023 2:32 PM ______________________________________________________________________   Discussed status with Dr. Rachel Budds on 07/15/23 at 900 and received approval for admission today.   Admission Coordinator:  Dorena Gander, CCC-SLP, time 1030/Date 07/15/23    Assessment/Plan: Diagnosis: left mca infarct Does the need for close, 24 hr/day Medical supervision in concert with the patient's rehab needs make it unreasonable for this patient to be served in a less intensive setting? Yes Co-Morbidities requiring supervision/potential complications: CHF, HTN,  afib Due to bladder management, bowel management, safety, skin/wound care, disease management, medication administration, pain management, and patient education, does the patient require 24 hr/day rehab nursing? Yes Does the patient require coordinated care of a physician, rehab nurse, PT, OT, and SLP to address physical and functional deficits in the context of the above medical diagnosis(es)? Yes Addressing deficits in the following areas: balance, endurance, locomotion, strength, transferring, bowel/bladder control, bathing, dressing, feeding, grooming, toileting, cognition, and psychosocial support Can the patient actively participate in an intensive therapy program of at least 3 hrs of therapy 5 days a week? Yes The potential for patient to make measurable gains while on inpatient rehab is excellent Anticipated functional outcomes upon discharge from inpatient rehab: supervision PT, supervision OT, supervision SLP Estimated rehab length of stay to reach the above functional goals is: 5-7 days Anticipated discharge destination: Home 10. Overall Rehab/Functional Prognosis: excellent     MD Signature: Rawland Caddy, MD, [facility] Physical Medicine & Rehabilitation Medical Director Rehabilitation Services 07/15/2023

## 2023-07-29 ENCOUNTER — Ambulatory Visit

## 2023-09-04 ENCOUNTER — Ambulatory Visit: Admitting: Family Medicine

## 2023-10-22 ENCOUNTER — Telehealth: Payer: Self-pay

## 2023-10-22 NOTE — Patient Outreach (Signed)
 Telephone outreach to patient to obtain mRS was successfully completed. MRS= 1  Shereen Gin [facility] VBCI Assistant Direct Dial: [phone]  Fax: [phone] Website: [email or web address]

## 2023-10-28 ENCOUNTER — Ambulatory Visit (INDEPENDENT_AMBULATORY_CARE_PROVIDER_SITE_OTHER): Admitting: Family Medicine

## 2023-10-28 ENCOUNTER — Encounter: Payer: Self-pay | Admitting: Family Medicine

## 2023-10-28 VITALS — BP 116/72 | HR 67 | Temp 97.9°F | Ht 75.0 in | Wt 276.0 lb

## 2023-10-28 DIAGNOSIS — J439 Emphysema, unspecified: Secondary | ICD-10-CM | POA: Insufficient documentation

## 2023-10-28 DIAGNOSIS — E079 Disorder of thyroid, unspecified: Secondary | ICD-10-CM | POA: Diagnosis not present

## 2023-10-28 DIAGNOSIS — Z23 Encounter for immunization: Secondary | ICD-10-CM

## 2023-10-28 DIAGNOSIS — I63512 Cerebral infarction due to unspecified occlusion or stenosis of left middle cerebral artery: Secondary | ICD-10-CM

## 2023-10-28 DIAGNOSIS — I4891 Unspecified atrial fibrillation: Secondary | ICD-10-CM

## 2023-10-28 DIAGNOSIS — E6609 Other obesity due to excess calories: Secondary | ICD-10-CM | POA: Diagnosis not present

## 2023-10-28 DIAGNOSIS — I10 Essential (primary) hypertension: Secondary | ICD-10-CM | POA: Insufficient documentation

## 2023-10-28 DIAGNOSIS — Z6834 Body mass index (BMI) 34.0-34.9, adult: Secondary | ICD-10-CM | POA: Diagnosis not present

## 2023-10-28 DIAGNOSIS — Z0100 Encounter for examination of eyes and vision without abnormal findings: Secondary | ICD-10-CM

## 2023-10-28 DIAGNOSIS — Z87891 Personal history of nicotine dependence: Secondary | ICD-10-CM

## 2023-10-28 DIAGNOSIS — R918 Other nonspecific abnormal finding of lung field: Secondary | ICD-10-CM

## 2023-10-28 DIAGNOSIS — E119 Type 2 diabetes mellitus without complications: Secondary | ICD-10-CM | POA: Diagnosis not present

## 2023-10-28 DIAGNOSIS — E0500 Thyrotoxicosis with diffuse goiter without thyrotoxic crisis or storm: Secondary | ICD-10-CM

## 2023-10-28 DIAGNOSIS — E785 Hyperlipidemia, unspecified: Secondary | ICD-10-CM | POA: Insufficient documentation

## 2023-10-28 DIAGNOSIS — Z95810 Presence of automatic (implantable) cardiac defibrillator: Secondary | ICD-10-CM

## 2023-10-28 DIAGNOSIS — I519 Heart disease, unspecified: Secondary | ICD-10-CM | POA: Insufficient documentation

## 2023-10-28 DIAGNOSIS — I509 Heart failure, unspecified: Secondary | ICD-10-CM | POA: Insufficient documentation

## 2023-10-28 DIAGNOSIS — E66811 Obesity, class 1: Secondary | ICD-10-CM

## 2023-10-28 DIAGNOSIS — I739 Peripheral vascular disease, unspecified: Secondary | ICD-10-CM | POA: Insufficient documentation

## 2023-10-28 DIAGNOSIS — I159 Secondary hypertension, unspecified: Secondary | ICD-10-CM

## 2023-10-28 DIAGNOSIS — Z1211 Encounter for screening for malignant neoplasm of colon: Secondary | ICD-10-CM

## 2023-10-28 DIAGNOSIS — I5042 Chronic combined systolic (congestive) and diastolic (congestive) heart failure: Secondary | ICD-10-CM

## 2023-10-28 DIAGNOSIS — Z7689 Persons encountering health services in other specified circumstances: Secondary | ICD-10-CM

## 2023-10-28 DIAGNOSIS — I480 Paroxysmal atrial fibrillation: Secondary | ICD-10-CM | POA: Insufficient documentation

## 2023-10-28 DIAGNOSIS — I731 Thromboangiitis obliterans [Buerger's disease]: Secondary | ICD-10-CM | POA: Insufficient documentation

## 2023-10-28 DIAGNOSIS — J432 Centrilobular emphysema: Secondary | ICD-10-CM

## 2023-10-28 LAB — CBC WITH DIFFERENTIAL/PLATELET
Basophils Absolute: 0 K/uL (ref 0.0–0.1)
Basophils Relative: 0.4 % (ref 0.0–3.0)
Eosinophils Absolute: 0.2 K/uL (ref 0.0–0.7)
Eosinophils Relative: 1.4 % (ref 0.0–5.0)
HCT: 49.9 % (ref 39.0–52.0)
Hemoglobin: 16.3 g/dL (ref 13.0–17.0)
Lymphocytes Relative: 20.6 % (ref 12.0–46.0)
Lymphs Abs: 2.4 K/uL (ref 0.7–4.0)
MCHC: 32.6 g/dL (ref 30.0–36.0)
MCV: 93.7 fl (ref 78.0–100.0)
Monocytes Absolute: 0.9 K/uL (ref 0.1–1.0)
Monocytes Relative: 7.9 % (ref 3.0–12.0)
Neutro Abs: 8.2 K/uL — ABNORMAL HIGH (ref 1.4–7.7)
Neutrophils Relative %: 69.7 % (ref 43.0–77.0)
Platelets: 177 K/uL (ref 150.0–400.0)
RBC: 5.32 Mil/uL (ref 4.22–5.81)
RDW: 14.8 % (ref 11.5–15.5)
WBC: 11.7 K/uL — ABNORMAL HIGH (ref 4.0–10.5)

## 2023-10-28 LAB — COMPREHENSIVE METABOLIC PANEL WITH GFR
ALT: 21 U/L (ref 0–53)
AST: 15 U/L (ref 0–37)
Albumin: 4.1 g/dL (ref 3.5–5.2)
Alkaline Phosphatase: 87 U/L (ref 39–117)
BUN: 24 mg/dL — ABNORMAL HIGH (ref 6–23)
CO2: 26 meq/L (ref 19–32)
Calcium: 9.8 mg/dL (ref 8.4–10.5)
Chloride: 107 meq/L (ref 96–112)
Creatinine, Ser: 1.1 mg/dL (ref 0.40–1.50)
GFR: 69.86 mL/min (ref >60.00)
Glucose, Bld: 107 mg/dL — ABNORMAL HIGH (ref 70–99)
Potassium: 4.3 meq/L (ref 3.5–5.1)
Sodium: 141 meq/L (ref 135–145)
Total Bilirubin: 1.2 mg/dL (ref 0.2–1.2)
Total Protein: 6.8 g/dL (ref 6.0–8.3)

## 2023-10-28 LAB — LIPID PANEL
Cholesterol: 111 mg/dL (ref 0–200)
HDL: 47.5 mg/dL (ref >39.00)
LDL Cholesterol: 52 mg/dL (ref 0–99)
NonHDL: 63.91
Total CHOL/HDL Ratio: 2
Triglycerides: 62 mg/dL (ref 0.0–149.0)
VLDL: 12.4 mg/dL (ref 0.0–40.0)

## 2023-10-28 LAB — MICROALBUMIN / CREATININE URINE RATIO
Creatinine,U: 81.2 mg/dL
Microalb Creat Ratio: UNDETERMINED mg/g (ref 0.0–30.0)
Microalb, Ur: <0.7 mg/dL

## 2023-10-28 LAB — HEMOGLOBIN A1C: Hgb A1c MFr Bld: 6.7 % — ABNORMAL HIGH (ref 4.6–6.5)

## 2023-10-28 LAB — TSH: TSH: 0.02 u[IU]/mL — ABNORMAL LOW (ref 0.35–5.50)

## 2023-10-28 NOTE — Patient Instructions (Addendum)
-  It was nice to meet you and look forward to taking care of you.  -Continue all medications. -Ordered labs and urine. Office will call with lab results and will be available MyChart. -Placed a referral to the following: pulmonary, cardiology/electrophysiology, neurology, GI, speech therapy, ophthalmology, and endocrinology. Please call the office or send a MyChart message if you do not receive a phone call or a MyChart message about appointment in 2 weeks.  -Follow up in 2 weeks.

## 2023-10-28 NOTE — Progress Notes (Unsigned)
 New Patient Office Visit  Subjective   Patient ID: Henry Schneider, male    DOB: [birth year]  Age: 68 y.o. MRN: [id number]  CC:  Chief Complaint  Patient presents with  . Establish Care    HPI Chad Donoghue presents to establish care with new patient.  Patients previous primary care provider: [facility] with Dr. Lamar Koyanagi.   Specialist: [facility] with Dr. Darrell Locus [facility] with Dr. Comer English -last seen in 06/2018  [facility] with Dr. Darletta Laurence seen in 12/2019   Patient originally was living in [place]  until he moved to the [place] area back in [place]24 permanently. He reports he was between locations for the last 10 years. On 07/12/2023, he had an acute ischemic left MCA stroke. He presented to [facility] with aphasia and unable to provide history.  Code stroke was activated in the setting of aphasia last known well unknown.CTA head/neck showed acute proximal M3 stump occlusion, inferior division of the left MCA, otherwise negative. On 07/12/23 Dr. Ray performed cerebral angio of left M2/M3 thrombectomy with findings for relatively large transverse M3 branch perfusing the posterior left parietal lobe. Suction thrombectomy was performed x 3 with partial recanalization. No evidence of hemorrhage. MRI of brain acute left MCA territory infarcts with trace right word midline shift. Eliquis  was resumed. Echo EF 25%-30%. Stroke felt to be cardioembolic versus LVO but hard to determine per Dr. Rosemarie.   He transferred to [facility] for ongoing PT, OT, and ST on 07/15/2023. He was suppose to follow up [facility] in 8 weeks or a neurologist in [place] . However, patient has not seen one since his stroke. He was discharge from rehab on 07/18/2023. He was to continue Eliquis ; Atorvastatin  for hyperlipidemia;Valsartan  and Metformin were not resumed;   Outpatient Encounter Medications as of 10/28/2023  Medication Sig  . apixaban  (ELIQUIS ) 5 MG TABS tablet Take 1 tablet (5 mg total) by mouth 2 (two) times daily.  . atorvastatin  (LIPITOR) 40 MG tablet Take 1 tablet (40 mg total) by mouth daily.  . dapagliflozin  propanediol (FARXIGA ) 10 MG TABS tablet Take 1 tablet (10 mg total) by mouth daily.  . furosemide  (LASIX ) 20 MG tablet Take 1 tablet (20 mg total) by mouth daily.  . metoprolol  succinate (TOPROL -XL) 50 MG 24 hr tablet Take 2 tablets (100mg ) by mouth in the morning and 1 tablet (50mg ) in the afternoon.  . spironolactone  (ALDACTONE ) 25 MG tablet Take 0.5 tablets (12.5 mg total) by mouth daily.  . [DISCONTINUED] senna-docusate (SENOKOT-S) 8.6-50 MG tablet Take 2 tablets by mouth at bedtime.   No facility-administered encounter medications on file as of 10/28/2023.    Past Medical History:  Diagnosis Date  . Berger's disease   . Diabetes mellitus without complication (HCC)   . Heart disease   . Stroke (HCC)   . Thyroid disease     Past Surgical History:  Procedure Laterality Date  . IR CT HEAD LTD  07/12/2023  . IR PERCUTANEOUS ART THROMBECTOMY/INFUSION INTRACRANIAL INC DIAG ANGIO  07/12/2023  . IR US  GUIDE VASC ACCESS RIGHT  07/12/2023  . RADIOLOGY WITH ANESTHESIA N/A 07/12/2023   Procedure: RADIOLOGY WITH ANESTHESIA;  Surgeon: Radiologist, Medication, MD;  Location: [facility] OR;  Service: Radiology;  Laterality: N/A;    Family History  Problem Relation Age of Onset  . Coronary artery disease Mother   . Coronary artery disease Father   .  Cancer Brother     Social History   Socioeconomic History  . Marital status: Single    Spouse name: Not on file  . Number of children: 0  . Years of education: Not on file  . Highest education level: High school graduate  Occupational History  . Not on file  Tobacco Use  . Smoking status: Every Day    Current packs/day: 1.00    Types: Cigarettes  .  Smokeless tobacco: Not on file  Vaping Use  . Vaping status: Never Used  Substance and Sexual Activity  . Alcohol use: Not Currently    Comment: Once a month  . Drug use: Yes    Comment: CBD gummies  . Sexual activity: Yes  Other Topics Concern  . Not on file  Social History Narrative  . Not on file   Social Drivers of Health   Financial Resource Strain: Low Risk  (10/28/2023)   Overall Financial Resource Strain (CARDIA)   . Difficulty of Paying Living Expenses: Not hard at all  Food Insecurity: No Food Insecurity (10/28/2023)   Hunger Vital Sign   . Worried About Programme researcher, broadcasting/film/video in the Last Year: Never true   . Ran Out of Food in the Last Year: Never true  Transportation Needs: No Transportation Needs (10/28/2023)   PRAPARE - Transportation   . Lack of Transportation (Medical): No   . Lack of Transportation (Non-Medical): No  Physical Activity: Sufficiently Active (10/28/2023)   Exercise Vital Sign   . Days of Exercise per Week: 6 days   . Minutes of Exercise per Session: 50 min  Stress: No Stress Concern Present (10/28/2023)   [employer] of Occupational Health - Occupational Stress Questionnaire   . Feeling of Stress: Not at all  Social Connections: Socially Isolated (10/28/2023)   Social Connection and Isolation Panel   . Frequency of Communication with Friends and Family: Never   . Frequency of Social Gatherings with Friends and Family: Never   . Attends Religious Services: Never   . Active Member of Clubs or Organizations: Yes   . Attends Banker Meetings: Never   . Marital Status: Never married  Intimate Partner Violence: Not At Risk (10/28/2023)   Humiliation, Afraid, Rape, and Kick questionnaire   . Fear of Current or Ex-Partner: No   . Emotionally Abused: No   . Physically Abused: No   . Sexually Abused: No    ROS See HPI above    Objective  BP 116/72   Pulse 67   Temp 97.9 F (36.6 C) (Oral)   Ht 6' 3 (1.905 m)   Wt 276 lb (125.2  kg)   SpO2 97%   BMI 34.50 kg/m   Physical Exam    Assessment & Plan:  Thyroid disease -     TSH  Heart disease  Diabetes mellitus without complication (HCC) -     Microalbumin / creatinine urine ratio -     Ambulatory referral to Ophthalmology -     Comprehensive metabolic panel with GFR -     Hemoglobin A1c  Acute ischemic left MCA stroke [facility]) -     Ambulatory referral to Neurology -     Ambulatory referral to Speech Therapy  Encounter to establish care  Colon cancer screening -     Ambulatory referral to Gastroenterology  History of tobacco abuse -     Ambulatory Referral for Lung Cancer Scre  Encounter for complete eye exam -  Ambulatory referral to Ophthalmology  ICD (implantable cardioverter-defibrillator) in place -     Ambulatory referral to Cardiology -     Ambulatory referral to Cardiac Electrophysiology  Chronic combined systolic and diastolic CHF (congestive heart failure) (HCC) -     Ambulatory referral to Cardiology  Graves disease -     Ambulatory referral to Endocrinology  Class 1 obesity due to excess calories with serious comorbidity and body mass index (BMI) of 34.0 to 34.9 in adult -     CBC with Differential/Platelet -     Comprehensive metabolic panel with GFR -     Lipid panel  1.Received health maintenance:  -Colonoscopy: Referral to GI -Covid booster: Unsure  -Hep C: Declines  -Influenza vaccine: Administered  -Lung Cancer Screening: Referral to Pulmonary  -Ophthalmology: Placed a referral  -Zoster vaccine: Declines   Return in about 2 weeks (around 11/11/2023) for follow-up.   Jovane Foutz, NP

## 2023-10-30 ENCOUNTER — Ambulatory Visit

## 2023-10-30 ENCOUNTER — Ambulatory Visit: Payer: Self-pay | Admitting: Family Medicine

## 2023-10-30 DIAGNOSIS — E0500 Thyrotoxicosis with diffuse goiter without thyrotoxic crisis or storm: Secondary | ICD-10-CM

## 2023-10-31 ENCOUNTER — Ambulatory Visit (INDEPENDENT_AMBULATORY_CARE_PROVIDER_SITE_OTHER)

## 2023-10-31 DIAGNOSIS — E0500 Thyrotoxicosis with diffuse goiter without thyrotoxic crisis or storm: Secondary | ICD-10-CM

## 2023-10-31 LAB — T3: T3, Total: 119 ng/dL (ref 76–181)

## 2023-10-31 LAB — T4, FREE: Free T4: 1.29 ng/dL (ref 0.60–1.60)

## 2023-11-04 ENCOUNTER — Ambulatory Visit: Payer: Self-pay | Admitting: Family Medicine

## 2023-11-04 DIAGNOSIS — E0500 Thyrotoxicosis with diffuse goiter without thyrotoxic crisis or storm: Secondary | ICD-10-CM

## 2023-11-04 MED ORDER — METHIMAZOLE 5 MG PO TABS: 5.0000 mg | ORAL_TABLET | Freq: Every day | ORAL | 0 refills | Status: DC

## 2023-11-06 ENCOUNTER — Telehealth: Payer: Self-pay | Admitting: Acute Care

## 2023-11-06 ENCOUNTER — Encounter: Payer: Self-pay | Admitting: Physician Assistant

## 2023-11-06 DIAGNOSIS — F17210 Nicotine dependence, cigarettes, uncomplicated: Secondary | ICD-10-CM

## 2023-11-06 DIAGNOSIS — Z87891 Personal history of nicotine dependence: Secondary | ICD-10-CM

## 2023-11-06 DIAGNOSIS — Z122 Encounter for screening for malignant neoplasm of respiratory organs: Secondary | ICD-10-CM

## 2023-11-06 NOTE — Telephone Encounter (Signed)
 Lung Cancer Screening Narrative/Criteria Questionnaire (Cigarette Smokers Only- No Cigars/Pipes/vapes)   Henry Schneider   SDMV:11/12/2023 11:15 [place]        03-Jun-1955   LDCT: 11/13/2023 10:00 [facility] [place]     68 y.o.   Phone: [phone]  Lung Screening Narrative (confirm age 59-77 yrs Medicare / 50-80 yrs Private pay insurance)   Solicitor and [employer]   Referring Provider:Joanna Billy, NP   This screening involves an initial phone call with a team member from our program. It is called a shared decision making visit. The initial meeting is required by  insurance and Medicare to make sure you understand the program. This appointment takes about 15-20 minutes to complete. You will complete the screening scan at your scheduled date/time.  This scan takes about 5-10 minutes to complete. You can eat and drink normally before and after the scan.  Criteria questions for Lung Cancer Screening:   Are you a current or former smoker? Current Age began smoking: 68yo   If you are a former smoker, what year did you quit smoking? N/A(within 15 yrs)   To calculate your smoking history, I need an accurate estimate of how many packs of cigarettes you smoked per day and for how many years. (Not just the number of PPD you are now smoking)   Years smoking 44 x Packs per day 1 = Pack years 44   (at least 20 pack yrs)   (Make sure they understand that we need to know how much they have smoked in the past, not just the number of PPD they are smoking now)  Do you have a personal history of cancer?  No    Do you have a family history of cancer? Yes  (cancer type and and relative) Father - unsure of type   Are you coughing up blood?  No  Have you had unexplained weight loss of 15 lbs or more in the last 6 months? No  It looks like you meet all criteria.  When would be a good time for us  to schedule you for this screening?   Additional information: N/A

## 2023-11-11 ENCOUNTER — Ambulatory Visit: Payer: Self-pay | Admitting: Family Medicine

## 2023-11-11 ENCOUNTER — Encounter: Payer: Self-pay | Admitting: Family Medicine

## 2023-11-11 ENCOUNTER — Ambulatory Visit: Admitting: Family Medicine

## 2023-11-11 VITALS — BP 124/80 | HR 81 | Temp 98.0°F | Ht 75.0 in | Wt 274.0 lb

## 2023-11-11 DIAGNOSIS — D72829 Elevated white blood cell count, unspecified: Secondary | ICD-10-CM

## 2023-11-11 DIAGNOSIS — E119 Type 2 diabetes mellitus without complications: Secondary | ICD-10-CM | POA: Diagnosis not present

## 2023-11-11 DIAGNOSIS — E0500 Thyrotoxicosis with diffuse goiter without thyrotoxic crisis or storm: Secondary | ICD-10-CM | POA: Diagnosis not present

## 2023-11-11 DIAGNOSIS — Z716 Tobacco abuse counseling: Secondary | ICD-10-CM | POA: Diagnosis not present

## 2023-11-11 DIAGNOSIS — D729 Disorder of white blood cells, unspecified: Secondary | ICD-10-CM

## 2023-11-11 DIAGNOSIS — D751 Secondary polycythemia: Secondary | ICD-10-CM

## 2023-11-11 LAB — CBC WITH DIFFERENTIAL/PLATELET
Basophils Absolute: 0.1 K/uL (ref 0.0–0.1)
Basophils Relative: 0.5 % (ref 0.0–3.0)
Eosinophils Absolute: 0.2 K/uL (ref 0.0–0.7)
Eosinophils Relative: 1.4 % (ref 0.0–5.0)
HCT: 52.6 % — ABNORMAL HIGH (ref 39.0–52.0)
Hemoglobin: 17.1 g/dL — ABNORMAL HIGH (ref 13.0–17.0)
Lymphocytes Relative: 18 % (ref 12.0–46.0)
Lymphs Abs: 2.1 K/uL (ref 0.7–4.0)
MCHC: 32.4 g/dL (ref 30.0–36.0)
MCV: 94.6 fl (ref 78.0–100.0)
Monocytes Absolute: 1 K/uL (ref 0.1–1.0)
Monocytes Relative: 8.6 % (ref 3.0–12.0)
Neutro Abs: 8.4 K/uL — ABNORMAL HIGH (ref 1.4–7.7)
Neutrophils Relative %: 71.5 % (ref 43.0–77.0)
Platelets: 177 K/uL (ref 150.0–400.0)
RBC: 5.56 Mil/uL (ref 4.22–5.81)
RDW: 14.9 % (ref 11.5–15.5)
WBC: 11.8 K/uL — ABNORMAL HIGH (ref 4.0–10.5)

## 2023-11-11 NOTE — Progress Notes (Signed)
 Established Patient Office Visit   Subjective:  Patient ID: Henry Schneider, male    DOB: [birth year]  Age: 68 y.o. MRN: [id number]  Chief Complaint  Patient presents with   Medical Management of Chronic Issues    2 week follow up     HPI Patient is here for a 2 week follow up from initial visit. He had an acute ischemic left MCA stroke on 07/12/2023. He presented to [facility] with aphasia and unable to provide history.  Code stroke was activated in the setting of aphasia last known well unknown.CTA head/neck showed acute proximal M3 stump occlusion, inferior division of the left MCA, otherwise negative. On 07/12/23 Dr. Ray performed cerebral angio of left M2/M3 thrombectomy with findings for relatively large transverse M3 branch perfusing the posterior left parietal lobe. Suction thrombectomy was performed x 3 with partial recanalization. No evidence of hemorrhage. MRI of brain acute left MCA territory infarcts with trace right word midline shift. Eliquis  was resumed. Echo EF 25%-30%. Stroke felt to be cardioembolic versus LVO but hard to determine per Dr. Rosemarie.   He transferred to [facility] for ongoing PT, OT, and ST on 07/15/2023. He was suppose to follow up [facility] in 8 weeks or a neurologist in [place] . However, patient has not seen since his stroke. He was discharge from rehab on 07/18/2023. He was to continue Eliquis ; Atorvastatin  for hyperlipidemia; Valsartan and Metformin were not resumed; He reports for a short time until recently, he moved back up to [place]  to live with his brother.  While in [place] , patient got back in contact previous PCP who was able to set up speech therapy through home health. Patient has [employer] CRT Implant that has been interrogated to his previous cardiology on 09/15/2023.  He is still having trouble finding some words to communicate, but for the most part able to understand him.  Based on chart review and subjective data, he has only had speech therapy since his stroke. He reports he has moved back down to [place] and needs follow up with specialist.  At last appointment, he was referred to several specialist: speech therapy, GI, endocrinology, neurology, cardiology, cardiac electrophysiology, ophthalmology, and pulmonary. He has made appointments with all specialist, except endocrinology and speech therapy.  ROS See HPI above     Objective:   BP 124/80   Pulse 81   Temp 98 F (36.7 C) (Oral)   Ht 6' 3 (1.905 m)   Wt 274 lb (124.3 kg)   SpO2 97%   BMI 34.25 kg/m    Physical Exam Vitals reviewed.  Constitutional:      General: He is not in acute distress.    Appearance: Normal appearance. He is obese. He is not ill-appearing, toxic-appearing or diaphoretic.  HENT:     Head: Normocephalic and atraumatic.  Eyes:     General:        Right eye: No discharge.        Left eye: No discharge.     Conjunctiva/sclera: Conjunctivae normal.  Cardiovascular:     Rate and Rhythm: Normal rate.  Pulmonary:     Effort: Pulmonary effort is normal. No respiratory distress.  Musculoskeletal:        General: Normal range of motion.  Skin:    General: Skin is warm and dry.  Neurological:     General: No focal deficit present.     Mental Status: He is alert and oriented to person, place, and  time. Mental status is at baseline.  Psychiatric:        Mood and Affect: Mood normal.        Behavior: Behavior normal.        Thought Content: Thought content normal.        Judgment: Judgment normal.      Assessment & Plan:  Diabetes mellitus without complication (HCC)  Leukocytosis, unspecified type -     CBC with Differential/Platelet  Graves disease -     TSH; Future -     T4, free; Future -     T3; Future  Encounter for smoking cessation counseling -     Ambulatory referral to Smoking Cessation  -Recommend to schedule an appointment with endocrinology and speech  therapy. A letter was sent through MyChart. -Ordered CBC to recheck white blood count. Last WBC was 11.7 and denies any ill symptoms.Office will call with lab results and will be available via MyChart.  -Ordered TSH, T3, and Free T4 for Graves disease that will need to be drawn in 6 weeks. Restarted Methimazole until he can be seen by endocrinologist.  -Discussed about increase in A1c, 6.7. Continue Farxiga  10mg  daily. Encourage to decrease carbohydrates (pastas, rice, potatoes, and breads) and sweets. Recommend to exercise as tolerated at least 150 minutes a week. He reports was eating more sweets, such as ice cream. Since labs have been drawn, he reports he has decreased eating sweets.  -Advised to please have the eye doctor [facility]) send us  a copy of his visit. He reports appointment is on 11/14/2023.  -Placed a referral to Smoking Cessation Program. Advised to please call the office or send a MyChart message if he does not receive a phone call or a MyChart message about appointment in 2 weeks.   Return in about 3 months (around 02/11/2024) for follow-up; Lab appointemnt in 6 weeks-thyroid labs. AWV-Beverly, LPN .   Clayton Bosserman, NP

## 2023-11-11 NOTE — Patient Instructions (Addendum)
-  It was great to see you today. You did a great job with scheduling your specialist appointments.  -Recommend to schedule an appointment with endocrinology and speech therapy. A letter was send through MyChart. -Ordered CBC to recheck white blood count. Office will call with lab results and will be available via MyChart.  -Ordered TSH, T3, and Free T4 for Graves disease that will need to be drawn in 6 weeks.  -Discussed about increase in A1c, 6.7. Continue Farxiga  10mg  daily. Encourage to decrease carbohydrates (pastas, rice, potatoes, and breads) and sweets. Recommend to exercise as tolerated at least 150 minutes a week.  -Please have the eye doctor [facility]) send us  a copy of your visit notes.  -Placed a referral to Smoking Cessation Program. Please call the office or send a MyChart message if you do not receive a phone call or a MyChart message about appointment in 2 weeks.  -Make 3 appointments: 3 month follow up to see me, lab appointment in 6 weeks, and Annual Wellness Visit with Rojelio, LPN in office.

## 2023-11-12 ENCOUNTER — Encounter: Payer: Self-pay | Admitting: Adult Health

## 2023-11-12 ENCOUNTER — Ambulatory Visit: Admitting: Adult Health

## 2023-11-12 DIAGNOSIS — F17210 Nicotine dependence, cigarettes, uncomplicated: Secondary | ICD-10-CM | POA: Diagnosis not present

## 2023-11-12 NOTE — Patient Instructions (Signed)

## 2023-11-12 NOTE — Progress Notes (Signed)
  Virtual Visit via Telephone Note  I connected with Henry Schneider , 11/12/23 11:23 AM by a telemedicine application and verified that I am speaking with the correct person using two identifiers.  Location: Patient: home Provider: home   I discussed the limitations of evaluation and management by telemedicine and the availability of in person appointments. The patient expressed understanding and agreed to proceed.   Shared Decision Making Visit Lung Cancer Screening Program [phone])   Eligibility: 68 y.o. Pack Years Smoking History Calculation = 44 pack year (# packs/per year x # years smoked) Recent History of coughing up blood  no Unexplained weight loss? no ( >Than 15 pounds within the last 6 months ) Prior History Lung / other cancer no (Diagnosis within the last 5 years already requiring surveillance chest CT Scans). Smoking Status Current Smoker   Visit Components: Discussion included one or more decision making aids. YES Discussion included risk/benefits of screening. YES Discussion included potential follow up diagnostic testing for abnormal scans. YES Discussion included meaning and risk of over diagnosis. YES Discussion included meaning and risk of False Positives. YES Discussion included meaning of total radiation exposure. YES  Counseling Included: Importance of adherence to annual lung cancer LDCT screening. YES Impact of comorbidities on ability to participate in the program. YES Ability and willingness to under diagnostic treatment. YES  Smoking Cessation Counseling: Current Smokers:  Discussed importance of smoking cessation. yes Information about tobacco cessation classes and interventions provided to patient. yes Patient provided with ticket for LDCT Scan. yes Symptomatic Patient. NO Diagnosis Code: Tobacco Use Z72.0 Asymptomatic Patient yes  Counseling - 4 minutes of smoking cessation counseling (CT Chest Lung Cancer Screening Low Dose W/O CM)  [id number]  Z12.2-Screening of respiratory organs Z87.891-Personal history of nicotine dependence   [name] 11/12/23

## 2023-11-13 ENCOUNTER — Inpatient Hospital Stay: Admission: RE | Admit: 2023-11-13 | Source: Ambulatory Visit

## 2023-11-13 ENCOUNTER — Ambulatory Visit
Admission: RE | Admit: 2023-11-13 | Discharge: 2023-11-13 | Disposition: A | Discharge status: Home / Self Care | Source: Ambulatory Visit | Attending: Acute Care | Admitting: Acute Care

## 2023-11-13 DIAGNOSIS — Z122 Encounter for screening for malignant neoplasm of respiratory organs: Secondary | ICD-10-CM

## 2023-11-13 DIAGNOSIS — F17210 Nicotine dependence, cigarettes, uncomplicated: Secondary | ICD-10-CM

## 2023-11-13 DIAGNOSIS — Z87891 Personal history of nicotine dependence: Secondary | ICD-10-CM

## 2023-11-14 ENCOUNTER — Encounter: Payer: Self-pay | Admitting: Family Medicine

## 2023-11-14 ENCOUNTER — Telehealth: Payer: Self-pay

## 2023-11-14 LAB — OPHTHALMOLOGY REPORT-SCANNED

## 2023-11-14 NOTE — Progress Notes (Signed)
 Care Guide Pharmacy Note  11/14/2023 Name: Zurich Carreno MRN: [id number] DOB: 05/17/1955  Referred By: Billy Philippe SAUNDERS, NP Reason for referral: Complex Care Management (Outreach to schedule referral with PharmD)   Hasnain Manheim is a 68 y.o. year old male who is a primary care patient of Billy Philippe SAUNDERS, NP.  Erhard Senske was referred to the pharmacist for assistance related to: smoking cessation.  An unsuccessful telephone outreach was attempted today to contact the patient who was referred to the pharmacy team for assistance with medication management. Additional attempts will be made to contact the patient.  [facility]  [facility] Guide  Direct Dial: [phone]  Fax [phone]

## 2023-11-17 ENCOUNTER — Telehealth: Payer: Self-pay

## 2023-11-17 ENCOUNTER — Encounter: Payer: Self-pay | Admitting: Anesthesiology

## 2023-11-17 NOTE — Telephone Encounter (Addendum)
 Results reviewed by Ruthell, NP. Please call patient and review recent Lung CT results. He will need a PET scan and a nodule follow up consult to review results. Place order. Results and plan to PCP. If PET can be completed this week, patient can be added to to Groce, NP schedule 10/27 at 11:30 or ask Noelle if Byrum can see him on 10/29 in the 10:30 and 10:45 am slot. Please put comments in order for scheduler. Patient will need a nodule follow up consult.   IMPRESSION: 1. New pleural mass in the posterior lower left hemithorax. Lung-RADS 4B, suspicious. Additional imaging evaluation or consultation with Pulmonology or Thoracic Surgery recommended. These results will be called to the ordering clinician or representative by the Radiologist Assistant, and communication documented in the PACS or [employer]. 2. Aortic atherosclerosis (ICD10-I70.0). Coronary artery calcification. 3. Enlarged pulmonic trunk, indicative of pulmonary arterial hypertension. 4.  Emphysema (ICD10-J43.9).

## 2023-11-17 NOTE — Progress Notes (Signed)
 Care Guide Pharmacy Note  11/17/2023 Name: [name] MRN: [id number] DOB: 04-02-1955  Referred By: Billy Philippe SAUNDERS, NP Reason for referral: Complex Care Management (Outreach to schedule referral with PharmD)   Henry Schneider is a 68 y.o. year old male who is a primary care patient of Billy Philippe SAUNDERS, NP.  Francisca Harbuck was referred to the pharmacist for assistance related to: smoking cessation   Successful contact was made with the patient to discuss pharmacy services including being ready for the pharmacist to call at least 5 minutes before the scheduled appointment time and to have medication bottles and any blood pressure readings ready for review. The patient agreed to meet with the pharmacist via telephone visit on 12/01/2023  Thedford Franks, CMA [place]  [facility], [facility] Guide Direct Dial: [phone]  Fax: [phone] Website: [email or web address]

## 2023-11-18 ENCOUNTER — Other Ambulatory Visit: Payer: Self-pay

## 2023-11-18 DIAGNOSIS — Z87891 Personal history of nicotine dependence: Secondary | ICD-10-CM

## 2023-11-18 DIAGNOSIS — R911 Solitary pulmonary nodule: Secondary | ICD-10-CM

## 2023-11-18 DIAGNOSIS — Z122 Encounter for screening for malignant neoplasm of respiratory organs: Secondary | ICD-10-CM

## 2023-11-18 NOTE — Telephone Encounter (Signed)
 Called and spoke with the patient. Reviewed recent Lung CT results. He will complete a PET and follow up with Ruthell, NP or Dr. Shelah. Order placed. Scheduling instructions applied. Results and plan to PCP.

## 2023-11-19 ENCOUNTER — Ambulatory Visit: Attending: Family Medicine

## 2023-11-19 DIAGNOSIS — R4701 Aphasia: Secondary | ICD-10-CM | POA: Insufficient documentation

## 2023-11-19 DIAGNOSIS — I63512 Cerebral infarction due to unspecified occlusion or stenosis of left middle cerebral artery: Secondary | ICD-10-CM | POA: Insufficient documentation

## 2023-11-20 ENCOUNTER — Other Ambulatory Visit: Payer: Self-pay

## 2023-11-20 NOTE — Patient Instructions (Signed)
 [facility] 1st, 3rd Tuesdays of each month Once online, once at [employer]  For info: Harlene KIDD [phone]

## 2023-11-20 NOTE — Therapy (Signed)
 OUTPATIENT SPEECH LANGUAGE PATHOLOGY APHASIA EVALUATION   Patient Name: Henry Schneider MRN: [id number] DOB:Oct 16, 1955, 68 y.o., male Today's Date: 11/20/2023  PCP: Billy Craze, JONELLE REFERRING PROVIDER: Billy Craze JONELLE, NP  END OF SESSION:  End of Session - 11/20/23 1823     Visit Number 1    Number of Visits 17    Date for Recertification  01/16/24    SLP Start Time 1236    SLP Stop Time  1316    SLP Time Calculation (min) 40 min    Activity Tolerance Patient tolerated treatment well          Past Medical History:  Diagnosis Date   A-fib (HCC)    Acute ischemic left middle cerebral artery (MCA) stroke (HCC) 07/15/2023   aphasia   Aphasia due to acute cerebrovascular accident (CVA) (HCC)    Buerger's disease    Cardiac LV ejection fraction 21-30% 07/12/2023   June 2020, EF 25-30%. while in 2016, EF was only 15%   Chronic combined systolic and diastolic CHF (congestive heart failure) (HCC)    Congestive heart failure (CHF) (HCC)    Diabetes mellitus without complication (HCC)    Emphysema of lung (HCC)    Grade I diastolic dysfunction    Graves disease    Heart disease    Hyperlipidemia    Hypertension    ICD (implantable cardioverter-defibrillator) in place, battery check 09-15-23    s/p [facility] [employer] CRT-D placement   LBBB (left bundle branch block)    Non-ischemic cardiomyopathy (HCC)    NSVT (nonsustained ventricular tachycardia) (HCC)    NYHA Class III cardiovascular function (HCC)    Obesity (BMI 30-39.9)    Peripheral vascular disease    Pulmonary embolism (HCC)    History of pulmonary emboli while on Eliquis ; A-fib   Stroke (HCC)    Syncope, cardiogenic    Systolic congestive heart failure with reduced left ventricular function, NYHA class 3 (HCC)    Thyroid disease    Ventricular tachycardia [facility])    Past Surgical History:  Procedure Laterality Date   IR CT HEAD LTD  07/12/2023   IR PERCUTANEOUS ART THROMBECTOMY/INFUSION INTRACRANIAL  INC DIAG ANGIO  07/12/2023   IR US  GUIDE VASC ACCESS RIGHT  07/12/2023   RADIOLOGY WITH ANESTHESIA N/A 07/12/2023   Procedure: RADIOLOGY WITH ANESTHESIA;  Surgeon: Radiologist, Medication, MD;  Location: [facility] OR;  Service: Radiology;  Laterality: N/A;   Patient Active Problem List   Diagnosis Date Noted   Thyroid disease    Heart disease    Diabetes mellitus without complication (HCC)    Buerger's disease    Congestive heart failure (CHF) (HCC)    Emphysema of lung (HCC)    Hyperlipidemia    Hypertension    Peripheral vascular disease    A-fib (HCC)    Acute ischemic left MCA stroke (HCC) 07/15/2023   Stroke (HCC) 07/15/2023   Systolic heart failure (HCC) 12/20/2014   Pulmonary embolism (HCC) 12/19/2014    ONSET DATE: 07/12/23   REFERRING DIAG:  P36.487 (ICD-10-CM) - Acute ischemic left MCA stroke (HCC)    THERAPY DIAG:  Aphasia - Plan: SLP plan of care cert/re-cert  Rationale for Evaluation and Treatment: Rehabilitation  SUBJECTIVE:   SUBJECTIVE STATEMENT: I was there for two weeks (months). She saw me -she knew I was leaving - saw me three times a time. I don't have anyone to talk. (Pt lives alone) He indicates his speech was functional when he arrived home from [place], but in  the last two weeks it has declined.   Pt accompanied by: self  PERTINENT HISTORY: On 07/12/2023, he had an acute ischemic left MCA stroke. He presented to [facility] with aphasia and unable to provide history.  Code stroke was activated in the setting of aphasia last known well unknown.CTA head/neck showed acute proximal M3 stump occlusion, inferior division of the left MCA, otherwise negative. On 07/12/23 Dr. Ray performed cerebral angio of left M2/M3 thrombectomy with findings for relatively large transverse M3 branch perfusing the posterior left parietal lobe. Suction thrombectomy was performed x 3 with partial recanalization. No evidence of hemorrhage. MRI of brain acute left MCA territory  infarcts with trace right word midline shift. Eliquis  was resumed. Echo EF 25%-30%. Stroke felt to be cardioembolic versus LVO but hard to determine per Dr. Rosemarie.   He transferred to [facility] for ongoing PT, OT, and ST on 07/15/2023. He was suppose to follow up [facility] in 8 weeks or a neurologist in [place] . However, patient has not seen since his stroke. He was discharge from rehab on 07/18/2023. He was to continue Eliquis ; Atorvastatin  for hyperlipidemia; Valsartan and Metformin were not resumed; He reports for a short time until recently, he moved back up to [place]  to live with his brother.  While in [place] , patient got back in contact previous PCP who was able to set up speech therapy through home health. Patient has [employer] CRT Implant that has been interrogated to his previous cardiology on 09/15/2023.  Today, patient comes in alone. He is still having trouble finding some words to communicate, but for the most part able to understand him. Based on chart review and subjective data, he has only had speech therapy since his stroke. He reports he has moved back down to [place] and needs follow up with specialist.   PAIN:  Are you having pain? No  FALLS: Has patient fallen in last 6 months?  No  LIVING ENVIRONMENT: Lives with: lives alone Lives in: House/apartment  PLOF:  Level of assistance: Independent with ADLs, Independent with IADLs Employment: Retired  PATIENT GOALS: get better  OBJECTIVE:  Note: Objective measures were completed at Evaluation unless otherwise noted.  DIAGNOSTIC FINDINGS:  IMPRESSION: Acute left MCA territory infarcts with trace (1-2 mm) rightward midline shift.     Electronically Signed   By: Gilmore GORMAN Molt M.D.   On: 07/14/2023  COGNITION: Overall cognitive status: Within functional limits for tasks assessed  AUDITORY COMPREHENSION: Overall auditory comprehension: Impaired:  complex YES/NO questions: Impaired: complex Following directions: Impaired: complex Conversation: Complex Effective technique: extra processing time and slowed speech  READING COMPREHENSION: not tested today due to time  EXPRESSION: verbal  VERBAL EXPRESSION: Level of generative/spontaneous verbalization: conversation Automatic speech: name: intact, counting: intact, day of week: min extra time, and month of year: min extra time  Repetition: Impaired: sentence (8 words) Naming: Confrontation: 76-100%; divergent- named 3 items in 5 common categories without difficulty Pragmatics: Appears intact Comments: Pt's speech improved markedly as SLP and pt progressed through evaluation.  Interfering components: none Effective technique: open ended questions and providing extra time Non-verbal means of communication: gestures  WRITTEN EXPRESSION: Dominant hand: right Written expression: Impaired: word - pt demonstrated rare aphasic errors with writing words. HE wrote his name and address WNL  MOTOR SPEECH: Overall motor speech: Appears intact Respiration: thoracic breathing and diaphragmatic/abdominal breathing Phonation: normal Resonance: WFL Articulation: Appears intact Intelligibility: Intelligible Motor planning: Appears intact  ORAL MOTOR EXAMINATION: Overall  status: WFL  STANDARDIZED ASSESSMENTS: QAB: Mild (7.50-8.89) Summary      Word comprehension 8.75  Sentence comprehension 6.67  Word finding 6.33  Grammatical construction 6.75  Speech motor programming 10.00  Repetition 8.33  Reading 9.17  QAB overall 7.54    PATIENT REPORTED OUTCOME MEASURES (PROM): Communication Effectiveness Survey: provided in first session                                                                                                                             TREATMENT DATE:   11/19/23: SLP shared information about [facility] aphasia group, tasks he could accomplish at home to work on his  language, and stressed he will need to find people to talk with to practice (see s).SLP discussed pt's options with him re: therapy. He was feeling better about his speech fluidity by the end of the evaluation. He decided he did not need to return to therapy. SLP confirmed this, telling him it may benefit him in the long term and told him SLP will leave chart open for 2 weeks in case he changes his mind.    PATIENT EDUCATION: Education details: see Treatment date Person educated: Patient Education method: Explanation, Demonstration, and Handouts Education comprehension: verbalized understanding and returned demonstration   GOALS: Goals reviewed with patient? No  SHORT TERM GOALS: Target date: 12/23/23  Pt will name 8 items in mod complex category in 2 sessions Baseline: Goal status: INITIAL  2.  Pt will incr MLU to 8.0 in 5 minute language sample Baseline:  Goal status: INITIAL  3.  Pt will describe objects functionally with rare min A in two sessions Baseline:  Goal status: INITIAL  4.  Pt will complete SFA and VNeST with rare min A Baseline:  Goal status: INITIAL   LONG TERM GOALS: Target date: 01/16/24  Pt will improve pROM Baseline:  Goal status: INITIAL  2.  Pt will improve MLU to 9.0 in 5 minute speech sample  Baseline:  Goal status: INITIAL  3.  Pt will complete SFA and VNeST with mod I in 3 sessions Baseline:  Goal status: INITIAL   ASSESSMENT:  CLINICAL IMPRESSION: Patient is a 68 y.o. M who was seen today for assessment of language following CVA in June 2025. He presents today with mild aphasia, likely mod in more complex topics. Language reception is better than expression. He had between 15 and 18 sessions of ST in [place] , and then Marcey indicated he moved back home to [facility] when he and family agreed his speech was functional enough to do so.  Today his speech improved as he progressed through the evaluation and by the end of the eval he was  certainly functional. See treatment date for details about ST after QAB/eval concluded.  OBJECTIVE IMPAIRMENTS: include aphasia. These impairments are limiting patient from managing medications, managing appointments, managing finances, household responsibilities, ADLs/IADLs, and effectively communicating at home and in community. Factors affecting potential to achieve goals  and functional outcome are family/community support. Patient will benefit from skilled SLP services to address above impairments and improve overall function.  REHAB POTENTIAL: Good  PLAN:  SLP FREQUENCY: 1-2x/week  SLP DURATION: 8 weeks  PLANNED INTERVENTIONS: Language facilitation, Environmental controls, Cueing hierachy, Internal/external aids, Functional tasks, Multimodal communication approach, SLP instruction and feedback, Compensatory strategies, Patient/family education, and [id number] Treatment of speech (30 or 45 min)     Keyshia Orwick, CCC-SLP 11/20/2023, 6:24 PM

## 2023-11-28 ENCOUNTER — Telehealth: Payer: Self-pay | Admitting: Family Medicine

## 2023-11-28 ENCOUNTER — Ambulatory Visit: Admission: RE | Admit: 2023-11-28 | Source: Ambulatory Visit

## 2023-11-28 NOTE — Telephone Encounter (Signed)
 Copied from CRM #[id number]. Topic: Referral - Status >> Nov 28, 2023  1:25 PM Franky GRADE wrote: Reason for CRM: Patient is calling to follow up on the status of a referral to Endocrinology. He has not heard back to schedule.

## 2023-12-01 ENCOUNTER — Other Ambulatory Visit

## 2023-12-01 DIAGNOSIS — Z716 Tobacco abuse counseling: Secondary | ICD-10-CM

## 2023-12-03 ENCOUNTER — Ambulatory Visit
Admission: RE | Admit: 2023-12-03 | Discharge: 2023-12-03 | Disposition: A | Discharge status: Home / Self Care | Source: Ambulatory Visit | Attending: Acute Care | Admitting: Acute Care

## 2023-12-03 DIAGNOSIS — Z87891 Personal history of nicotine dependence: Secondary | ICD-10-CM | POA: Insufficient documentation

## 2023-12-03 DIAGNOSIS — I517 Cardiomegaly: Secondary | ICD-10-CM | POA: Insufficient documentation

## 2023-12-03 DIAGNOSIS — I7090 Unspecified atherosclerosis: Secondary | ICD-10-CM | POA: Insufficient documentation

## 2023-12-03 DIAGNOSIS — R918 Other nonspecific abnormal finding of lung field: Secondary | ICD-10-CM | POA: Insufficient documentation

## 2023-12-03 DIAGNOSIS — Z122 Encounter for screening for malignant neoplasm of respiratory organs: Secondary | ICD-10-CM | POA: Diagnosis not present

## 2023-12-03 DIAGNOSIS — R911 Solitary pulmonary nodule: Secondary | ICD-10-CM | POA: Insufficient documentation

## 2023-12-03 DIAGNOSIS — N2889 Other specified disorders of kidney and ureter: Secondary | ICD-10-CM | POA: Insufficient documentation

## 2023-12-03 LAB — GLUCOSE, CAPILLARY: Glucose-Capillary: 84 mg/dL (ref 70–99)

## 2023-12-03 MED ORDER — FLUDEOXYGLUCOSE F - 18 (FDG) INJECTION
11.8300 | Freq: Once | INTRAVENOUS | Status: AC | PRN
  Administered 2023-12-03: 11.83 via INTRAVENOUS

## 2023-12-03 MED ORDER — BUPROPION HCL ER (SR) 150 MG PO TB12: ORAL_TABLET | ORAL | 0 refills | Status: DC

## 2023-12-03 MED ORDER — NICOTINE POLACRILEX 4 MG MT GUM: CHEWING_GUM | OROMUCOSAL | 0 refills | Status: DC

## 2023-12-03 NOTE — Progress Notes (Signed)
 12/03/2023 Name: Henry Schneider MRN: [id number] DOB: 11-Oct-1955  Chief Complaint  Patient presents with   Medication Management    Smoking Cessation    Henry Schneider is a 68 y.o. year old male who was referred for medication management by their primary care provider, Billy Philippe SAUNDERS, NP. They presented for a face to face visit today.   They were referred to the pharmacist by their PCP for assistance in managing smoking cessation    Subjective:  Care Team: Primary Care Provider: Billy Philippe SAUNDERS, NP    Medication Access/Adherence  Current Pharmacy:  [facility] [phone] [address], [place] - [address] AT [facility] [address] [place] [address] Phone: [phone] Fax: [phone]  [facility] [address] [place] [address] Phone: [phone] Fax: [phone]   Patient reports affordability concerns with their medications: No  Patient reports access/transportation concerns to their pharmacy: No  Patient reports adherence concerns with their medications:  No     Tobacco Abuse:  Tobacco Use History: Been smoking for 40 years Number of cigarettes per day: 1 pack per day (reports use to be 2 packs) Smokes first cigarette within 30 minutes after waking Triggers include driving in car and being in the habit  Quit Attempt History: Longest time ever been tobacco free 6 months Methods tried in the past include stopping cold turkey, using nicotine gum, and chantix. Reports the chantix did not help at all Cannot use nicotine patches due to Buerger's disease   Objective:  Lab Results  Component Value Date   HGBA1C 6.7 (H) 10/28/2023    Lab Results  Component Value Date   CREATININE 1.10 10/28/2023   BUN 24 (H) 10/28/2023   NA 141 10/28/2023   K 4.3 10/28/2023   CL 107 10/28/2023   CO2 26 10/28/2023    Lab Results  Component Value Date   CHOL 111 10/28/2023   HDL 47.50 10/28/2023    LDLCALC 52 10/28/2023   TRIG 62.0 10/28/2023   CHOLHDL 2 10/28/2023    Medications Reviewed Today     Reviewed by Lionell Jon DEL, RPH (Pharmacist) on 12/03/23 at 0805  Med List Status: <None>   Medication Order Taking? Sig Documenting Provider Last Dose Status Informant  apixaban  (ELIQUIS ) 5 MG TABS tablet [id number]  Take 1 tablet (5 mg total) by mouth 2 (two) times daily. Jerilynn Daphne SAILOR, NP  Active   atorvastatin  (LIPITOR) 40 MG tablet [id number]  Take 1 tablet (40 mg total) by mouth daily. Jerilynn Daphne SAILOR, NP  Active   buPROPion (WELLBUTRIN SR) 150 MG 12 hr tablet [id number] Yes Take 1 tablet by mouth daily for 3 days, then increase to 1 tablet by mouth twice daily Williamson, Joanna R, NP  Active   dapagliflozin  propanediol (FARXIGA ) 10 MG TABS tablet [id number]  Take 1 tablet (10 mg total) by mouth daily. Jerilynn Daphne SAILOR, NP  Active   furosemide  (LASIX ) 20 MG tablet [id number]  Take 1 tablet (20 mg total) by mouth daily. Jerilynn Daphne SAILOR, NP  Active   methimazole (TAPAZOLE) 5 MG tablet [id number]  Take 1 tablet (5 mg total) by mouth daily. Williamson, Joanna R, NP  Active   metoprolol  succinate (TOPROL -XL) 50 MG 24 hr tablet [id number]  Take 2 tablets (100mg ) by mouth in the morning and 1 tablet (50mg ) in the afternoon. Jerilynn Daphne SAILOR, NP  Active   spironolactone  (ALDACTONE ) 25 MG tablet [id number]  Take 0.5 tablets (12.5 mg  total) by mouth daily. Jerilynn Daphne SAILOR, NP  Active               Assessment/Plan:   Tobacco Abuse - Currently uncontrolled - Provided motivational interviewing to assess tobacco use and strategies for reduction - Provided information on 1 800 QUIT NOW support program - START Bupropion SR 150mg  and use nicotine 4mg  gum as needed for breakthrough cravings - Counseled patient on quit techniques, patient prefers to try to stop all cigarettes completely instead of trying to taper down -Counseled on techniques to help curb cravings such as avoiding  triggers and keeping mind busy  Follow Up Plan: 2 weeks  Jon VEAR [place]au, PharmD Clinical Pharmacist [phone]

## 2023-12-09 ENCOUNTER — Encounter: Payer: Self-pay | Admitting: Cardiology

## 2023-12-09 ENCOUNTER — Ambulatory Visit: Attending: Cardiology | Admitting: Cardiology

## 2023-12-09 VITALS — BP 114/68 | HR 68 | Ht 75.0 in | Wt 278.0 lb

## 2023-12-09 DIAGNOSIS — I472 Ventricular tachycardia, unspecified: Secondary | ICD-10-CM | POA: Diagnosis not present

## 2023-12-09 DIAGNOSIS — I4891 Unspecified atrial fibrillation: Secondary | ICD-10-CM | POA: Diagnosis not present

## 2023-12-09 DIAGNOSIS — I509 Heart failure, unspecified: Secondary | ICD-10-CM | POA: Insufficient documentation

## 2023-12-09 DIAGNOSIS — D6869 Other thrombophilia: Secondary | ICD-10-CM | POA: Insufficient documentation

## 2023-12-09 LAB — CUP PACEART INCLINIC DEVICE CHECK
Date Time Interrogation Session: 20251111165153
HighPow Impedance: 60 Ohm
HighPow Impedance: 78 Ohm
Implantable Lead Connection Status: 753985
Implantable Lead Connection Status: 753985
Implantable Lead Connection Status: 753985
Implantable Lead Implant Date: 20170503
Implantable Lead Implant Date: 20170503
Implantable Lead Implant Date: 20170503
Implantable Lead Location: 753858
Implantable Lead Location: 753859
Implantable Lead Location: 753862
Implantable Lead Model: 292
Implantable Lead Model: 4674
Implantable Lead Model: 7741
Implantable Lead Serial Number: 399904
Implantable Lead Serial Number: 524769
Implantable Lead Serial Number: 740627
Implantable Pulse Generator Implant Date: 20170503
Lead Channel Impedance Value: 381 Ohm
Lead Channel Impedance Value: 591 Ohm
Lead Channel Impedance Value: 903 Ohm
Lead Channel Pacing Threshold Amplitude: 0.4 V
Lead Channel Pacing Threshold Amplitude: 0.7 V
Lead Channel Pacing Threshold Amplitude: 0.9 V
Lead Channel Pacing Threshold Pulse Width: 0.5 ms
Lead Channel Pacing Threshold Pulse Width: 0.5 ms
Lead Channel Pacing Threshold Pulse Width: 0.5 ms
Lead Channel Sensing Intrinsic Amplitude: 6.7 mV
Lead Channel Sensing Intrinsic Amplitude: 7.1 mV
Lead Channel Sensing Intrinsic Amplitude: 9.6 mV
Lead Channel Setting Pacing Amplitude: 2 V
Lead Channel Setting Pacing Amplitude: 2 V
Lead Channel Setting Pacing Amplitude: 2 V
Lead Channel Setting Pacing Pulse Width: 0.5 ms
Lead Channel Setting Pacing Pulse Width: 0.5 ms
Lead Channel Setting Sensing Sensitivity: 0.6 mV
Lead Channel Setting Sensing Sensitivity: 1 mV
Pulse Gen Serial Number: 159875
Zone Setting Status: 755011

## 2023-12-09 NOTE — Progress Notes (Signed)
 Electrophysiology Office Note:   Date:  12/09/2023  ID:  Henry Schneider, DOB 31-Oct-1955, MRN [id number]  Primary Cardiologist: None Primary Heart Failure: None Electrophysiologist: Ahmaud Duthie Gladis Norton, MD      History of Present Illness:   Henry Schneider is a 68 y.o. male with h/o chronic systolic heart failure due to ischemic cardiomyopathy, hypertension,, atrial fibrillation, hyperlipidemia, PVD, DVT, PE, Buerger's disease, emphysema, mitral regurgitation seen today for  for Electrophysiology evaluation of ICD at the request of Philippe Slade.    His defibrillator was implanted in 2017 at an outside facility.  He had admission April 2021 with syncope.  He was found to have paced terminated VT.  Echo at the time showed an ejection fraction of 30 to 35%.  He has continued to have VT episodes that have fallen into the VT monitor zone.  As well as an episode following into the VF zone requiring ICD shock that was not converted with ATP.  He also has atrial fibrillation and is now on Eliquis .  Atrial fibrillation was diagnosed December 2022.  Discussed the use of AI scribe software for clinical note transcription with the patient, who gave verbal consent to proceed.  History of Present Illness Henry Schneider is a 68 year old male with atrial fibrillation who presents for follow-up after a recent stroke.  In mid-June, he experienced a stroke at his home in [place], [place] . He felt 'funny' and went to the emergency room, where he was diagnosed with a stroke. His speech was significantly affected, and he stated 'I couldn't even talk.' He is awaiting an appointment with a neurologist for further evaluation. His speech remains affected following the stroke.  He has a history of atrial fibrillation and was prescribed Eliquis , which he missed doses of around the time of the stroke. He has previously been shocked by his defibrillator, with the last occurrence being about a year ago. He is on  a regimen of medications for his heart condition, but he did not recognize the names amiodarone or mexiletine when asked.  He experiences shortness of breath and has an upcoming appointment at a cancer center. He has a significant smoking history of 40 years, and has a new pleural mass noted on recent CT scan.   Review of systems complete and found to be negative unless listed in HPI.      EP Information / Studies Reviewed:    EKG is ordered today. Personal review as below.  EKG Interpretation Date/Time:  Tuesday December 09 2023 15:30:31 EST Ventricular Rate:  68 PR Interval:  166 QRS Duration:  112 QT Interval:  402 QTC Calculation: 427 R Axis:   -79  Text Interpretation: Atrial-sensed ventricular-paced rhythm with occasional AV dual-paced complexes and with frequent Premature ventricular complexes Biventricular pacemaker detected When compared with ECG of 11-Jul-2023 23:04, No significant change since last tracing Confirmed by Hanley Rispoli ([id number]) on 12/09/2023 3:45:24 PM   ICD Interrogation-  reviewed in detail today,  See PACEART report.  Device History: [employer] BiV ICD implanted 2017 for chronic systolic heart failure History of appropriate therapy: Yes History of AAD therapy: No   Risk Assessment/Calculations:    CHA2DS2-VASc Score =     This indicates a  % annual risk of stroke. The patient's score is based upon:               Physical Exam:   VS:  BP 114/68 (BP Location: Left Arm, Patient Position: Sitting, Cuff Size: Large)   Pulse  68   Ht 6' 3 (1.905 m)   Wt 278 lb (126.1 kg)   SpO2 96%   BMI 34.75 kg/m    Wt Readings from Last 3 Encounters:  12/09/23 278 lb (126.1 kg)  11/11/23 274 lb (124.3 kg)  10/28/23 276 lb (125.2 kg)     GEN: Well nourished, well developed in no acute distress NECK: No JVD; No carotid bruits CARDIAC: Regular rate and rhythm, no murmurs, rubs, gallops RESPIRATORY:  Clear to auscultation without rales, wheezing or  rhonchi  ABDOMEN: Soft, non-tender, non-distended EXTREMITIES:  No edema; No deformity   ASSESSMENT AND PLAN:    Chronic systolic dysfunction s/p [employer] CRT-D  euvolemic today Stable on an appropriate medical regimen Normal ICD function See Pace Art report No changes today   2.  Paroxysmal atrial fibrillation: Minimal noted on device interrogation  3.  Secondary hypercoagulable date: On Eliquis   4.  PVCs/ventricular tachycardia: Has had multiple episodes.  Some have been treated with ATP some with ICD shock.  Continues to have PVCs and nonsustained VT.  If he receives another ICD shocks, Freada Twersky likely need antiarrhythmic therapy.  Disposition:   Follow up with EP Team in 6 months   Signed, Gwendolyn Mclees Gladis Norton, MD

## 2023-12-09 NOTE — Patient Instructions (Signed)
 Medication Instructions:  Your physician recommends that you continue on your current medications as directed. Please refer to the Current Medication list given to you today.  *If you need a refill on your cardiac medications before your next appointment, please call your pharmacy*  Lab Work: None ordered.  If you have labs (blood work) drawn today and your tests are completely normal, you will receive your results only by: MyChart Message (if you have MyChart) OR A paper copy in the mail If you have any lab test that is abnormal or we need to change your treatment, we will call you to review the results.  Testing/Procedures: None ordered.   Follow-Up: At [facility], you and your health needs are our priority.  As part of our continuing mission to provide you with exceptional heart care, our providers are all part of one team.  This team includes your primary Cardiologist (physician) and Advanced Practice Providers or APPs (Physician Assistants and Nurse Practitioners) who all work together to provide you with the care you need, when you need it.  Your next appointment:   6 months with EP APP

## 2023-12-10 ENCOUNTER — Other Ambulatory Visit

## 2023-12-10 DIAGNOSIS — Z716 Tobacco abuse counseling: Secondary | ICD-10-CM

## 2023-12-10 NOTE — Progress Notes (Signed)
 12/10/2023 Name: Henry Schneider MRN: [id number] DOB: [birth year]  Chief Complaint  Patient presents with   Medication Management    Smoking Cessation    Henry Schneider is a 68 y.o. year old male who was referred for medication management by their primary care provider, Billy Philippe SAUNDERS, NP. They presented for a telephone today.   They were referred to the pharmacist by their PCP for assistance in managing smoking cessation    Subjective:  Care Team: Primary Care Provider: Billy Philippe SAUNDERS, NP    Medication Access/Adherence  Current Pharmacy:  [facility] [phone] [address] [place], [place] - [address] AT [facility] [address] [place] [address] Phone: [phone] Fax: [phone]  [facility] [address] [place] [address] Phone: [phone] Fax: [phone]   Patient reports affordability concerns with their medications: No  Patient reports access/transportation concerns to their pharmacy: No  Patient reports adherence concerns with their medications:  No     Tobacco Abuse: Current Med Therapy: Bupropion SR 150mg   Tobacco Use History: Been smoking for 40 years Number of cigarettes per day: 1 pack per day (reports use to be 2 packs) Smokes first cigarette within 30 minutes after waking Triggers include driving in car and being in the habit  Quit Attempt History: Longest time ever been tobacco free 6 months Methods tried in the past include stopping cold turkey, using nicotine gum, and chantix. 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 Does not plan to purchase more. Has been using Hall cough drops for cravings and found  this to be helpful   Objective:  Lab Results  Component Value Date   HGBA1C 6.7 (H) 10/28/2023    Lab Results  Component Value Date   CREATININE 1.10 10/28/2023   BUN 24 (H) 10/28/2023   NA 141 10/28/2023   K 4.3 10/28/2023   CL 107 10/28/2023   CO2 26 10/28/2023    Lab Results  Component Value Date   CHOL 111 10/28/2023   HDL 47.50 10/28/2023   LDLCALC 52 10/28/2023   TRIG 62.0 10/28/2023   CHOLHDL 2 10/28/2023    Medications Reviewed Today     Reviewed by Lionell Jon DEL, RPH (Pharmacist) on 12/10/23 at 1021  Med List Status: <None>   Medication Order Taking? Sig Documenting Provider Last Dose Status Informant  apixaban  (ELIQUIS ) 5 MG TABS tablet [id number]  Take 1 tablet (5 mg total) by mouth 2 (two) times daily. Jerilynn Daphne SAILOR, NP  Active   atorvastatin  (LIPITOR) 40 MG tablet [id number]  Take 1 tablet (40 mg total) by mouth daily. Jerilynn Daphne SAILOR, NP  Active   buPROPion (WELLBUTRIN SR) 150 MG 12 hr tablet [id number]  Take 1 tablet by mouth daily for 3 days, then increase to 1 tablet by mouth twice daily Williamson, Joanna R, NP  Active   dapagliflozin  propanediol (FARXIGA ) 10 MG TABS tablet [id number]  Take 1 tablet (10 mg total) by mouth daily. Jerilynn Daphne SAILOR, NP  Active   furosemide  (LASIX ) 20 MG tablet [id number]  Take 1 tablet (20 mg total) by mouth daily. Jerilynn Daphne SAILOR, NP  Active   methimazole (TAPAZOLE)  5 MG tablet [id number]  Take 1 tablet (5 mg total) by mouth daily. Williamson, Joanna R, NP  Active   metoprolol  succinate (TOPROL -XL) 50 MG 24 hr tablet [id number]  Take 2 tablets (100mg ) by mouth in the morning and 1 tablet (50mg ) in the afternoon. Jerilynn Daphne SAILOR, NP  Active   nicotine polacrilex ([facility] NICOTINE) 4 MG gum [id number]  Chew 1 piece of gum every 1-2 hours as needed per package instructions, max 24 pieces/day Billy Philippe SAUNDERS, NP  Active   spironolactone  (ALDACTONE ) 25 MG tablet [id number]  Take 0.5 tablets (12.5 mg total) by mouth  daily. Jerilynn Daphne SAILOR, NP  Active               Assessment/Plan:   Tobacco Abuse - Currently uncontrolled - Provided motivational interviewing to assess tobacco use and strategies for reduction - Provided information on 1 800 QUIT NOW support program - Continue Bupropion SR 150mg  and reminded to use nicotine 4mg  gum as needed for breakthrough cravings if cough drops are not enough - Counseled patient on quit techniques, patient still prefers to try to stop all cigarettes completely instead of trying to taper down -Counseled on techniques to help curb cravings such as avoiding triggers and keeping mind busy  Follow Up Plan: 1 week  Jon VEAR Lindau, PharmD Clinical Pharmacist [phone]

## 2023-12-11 ENCOUNTER — Inpatient Hospital Stay

## 2023-12-11 ENCOUNTER — Inpatient Hospital Stay: Attending: Nurse Practitioner | Admitting: Nurse Practitioner

## 2023-12-11 VITALS — BP 110/60 | HR 77 | Temp 97.5°F | Resp 17 | Wt 279.3 lb

## 2023-12-11 DIAGNOSIS — N2889 Other specified disorders of kidney and ureter: Secondary | ICD-10-CM

## 2023-12-11 DIAGNOSIS — I4891 Unspecified atrial fibrillation: Secondary | ICD-10-CM | POA: Insufficient documentation

## 2023-12-11 DIAGNOSIS — R918 Other nonspecific abnormal finding of lung field: Secondary | ICD-10-CM | POA: Insufficient documentation

## 2023-12-11 DIAGNOSIS — Z950 Presence of cardiac pacemaker: Secondary | ICD-10-CM | POA: Insufficient documentation

## 2023-12-11 DIAGNOSIS — Z87891 Personal history of nicotine dependence: Secondary | ICD-10-CM | POA: Insufficient documentation

## 2023-12-11 DIAGNOSIS — R9389 Abnormal findings on diagnostic imaging of other specified body structures: Secondary | ICD-10-CM | POA: Insufficient documentation

## 2023-12-11 DIAGNOSIS — Z8052 Family history of malignant neoplasm of bladder: Secondary | ICD-10-CM | POA: Insufficient documentation

## 2023-12-11 DIAGNOSIS — Z7901 Long term (current) use of anticoagulants: Secondary | ICD-10-CM | POA: Insufficient documentation

## 2023-12-11 DIAGNOSIS — Z8673 Personal history of transient ischemic attack (TIA), and cerebral infarction without residual deficits: Secondary | ICD-10-CM | POA: Insufficient documentation

## 2023-12-11 DIAGNOSIS — R942 Abnormal results of pulmonary function studies: Secondary | ICD-10-CM | POA: Insufficient documentation

## 2023-12-11 LAB — CMP (CANCER CENTER ONLY)
ALT: 38 U/L (ref 0–44)
AST: 33 U/L (ref 15–41)
Albumin: 3.9 g/dL (ref 3.5–5.0)
Alkaline Phosphatase: 89 U/L (ref 38–126)
Anion gap: 8 (ref 5–15)
BUN: 26 mg/dL — ABNORMAL HIGH (ref 8–23)
CO2: 28 mmol/L (ref 22–32)
Calcium: 9.8 mg/dL (ref 8.9–10.3)
Chloride: 104 mmol/L (ref 98–111)
Creatinine: 1.12 mg/dL (ref 0.61–1.24)
GFR, Estimated: >60 mL/min (ref >60)
Glucose, Bld: 98 mg/dL (ref 70–99)
Potassium: 5 mmol/L (ref 3.5–5.1)
Sodium: 141 mmol/L (ref 135–145)
Total Bilirubin: 0.7 mg/dL (ref 0.0–1.2)
Total Protein: 7 g/dL (ref 6.5–8.1)

## 2023-12-11 LAB — CBC WITH DIFFERENTIAL (CANCER CENTER ONLY)
Abs Immature Granulocytes: 0.05 K/uL (ref 0.00–0.07)
Basophils Absolute: 0.1 K/uL (ref 0.0–0.1)
Basophils Relative: 1 %
Eosinophils Absolute: 0.1 K/uL (ref 0.0–0.5)
Eosinophils Relative: 1 %
HCT: 52.6 % — ABNORMAL HIGH (ref 39.0–52.0)
Hemoglobin: 17.2 g/dL — ABNORMAL HIGH (ref 13.0–17.0)
Immature Granulocytes: 0 %
Lymphocytes Relative: 19 %
Lymphs Abs: 2.4 K/uL (ref 0.7–4.0)
MCH: 31 pg (ref 26.0–34.0)
MCHC: 32.7 g/dL (ref 30.0–36.0)
MCV: 94.8 fL (ref 80.0–100.0)
Monocytes Absolute: 1.1 K/uL — ABNORMAL HIGH (ref 0.1–1.0)
Monocytes Relative: 8 %
Neutro Abs: 9 K/uL — ABNORMAL HIGH (ref 1.7–7.7)
Neutrophils Relative %: 71 %
Platelet Count: 172 K/uL (ref 150–400)
RBC: 5.55 MIL/uL (ref 4.22–5.81)
RDW: 14 % (ref 11.5–15.5)
WBC Count: 12.7 K/uL — ABNORMAL HIGH (ref 4.0–10.5)
nRBC: 0 % (ref 0.0–0.2)

## 2023-12-11 NOTE — Progress Notes (Unsigned)
 [facility]  [phone]   HEMATOLOGY ONCOLOGY INPATIENT CONSULTATION   Henry Schneider  DOB: 09-05-1955  MR#: [id number]  CSN#: [id number]    Requesting Physician: Philippe Slade, NP  Patient Care Team: Slade Philippe SAUNDERS, NP as PCP - General (Family Medicine) Inocencio Soyla Lunger, MD as PCP - Electrophysiology (Cardiology) Lionell Jon DEL, [facility] (Pharmacist)  Reason for consult: Neutropenia; right renal mass; left pleural mass  History of present illness:     MEDICAL HISTORY:  Past Medical History:  Diagnosis Date   A-fib [facility])    Acute ischemic left middle cerebral artery (MCA) stroke (HCC) 07/15/2023   aphasia   Aphasia due to acute cerebrovascular accident (CVA) (HCC)    Buerger's disease    Cardiac LV ejection fraction 21-30% 07/12/2023   June 2020, EF 25-30%. while in 2016, EF was only 15%   Chronic combined systolic and diastolic CHF (congestive heart failure) (HCC)    Congestive heart failure (CHF) (HCC)    Diabetes mellitus without complication (HCC)    Emphysema of lung (HCC)    Grade I diastolic dysfunction    Graves disease    Heart disease    Hyperlipidemia    Hypertension    ICD (implantable cardioverter-defibrillator) in place, battery check 09-15-23    s/p [facility] Scientific CRT-D placement   LBBB (left bundle branch block)    Non-ischemic cardiomyopathy (HCC)    NSVT (nonsustained ventricular tachycardia) (HCC)    NYHA Class III cardiovascular function (HCC)    Obesity (BMI 30-39.9)    Peripheral vascular disease    Pulmonary embolism (HCC)    History of pulmonary emboli while on Eliquis ; A-fib   Stroke (HCC)    Syncope, cardiogenic    Systolic congestive heart failure with reduced left ventricular function, NYHA class 3 (HCC)    Thyroid disease    Ventricular tachycardia (HCC)     SURGICAL HISTORY: Past Surgical History:  Procedure Laterality Date   IR CT HEAD LTD  07/12/2023   IR PERCUTANEOUS ART  THROMBECTOMY/INFUSION INTRACRANIAL INC DIAG ANGIO  07/12/2023   IR US  GUIDE VASC ACCESS RIGHT  07/12/2023   RADIOLOGY WITH ANESTHESIA N/A 07/12/2023   Procedure: RADIOLOGY WITH ANESTHESIA;  Surgeon: Radiologist, Medication, MD;  Location: [facility] OR;  Service: Radiology;  Laterality: N/A;    SOCIAL HISTORY: Social History   Socioeconomic History   Marital status: Single    Spouse name: Not on file   Number of children: 0   Years of education: Not on file   Highest education level: High school graduate  Occupational History   Not on file  Tobacco Use   Smoking status: Every Day    Current packs/day: 1.00    Types: Cigarettes   Smokeless tobacco: Not on file  Vaping Use   Vaping status: Never Used  Substance and Sexual Activity   Alcohol use: Not Currently    Comment: Once a month   Drug use: Yes    Comment: CBD gummies   Sexual activity: Yes  Other Topics Concern   Not on file  Social History Narrative   Not on file   Social Drivers of Health   Financial Resource Strain: Low Risk  (10/28/2023)   Overall Financial Resource Strain (CARDIA)    Difficulty of Paying Living Expenses: Not hard at all  Food Insecurity: No Food Insecurity (10/28/2023)   Hunger Vital Sign    Worried About Running Out of Food in the Last Year: Never true  Ran Out of Food in the Last Year: Never true  Transportation Needs: No Transportation Needs (10/28/2023)   PRAPARE - Administrator, Civil Service (Medical): No    Lack of Transportation (Non-Medical): No  Physical Activity: Sufficiently Active (10/28/2023)   Exercise Vital Sign    Days of Exercise per Week: 6 days    Minutes of Exercise per Session: 50 min  Stress: No Stress Concern Present (10/28/2023)   [employer] of Occupational Health - Occupational Stress Questionnaire    Feeling of Stress: Not at all  Social Connections: Socially Isolated (10/28/2023)   Social Connection and Isolation Panel    Frequency of Communication with  Friends and Family: Never    Frequency of Social Gatherings with Friends and Family: Never    Attends Religious Services: Never    Database Administrator or Organizations: Yes    Attends Banker Meetings: Never    Marital Status: Never married  Intimate Partner Violence: Not At Risk (10/28/2023)   Humiliation, Afraid, Rape, and Kick questionnaire    Fear of Current or Ex-Partner: No    Emotionally Abused: No    Physically Abused: No    Sexually Abused: No    FAMILY HISTORY: Family History  Problem Relation Age of Onset   Coronary artery disease Mother    Coronary artery disease Father    Cancer Brother     ALLERGIES:  has no known allergies.  MEDICATIONS:  Current Outpatient Medications  Medication Sig Dispense Refill   apixaban  (ELIQUIS ) 5 MG TABS tablet Take 1 tablet (5 mg total) by mouth 2 (two) times daily. 60 tablet 0   atorvastatin  (LIPITOR) 40 MG tablet Take 1 tablet (40 mg total) by mouth daily. 30 tablet 0   buPROPion (WELLBUTRIN SR) 150 MG 12 hr tablet Take 1 tablet by mouth daily for 3 days, then increase to 1 tablet by mouth twice daily 60 tablet 0   dapagliflozin  propanediol (FARXIGA ) 10 MG TABS tablet Take 1 tablet (10 mg total) by mouth daily. 30 tablet 0   furosemide  (LASIX ) 20 MG tablet Take 1 tablet (20 mg total) by mouth daily. 30 tablet 0   methimazole (TAPAZOLE) 5 MG tablet Take 1 tablet (5 mg total) by mouth daily. 90 tablet 0   metoprolol  succinate (TOPROL -XL) 50 MG 24 hr tablet Take 2 tablets (100mg ) by mouth in the morning and 1 tablet (50mg ) in the afternoon. 30 tablet 0   nicotine polacrilex ([facility] NICOTINE) 4 MG gum Chew 1 piece of gum every 1-2 hours as needed per package instructions, max 24 pieces/day 100 tablet 0   spironolactone  (ALDACTONE ) 25 MG tablet Take 0.5 tablets (12.5 mg total) by mouth daily. 30 tablet 0   No current facility-administered medications for this visit.    REVIEW OF SYSTEMS:   Constitutional: Denies fevers,  chills or abnormal night sweats Eyes: Denies blurriness of vision, double vision or watery eyes Ears, nose, mouth, throat, and face: Denies mucositis or sore throat Respiratory: Denies cough, dyspnea or wheezes Cardiovascular: Denies palpitation, chest discomfort or lower extremity swelling Gastrointestinal:  Denies nausea, heartburn or change in bowel habits Skin: Denies abnormal skin rashes Lymphatics: Denies new lymphadenopathy or easy bruising Neurological:Denies numbness, tingling or new weaknesses Behavioral/Psych: Mood is stable, no new changes  All other systems were reviewed with the patient and are negative.  PHYSICAL EXAMINATION: ECOG PERFORMANCE STATUS: {CHL ONC ECOG PS:[phone]}  There were no vitals filed for this visit. There were no  vitals filed for this visit.  GENERAL:alert, no distress and comfortable SKIN: skin color, texture, turgor are normal, no rashes or significant lesions EYES: normal, conjunctiva are pink and non-injected, sclera clear OROPHARYNX:no exudate, no erythema and lips, buccal mucosa, and tongue normal  NECK: supple, thyroid normal size, non-tender, without nodularity LYMPH:  no palpable lymphadenopathy in the cervical, axillary or inguinal LUNGS: clear to auscultation and percussion with normal breathing effort HEART: regular rate & rhythm and no murmurs and no lower extremity edema ABDOMEN:abdomen soft, non-tender and normal bowel sounds Musculoskeletal:no cyanosis of digits and no clubbing  PSYCH: alert & oriented x 3 with fluent speech NEURO: no focal motor/sensory deficits  LABORATORY DATA:  I have reviewed the data as listed Lab Results  Component Value Date   WBC 11.8 (H) 11/11/2023   HGB 17.1 (H) 11/11/2023   HCT 52.6 (H) 11/11/2023   MCV 94.6 11/11/2023   PLT 177.0 11/11/2023   Recent Labs    07/11/23 2240 07/16/23 0508 07/17/23 0459 10/28/23 0914  NA 138 138 139 141  K 4.3 4.2 4.4 4.3  CL 110 111 111 107  CO2 21* 19*  25 26  GLUCOSE 106* 112* 98 107*  BUN 27* 15 13 24*  CREATININE 1.13 0.90 1.12 1.10  CALCIUM  9.0 8.7* 8.8* 9.8  GFRNONAA >60 >60 >60  --   PROT 6.9 5.8*  --  6.8  ALBUMIN 3.9 3.0*  --  4.1  AST 20 21  --  15  ALT 24 21  --  21  ALKPHOS 60 58  --  87  BILITOT 1.3* 1.0  --  1.2    RADIOGRAPHIC STUDIES: I have personally reviewed the radiological images as listed and agreed with the findings in the report. CUP PACEART INCLINIC DEVICE CHECK Result Date: 12/09/2023 Normal in-clinic CRT-D (multi-lead) check. Presenting Rhythm: AS/BP 73 w/ PVC's. Routine testing was performed. Thresholds, sensing, impedance trend were stable and no changes were required. HF diagnostics are stable. No treated arrhythmias. Brief NSVT episodes noted longest duration 14 seconds. Patient BiV pacing 92% of the time. Estimated longevity 2 years. Pt enrolled in remote follow-up. Patient has GORE lead. Impedance stable at this time. Patient educated and received magnet for shock plan containing device clinic number. Patient verbalized understanding. Programming changes: - Atrial, RV & LV outputs programmed from 2.5V @ 0.16ms to 2.0V @ 0.53ms.Rozelle Banter, BSN, RN  NM PET Image Initial (PI) Skull Base To Thigh Result Date: 12/05/2023 EXAM: PET AND CT SKULL BASE TO MID THIGH 12/03/2023 02:39:23 PM TECHNIQUE: RADIOPHARMACEUTICAL: 11.83 mCi F-18 FDG Uptake time 60 minutes. Glucose level 84 mg/dl. Blood pool SUV 2.6. PET imaging was acquired from the base of the skull to the mid thighs. Non-contrast enhanced computed tomography was obtained for attenuation correction and anatomic localization. COMPARISON: Chest CT 11/13/2023. CLINICAL HISTORY: Lung nodule, > 8mm. FINDINGS: HEAD AND NECK: Hypoactivity in the left temporal lobe compatible with known prior CVA. Presumed physiologic tonsillar activity with maximum SUV 7.0 on the left and 6.8 on the right in the palatine tonsils. No metabolically active cervical lymphadenopathy. CHEST:  The pleural base mass posteriorly along the left lower lobe measures 4.0 x 2.5 cm on image 80 series 6 with maximum SUV 8.1 suspicious for malignancy. A smaller pleural base nodule anterior to the left upper lobe measures 0.6 cm in thickness on image 51 series 6 with maximum SUV 3.4. A pleural based nodule along the left upper lobe mediastinal margin adjacent to the aortic arch  on image 52 series 6 measures 0.7 cm in thickness and has a maximum SUV of 4.6 likewise suspicious. AP window lymph node 1.1 cm short axis on image 57 series 6 with maximum SUV 6.1, suspicious for malignancy. Cardiomegaly. Thoracic aortic, coronary artery, and branch vessel atheromatous vascular calcifications. ABDOMEN AND PELVIS: Subtle accentuated activity along the anterior superior spleen with maximum SUV 5.1 compared to typical background splenic activity elsewhere of 3.0. Significance uncertain. Hypermetabolic 8.2 cm mass of the right kidney lower pole maximum SUV 11.9, high suspicion for renal cell carcinoma or less likely a metastatic lesion. Systemic atherosclerosis is present, including the aorta and iliac arteries. Mild sigmoid colon diverticulosis. Physiologic activity within the gastrointestinal and genitourinary systems. No metabolically active lymphadenopathy. BONES AND SOFT TISSUE: Small focus of accentuated metabolic activity along the iliac side of the right sacroiliac joint with maximum SUV 4.7, nonspecific for early metastatic disease versus degenerative finding. IMPRESSION: 1. Hypermetabolic 8.2 cm right lower pole renal mass, highly suspicious for renal cell carcinoma or less likely metastasis. 2. Left lower lobe pleural-based mass (4.0 x 2.5 cm, SUV 8.1), suspicious for malignancy. 3. Left upper lobe pleural-based nodule (0.7 cm, SUV 4.6), suspicious for malignancy. 4. AP window lymph node (1.1 cm short axis, SUV 6.1), suspicious for malignancy. 5. Small hypermetabolic focus along the iliac side of the right sacroiliac  joint (SUV 4.7), indeterminate for early metastatic disease versus degenerative change. 6. Atherosclerosis and cardiomegaly. Electronically signed by: Ryan Salvage MD 12/05/2023 01:49 PM EST RP Workstation: [id number]   CT CHEST LUNG CA SCREEN LOW DOSE W/O CM Result Date: 11/17/2023 CLINICAL DATA:  Current 20+ pack-year smoker. EXAM: CT CHEST WITHOUT CONTRAST LOW-DOSE FOR LUNG CANCER SCREENING TECHNIQUE: Multidetector CT imaging of the chest was performed following the standard protocol without IV contrast. RADIATION DOSE REDUCTION: This exam was performed according to the departmental dose-optimization program which includes automated exposure control, adjustment of the mA and/or kV according to patient size and/or use of iterative reconstruction technique. COMPARISON:  12/19/2014. FINDINGS: Cardiovascular: Atherosclerotic calcification of the aorta, aortic valve and coronary arteries. Enlarged pulmonic trunk and heart. No pericardial effusion. Mediastinum/Nodes: No pathologically enlarged mediastinal or axillary lymph nodes. Hilar regions are difficult to definitively evaluate without IV contrast. Esophagus is grossly unremarkable. Lungs/Pleura: Image quality is somewhat degraded by expiratory phase imaging. Centrilobular emphysema. New rounded pleural mass in the posterior lower left hemithorax measures 2.6 x 4.2 cm (2/40). No additional pulmonary nodules. No pleural fluid. Minimal debris in the airway. Upper Abdomen: Visualized portions of the liver, adrenal glands, spleen, stomach and bowel are grossly unremarkable. Musculoskeletal: Degenerative changes in the spine. IMPRESSION: 1. New pleural mass in the posterior lower left hemithorax. Lung-RADS 4B, suspicious. Additional imaging evaluation or consultation with Pulmonology or Thoracic Surgery recommended. These results will be called to the ordering clinician or representative by the Radiologist Assistant, and communication documented in the PACS or  [employer]. 2. Aortic atherosclerosis (ICD10-I70.0). Coronary artery calcification. 3. Enlarged pulmonic trunk, indicative of pulmonary arterial hypertension. 4.  Emphysema (ICD10-J43.9). Electronically Signed   By: Newell Eke M.D.   On: 11/17/2023 13:39    ASSESSMENT & PLAN:  ***  Recommendations:   All questions were answered. The patient knows to call the clinic with any problems, questions or concerns.      Powell FORBES Lessen, NP 12/11/2023 12:33 PM  ADDENDUM: Hematology/Oncology Attending:  ***

## 2023-12-12 DIAGNOSIS — N2889 Other specified disorders of kidney and ureter: Secondary | ICD-10-CM | POA: Insufficient documentation

## 2023-12-12 NOTE — Progress Notes (Unsigned)
 Jenna Cordella LABOR, MD  Baldwin Rosella L PROCEDURE / BIOPSY REVIEW Date: 12/12/23  Requested Biopsy site: right kidney Reason for request: right renal lower pole mass Imaging review: Best seen on PET  Decision: Approved Imaging modality to perform: CT Schedule with: Moderate Sedation Schedule for: Any VIR  Additional comments: @Schedulers   Please contact me with questions, concerns, or if issue pertaining to this request arise.  Cordella LABOR Jenna, MD [facility]       Previous Messages    ----- Message ----- From: Baldwin Rosella CROME Sent: 12/12/2023  12:22 PM EST To: Rosella CROME Baldwin; Ir Procedure Requests Subject: CT RENAL BIOPSY                                Procedure :CT RENAL BIOPSY  Reason :large right renal mass suspicious for renal cell carcinoma Dx: Renal mass, right [W71.10 (ICD-10-CM)]    History :NM PET Image Initial (PI) Skull Base To Thigh,CT CHEST LUNG CA SCREEN LOW DOSE W/O CM  Provider:  Hanford Powell BRAVO, NP  Provider contact ; [phone]

## 2023-12-12 NOTE — Assessment & Plan Note (Signed)
 Henry Schneider is a 68 y.o. male with h/o chronic systolic heart failure due to ischemic cardiomyopathy, hypertension,, atrial fibrillation, hyperlipidemia, PVD, DVT, PE, Buerger's disease, emphysema, and mitral regurgitation.  He was initially referred to hematology by his primary care provider due to abnormal leukocyte count.  He then had his annual CT chest/lung cancer screening on 11/15/2023.  There was a new pleural mass seen, suspicious for malignancy.  He also has underlying emphysema.  Due to these results, a PET scan was ordered.  He had a PET scan on 12/03/2023.  This did show a hypermetabolic left pleural mass measuring 4.0 x 2.5 cm in diameter.  This was suspicious for malignancy.  He also has multiple small nodular densities which are hypermetabolic bilobular.  There is also a hypermetabolic right kidney mass measuring 8.2 cm in diameter.  This is suspicious for renal cell carcinoma.  There is also hypermetabolic activity in the bone at the right sacroiliac joint.  Of note, the patient did suffer a stroke in June.  This resulted in expressive aphasia and some short-term memory loss.  He is a long-term smoker.  States that he has smoked for about 45 years.  States that when he had a stroke, has been he was diagnosed with atrial fibrillation and blood clot.  He is on Eliquis .  He has a pacemaker.  He is scheduled to see his cardiologist and his pulmonologist next week.  He is not a great historian.  He does state his brother and father were both treated for cancer.  Called his brother during her visit to see what cancer they had been treated for.  Both were treated for bladder cancer in the past. Suspicion for renal cell carcinoma of the right kidney was discussed with the patient.  A CT-guided right renal biopsy has been recommended.  There is low suspicion for left pleural malignancy however, if IR reluctant to biopsy right renal mass, a CT-guided biopsy of left lung pleural mass will be recommended.   Definitive diagnosis is needed in order to proceed with treatment recommendations.  Patient will follow-up in 2 weeks with Dr. Sherrod to discuss results and plan for treatment.

## 2023-12-15 ENCOUNTER — Ambulatory Visit (INDEPENDENT_AMBULATORY_CARE_PROVIDER_SITE_OTHER): Admitting: Acute Care

## 2023-12-15 ENCOUNTER — Encounter: Payer: Self-pay | Admitting: Acute Care

## 2023-12-15 VITALS — BP 105/71 | HR 61 | Temp 97.7°F | Ht 75.0 in | Wt 274.4 lb

## 2023-12-15 DIAGNOSIS — R911 Solitary pulmonary nodule: Secondary | ICD-10-CM | POA: Diagnosis not present

## 2023-12-15 DIAGNOSIS — Z87891 Personal history of nicotine dependence: Secondary | ICD-10-CM | POA: Diagnosis not present

## 2023-12-15 DIAGNOSIS — R9389 Abnormal findings on diagnostic imaging of other specified body structures: Secondary | ICD-10-CM | POA: Diagnosis not present

## 2023-12-15 DIAGNOSIS — R942 Abnormal results of pulmonary function studies: Secondary | ICD-10-CM | POA: Diagnosis not present

## 2023-12-15 DIAGNOSIS — J449 Chronic obstructive pulmonary disease, unspecified: Secondary | ICD-10-CM

## 2023-12-15 NOTE — Progress Notes (Signed)
 History of Present Illness Henry Schneider is a 68 y.o. male current every day smoker followed through the lung cancer screening program , referred for an abnormal LDCT ( 4 B), 10/2023.He will be followed by Dr. Shelah and Lauraine Lites NP   12/15/2023 Discussed the use of AI scribe software for clinical note transcription with the patient, who gave verbal consent to proceed.  History of Present Illness Pt. Presents for consult  after PET scan done to further evaluate his LR 4 B lung cancer screening scan. He states he is doing well. He denies any weight loss or hemoptysis.   We have reviewed the PET scan results. They were positive for a hypermetabolic left pleural mass measuring 4.0 x 2.5 cm in diameter. This was suspicious for malignancy. He also has multiple small nodular densities which are hypermetabolic bilobular. There is also a hypermetabolic right kidney mass measuring 8.2 cm in diameter. This is suspicious for renal cell carcinoma. There is also hypermetabolic activity in the bone at the right sacroiliac joint. The patient did suffer a stroke in June 2025 . This resulted in expressive aphasia and some short-term memory loss. He is a long-term smoker. States that he has smoked for about 45 years. His brother and father were both treated for cancer.  Both were also treated for bladder cancer in the past.   He had subsequently been seen by oncology, referred initially by his PCP for abnormal leukocyte count . They saw the PET scan results, and have set him up for a CT guided biopsy of the right renal mass. Pt. Is on Eliquis  for a fib and history of embolic disease. If IR defer on Ct Guided biopsy of the renal mass, he may need consideration for  a CT-guided biopsy of left lung pleural mass vs Bronch with EBUS as there is a Suspicious AP window lymph node 1.1 cm  with maximum SUV 6.1, suspicious for malignancy. I have messaged Dr.Mohamed to let him know to refer back to us  for bronch with EBUS if  he feels the need to determine if this is 2 separate primary cancers as patient is a heavy smoker.   Socially, he lives alone. He is unmarried. He has no children.  Does have a niece that lives in [place]  who is an [employer].  The remainder of his family live in [place] .  States he currently does not work.  He is a music therapist by trade.  Has not been able to work for some time due to overall health condition.      Patient states he quit smoking 2 days ago cold turkey. He smoker 1.5 packs per day x about 45 years.   He does best with My Chart messaging. He has a very hard time with phone calls.     Test Results: PET Scan 12/04/2023 Hypermetabolic 8.2 cm right lower pole renal mass, highly suspicious for renal cell carcinoma or less likely metastasis. 2. Left lower lobe pleural-based mass (4.0 x 2.5 cm, SUV 8.1), suspicious for malignancy. 3. Left upper lobe pleural-based nodule (0.7 cm, SUV 4.6), suspicious for malignancy. 4. AP window lymph node (1.1 cm short axis, SUV 6.1), suspicious for malignancy. 5. Small hypermetabolic focus along the iliac side of the right sacroiliac joint (SUV 4.7), indeterminate for early metastatic disease versus degenerative change. 6. Atherosclerosis and cardiomegaly.  LDCT Chest 11/13/2023 New pleural mass in the posterior lower left hemithorax. Lung-RADS 4B, suspicious. Additional imaging evaluation or consultation with Pulmonology or Thoracic Surgery  recommended. These results will be called to the ordering clinician or representative by the Radiologist Assistant, and communication documented in the PACS or [employer]. 2. Aortic atherosclerosis (ICD10-I70.0). Coronary artery calcification. 3. Enlarged pulmonic trunk, indicative of pulmonary arterial hypertension. 4.  Emphysema (ICD10-J43.9).  MRI Brain 06/2023 Brain: Acute left MCA territory infarcts including acute infarcts in left basal ganglia, left insula, overlying left frontal,  parietal and temporal lobes. Associated edema with trace (1-2 mm) rightward midline shift. No evidence of acute hemorrhage, mass lesion, or hydrocephalus. Patchy T2/FLAIR hyperintensities in the white matter, compatible with chronic microvascular disease.   Vascular: Major arterial flow voids are maintained at the skull base.   Skull and upper cervical spine: Normal marrow signal.   Sinuses/Orbits: Mild paranasal sinus mucosal thickening. No acute orbital findings.   IMPRESSION: Acute left MCA territory infarcts with trace (1-2 mm) rightward midline shift.    Latest Ref Rng & Units 12/11/2023    3:58 PM 11/11/2023   10:24 AM 10/28/2023    9:14 AM  CBC  WBC 4.0 - 10.5 K/uL 12.7  11.8  11.7   Hemoglobin 13.0 - 17.0 g/dL 82.7  82.8  83.6   Hematocrit 39.0 - 52.0 % 52.6  52.6  49.9   Platelets 150 - 400 K/uL 172  177.0  177.0        Latest Ref Rng & Units 12/11/2023    3:58 PM 10/28/2023    9:14 AM 07/17/2023    4:59 AM  BMP  Glucose 70 - 99 mg/dL 98  892  98   BUN 8 - 23 mg/dL 26  24  13    Creatinine 0.61 - 1.24 mg/dL 8.87  8.89  8.87   Sodium 135 - 145 mmol/L 141  141  139   Potassium 3.5 - 5.1 mmol/L 5.0  4.3  4.4   Chloride 98 - 111 mmol/L 104  107  111   CO2 22 - 32 mmol/L 28  26  25    Calcium  8.9 - 10.3 mg/dL 9.8  9.8  8.8     BNP    Component Value Date/Time   BNP 794.0 (H) 12/19/2014 0636    ProBNP No results found for: PROBNP  PFT No results found for: FEV1PRE, FEV1POST, FVCPRE, FVCPOST, TLC, DLCOUNC, PREFEV1FVCRT, PSTFEV1FVCRT  CUP PACEART INCLINIC DEVICE CHECK Result Date: 12/09/2023 Normal in-clinic CRT-D (multi-lead) check. Presenting Rhythm: AS/BP 73 w/ PVC's. Routine testing was performed. Thresholds, sensing, impedance trend were stable and no changes were required. HF diagnostics are stable. No treated arrhythmias. Brief NSVT episodes noted longest duration 14 seconds. Patient BiV pacing 92% of the time. Estimated longevity 2  years. Pt enrolled in remote follow-up. Patient has GORE lead. Impedance stable at this time. Patient educated and received magnet for shock plan containing device clinic number. Patient verbalized understanding. Programming changes: - Atrial, RV & LV outputs programmed from 2.5V @ 0.62ms to 2.0V @ 0.69ms.Rozelle Banter, BSN, RN  NM PET Image Initial (PI) Skull Base To Thigh Result Date: 12/05/2023 EXAM: PET AND CT SKULL BASE TO MID THIGH 12/03/2023 02:39:23 PM TECHNIQUE: RADIOPHARMACEUTICAL: 11.83 mCi F-18 FDG Uptake time 60 minutes. Glucose level 84 mg/dl. Blood pool SUV 2.6. PET imaging was acquired from the base of the skull to the mid thighs. Non-contrast enhanced computed tomography was obtained for attenuation correction and anatomic localization. COMPARISON: Chest CT 11/13/2023. CLINICAL HISTORY: Lung nodule, > 8mm. FINDINGS: HEAD AND NECK: Hypoactivity in the left temporal lobe compatible with known prior CVA. Presumed  physiologic tonsillar activity with maximum SUV 7.0 on the left and 6.8 on the right in the palatine tonsils. No metabolically active cervical lymphadenopathy. CHEST: The pleural base mass posteriorly along the left lower lobe measures 4.0 x 2.5 cm on image 80 series 6 with maximum SUV 8.1 suspicious for malignancy. A smaller pleural base nodule anterior to the left upper lobe measures 0.6 cm in thickness on image 51 series 6 with maximum SUV 3.4. A pleural based nodule along the left upper lobe mediastinal margin adjacent to the aortic arch on image 52 series 6 measures 0.7 cm in thickness and has a maximum SUV of 4.6 likewise suspicious. AP window lymph node 1.1 cm short axis on image 57 series 6 with maximum SUV 6.1, suspicious for malignancy. Cardiomegaly. Thoracic aortic, coronary artery, and branch vessel atheromatous vascular calcifications. ABDOMEN AND PELVIS: Subtle accentuated activity along the anterior superior spleen with maximum SUV 5.1 compared to typical background splenic  activity elsewhere of 3.0. Significance uncertain. Hypermetabolic 8.2 cm mass of the right kidney lower pole maximum SUV 11.9, high suspicion for renal cell carcinoma or less likely a metastatic lesion. Systemic atherosclerosis is present, including the aorta and iliac arteries. Mild sigmoid colon diverticulosis. Physiologic activity within the gastrointestinal and genitourinary systems. No metabolically active lymphadenopathy. BONES AND SOFT TISSUE: Small focus of accentuated metabolic activity along the iliac side of the right sacroiliac joint with maximum SUV 4.7, nonspecific for early metastatic disease versus degenerative finding. IMPRESSION: 1. Hypermetabolic 8.2 cm right lower pole renal mass, highly suspicious for renal cell carcinoma or less likely metastasis. 2. Left lower lobe pleural-based mass (4.0 x 2.5 cm, SUV 8.1), suspicious for malignancy. 3. Left upper lobe pleural-based nodule (0.7 cm, SUV 4.6), suspicious for malignancy. 4. AP window lymph node (1.1 cm short axis, SUV 6.1), suspicious for malignancy. 5. Small hypermetabolic focus along the iliac side of the right sacroiliac joint (SUV 4.7), indeterminate for early metastatic disease versus degenerative change. 6. Atherosclerosis and cardiomegaly. Electronically signed by: Ryan Salvage MD 12/05/2023 01:49 PM EST RP Workstation: [id number]     Past medical hx Past Medical History:  Diagnosis Date   A-fib [facility])    Acute ischemic left middle cerebral artery (MCA) stroke (HCC) 07/15/2023   aphasia   Aphasia due to acute cerebrovascular accident (CVA) (HCC)    Buerger's disease    Cardiac LV ejection fraction 21-30% 07/12/2023   June 2020, EF 25-30%. while in 2016, EF was only 15%   Chronic combined systolic and diastolic CHF (congestive heart failure) (HCC)    Congestive heart failure (CHF) (HCC)    Diabetes mellitus without complication (HCC)    Emphysema of lung (HCC)    Grade I diastolic dysfunction    Graves disease     Heart disease    Hyperlipidemia    Hypertension    ICD (implantable cardioverter-defibrillator) in place, battery check 09-15-23    s/p [facility] CRT-D placement   LBBB (left bundle branch block)    Non-ischemic cardiomyopathy (HCC)    NSVT (nonsustained ventricular tachycardia) (HCC)    NYHA Class III cardiovascular function (HCC)    Obesity (BMI 30-39.9)    Peripheral vascular disease    Pulmonary embolism (HCC)    History of pulmonary emboli while on Eliquis ; A-fib   Stroke (HCC)    Syncope, cardiogenic    Systolic congestive heart failure with reduced left ventricular function, NYHA class 3 (HCC)    Thyroid disease    Ventricular tachycardia (HCC)  Social History   Tobacco Use   Smoking status: Former    Current packs/day: 0.00    Types: Cigarettes    Quit date: 12/12/2023  Vaping Use   Vaping status: Never Used  Substance Use Topics   Alcohol use: Not Currently    Comment: Once a month   Drug use: Yes    Comment: CBD gummies    Mr.Meanor reports that he quit smoking 3 days ago. His smoking use included cigarettes. He does not have any smokeless tobacco history on file. He reports that he does not currently use alcohol. He reports current drug use.  Tobacco Cessation: Counseling given: Not Answered Quit smoking 2 days ago, cold turkey  Past surgical hx, Family hx, Social hx all reviewed.  Current Outpatient Medications on File Prior to Visit  Medication Sig   apixaban  (ELIQUIS ) 5 MG TABS tablet Take 1 tablet (5 mg total) by mouth 2 (two) times daily.   atorvastatin  (LIPITOR) 40 MG tablet Take 1 tablet (40 mg total) by mouth daily.   buPROPion (WELLBUTRIN SR) 150 MG 12 hr tablet Take 1 tablet by mouth daily for 3 days, then increase to 1 tablet by mouth twice daily   dapagliflozin  propanediol (FARXIGA ) 10 MG TABS tablet Take 1 tablet (10 mg total) by mouth daily.   furosemide  (LASIX ) 20 MG tablet Take 1 tablet (20 mg total) by mouth daily.    methimazole (TAPAZOLE) 5 MG tablet Take 1 tablet (5 mg total) by mouth daily.   metoprolol  succinate (TOPROL -XL) 50 MG 24 hr tablet Take 2 tablets (100mg ) by mouth in the morning and 1 tablet (50mg ) in the afternoon.   spironolactone  (ALDACTONE ) 25 MG tablet Take 0.5 tablets (12.5 mg total) by mouth daily.   No current facility-administered medications on file prior to visit.     No Known Allergies  Review Of Systems:  Constitutional:   No  weight loss, night sweats,  Fevers, chills, fatigue, or  lassitude.  HEENT:   No headaches,  Difficulty swallowing,  Tooth/dental problems, or  Sore throat,                No sneezing, itching, ear ache, nasal congestion, post nasal drip,   CV:  No chest pain,  Orthopnea, PND, swelling in lower extremities, anasarca, dizziness, palpitations, syncope.   GI  No heartburn, indigestion, abdominal pain, nausea, vomiting, diarrhea, change in bowel habits, loss of appetite, bloody stools.   Resp: + shortness of breath with exertion less at rest.  No excess mucus, no productive cough,  No non-productive cough,  No coughing up of blood.  No change in color of mucus.  No wheezing.  No chest wall deformity, some chest pain on his left that he feels is related to where the left lower lobe chest mass is.   Skin: no rash or lesions.  GU: no dysuria, change in color of urine, no urgency or frequency.  No flank pain, no hematuria   MS:  No joint pain or swelling.  No decreased range of motion.  No back pain.  Psych:  No change in mood or affect. No depression or anxiety. + short term  memory loss due to stroke 06/2023. + expressive aphasia   Vital Signs BP 105/71   Pulse 61   Temp 97.7 F (36.5 C) (Oral)   Ht 6' 3 (1.905 m)   Wt 274 lb 6.4 oz (124.5 kg)   SpO2 95%   BMI 34.30 kg/m    Physical  Exam:  General- No distress,  A&Ox3, short term memory loss, some expressive aphasia ENT: No sinus tenderness, TM clear, pale nasal mucosa, no oral exudate,no  post nasal drip, no LAN Cardiac: S1, S2, regular rate and rhythm, no murmur Chest: No wheeze/ rales/ dullness; no accessory muscle use, no nasal flaring, no sternal retractions, few rhonchi Abd.: Soft Non-tender, ND, BS +, Body mass index is 34.3 kg/m.  Ext: No clubbing cyanosis, edema, no obvious deformities Neuro:  normal strength, MAE x 4, A&O x 3, appropriate Skin: No rashes, warm and dry, no obvious skin lesions  Psych: normal mood and behavior, short term memory issues, expressive aphasia  Physical Exam    Assessment/Plan  Assessment and Plan Assessment & Plan Hypermetabolic 8.2 cm right lower pole renal mass, highly suspicious for renal cell carcinoma or less likely metastasis. Left lower lobe pleural-based mass (4.0 x 2.5 cm, SUV 8.1), suspicious for malignancy. Left upper lobe pleural-based nodule (0.7 cm, SUV 4.6), suspicious for malignancy. AP window lymph node (1.1 cm short axis, SUV 6.1), suspicious for malignancy. Small hypermetabolic focus along the iliac side of the right sacroiliac joint (SUV 4.7), indeterminate for early metastatic disease versus degenerative change. Heavy smoker , recently quit cold turkey. Family history of lung and bladder cancer Plan Oncology has arranged for a biopsy of the renal mass by IR Per oncology note, if IR cannot get renal mass, plan will be for CT Guided lung biopsy by IR. May need pulmonary biopsy / EBUS regardless as could be 2 primary cancers as patient has a heavy smoking history.  Messaged Dr. Sherrod to let him know we can scheduled bronch to assess lymph node if necessary.  Smoking cessation / abstinence encouraged.   AVS 12/15/2023 We have looked at your PET scan and CT scan. You have a right mass on your kidney. Dr. Sherrod had ordered a biopsy of this to help determine if this is a cancer. You should be getting a phone call to get this scheduled. You also have some lung nodules in your left upper lobe, and your left lower  lobe that are also concerning for cancer. After you have the kidney mass evaluated, we will determine how best to evaluate the lung nodules. I have sent a message to Dr. Sherrod regarding best options to determine management of these lung nodules once the kidney masses evaluated. You will need to come off your Eliquis  which is a blood thinner before any biopsy. You will get very specific instructions on when to stop this medication. Do not stop the medication until you receive specific instructions before your biopsy Once they have determined what the kidney mass is, treatment options will become clear. Please call the office if you have any questions or concerns. My nurse Louella will reach out to you through MyChart in about a week to make sure you are kidney biopsy has been scheduled. I will let you know through MyChart when I hear back from Dr. Sherrod regarding management of your lung nodules. Please call if you need us  for anything. Please contact office for sooner follow up if symptoms do   I spent 25 minutes dedicated to the care of this patient on the date of this encounter to include pre-visit review of records, face-to-face time with the patient discussing conditions above, post visit ordering of testing, clinical documentation with the electronic health record, making appropriate referrals as documented, and communicating necessary information to the patient's healthcare team.      Lauraine  JULIANNA Lites, NP 12/15/2023  1:58 PM

## 2023-12-15 NOTE — Patient Instructions (Addendum)
 It is good to see you today. We have looked at your PET scan and CT scan. You have a right mass on your kidney. Dr. Sherrod had ordered a biopsy of this to help determine if this is a cancer. You should be getting a phone call to get this scheduled. You also have some lung nodules in your left upper lobe, and your left lower lobe that are also concerning for cancer. After you have the kidney mass evaluated, we will determine how best to evaluate the lung nodules. I have sent a message to Dr. Sherrod regarding best options to determine management of these lung nodules once the kidney mass has been  evaluated. You will need to come off your Eliquis  which is a blood thinner before any biopsy. You will get very specific instructions on when to stop this medication. Do not stop the medication until you receive specific instructions before your biopsy Once they have determined what the kidney mass is, treatment options will become clear. Please call the office if you have any questions or concerns. My nurse Louella will reach out to you through MyChart in about a week to make sure you are kidney biopsy has been scheduled. I will let you know through MyChart when I hear back from Dr. Sherrod regarding management of your lung nodules. Please call if you need us  for anything. Please contact office for sooner follow up if symptoms do not improve or worsen or seek emergency care

## 2023-12-17 ENCOUNTER — Other Ambulatory Visit

## 2023-12-17 ENCOUNTER — Encounter: Payer: Self-pay | Admitting: Cardiology

## 2023-12-17 ENCOUNTER — Telehealth: Payer: Self-pay

## 2023-12-17 ENCOUNTER — Ambulatory Visit: Attending: Cardiovascular Disease | Admitting: Cardiology

## 2023-12-17 VITALS — BP 96/64 | HR 65 | Ht 72.0 in | Wt 273.2 lb

## 2023-12-17 DIAGNOSIS — F17200 Nicotine dependence, unspecified, uncomplicated: Secondary | ICD-10-CM | POA: Insufficient documentation

## 2023-12-17 DIAGNOSIS — E079 Disorder of thyroid, unspecified: Secondary | ICD-10-CM | POA: Diagnosis present

## 2023-12-17 DIAGNOSIS — I731 Thromboangiitis obliterans [Buerger's disease]: Secondary | ICD-10-CM | POA: Diagnosis present

## 2023-12-17 DIAGNOSIS — I2782 Chronic pulmonary embolism: Secondary | ICD-10-CM | POA: Diagnosis present

## 2023-12-17 DIAGNOSIS — E785 Hyperlipidemia, unspecified: Secondary | ICD-10-CM | POA: Diagnosis present

## 2023-12-17 DIAGNOSIS — I5042 Chronic combined systolic (congestive) and diastolic (congestive) heart failure: Secondary | ICD-10-CM | POA: Diagnosis present

## 2023-12-17 DIAGNOSIS — I480 Paroxysmal atrial fibrillation: Secondary | ICD-10-CM | POA: Insufficient documentation

## 2023-12-17 DIAGNOSIS — I63312 Cerebral infarction due to thrombosis of left middle cerebral artery: Secondary | ICD-10-CM | POA: Insufficient documentation

## 2023-12-17 DIAGNOSIS — Z716 Tobacco abuse counseling: Secondary | ICD-10-CM

## 2023-12-17 NOTE — Progress Notes (Unsigned)
 Cardiology Office Note:  .   Date:  12/18/2023  ID:  Henry Schneider, DOB 06-Nov-1955, MRN [id number] PCP: Billy Philippe SAUNDERS, NP  [facility] Providers Cardiologist:  None Electrophysiologist:  Will Gladis Norton, MD     Chief Complaint  Patient presents with   Hospitalization Follow-up    Seen by EP but not by primary cardiology ; admitted with stroke this summer.  Apparently missed doses of Eliquis .   Cardiomyopathy    Longstanding cardiomyopathy.  GDMT now limited by hypotension.   Congestive Heart Failure    Only symptom is exertional dyspnea.  NYHA class II   Coronary Artery Disease   Atrial Fibrillation    Patient Profile: .     Henry Schneider is a moderately obese 68 y.o. male ongoing smoker (40-pack-year) with a PMH noted below who presents here to establish cardiology care in [place].  Referred at the request of Billy Philippe SAUNDERS, NP.  hx of NICM (EF 15% in April 2017, s/p Bos Sci CRT-D placement), HTN, HLD, chronic LBBB, PVD, DVT, PE, Buerger's disease, emphysema, and recent CVA Syncope in April 2021-Pase-terminated PT CVA in June.  Symptom was mostly related to speech (expressive aphasia).  Still having issues poststroke.  Apparently had missed doses of Eliquis  around the time of her stroke. New pleural mass noted on CT scan as of November-pending oncology evaluation  Probably because of expressive aphasia, he is a very poor historian.  Does not seem to know much about his history or is not able to answer questions.     Henry Schneider was initially seen by Dr. Norton on November 11 as a follow-up for new diagnosis of A-fib following a stroke.  Interrogation of his device indicated multiple episodes of V. tach usually treated with ATP but occasionally resulting in ICD shock.  Discussed the potential need for AAD's.  Plan was for 4-month follow-up.  Subjective  Discussed the use of AI scribe software for clinical note transcription with the patient,  who gave verbal consent to proceed.  History of Present Illness Henry Schneider is a 68 year old male with cardiomyopathy and stroke who presents for follow-up care.  He has a history of cardiomyopathy with a low ejection fraction since 2016, diagnosed following a pulmonary embolus. He has been under cardiology care since 2017, initially in [place], where he received a defibrillator. He moved to [place]  last fall but has been in the area intermittently for ten years. His last cardiology visit in [place] was in May 2024.  In June 2025, he experienced a stroke, coinciding with missed doses of his blood thinner, Eliquis . Post-stroke, his medication regimen was adjusted, and he is currently on Eliquis , atorvastatin  40 mg daily, Farxiga  10 mg daily, furosemide  20 mg daily, spironolactone  12.5 mg daily, and Toprol  XL 50 mg twice in the morning and once in the evening. He is no longer taking valsartan or metformin.  No palpitations or dizziness. He experiences shortness of breath with exertion, attributing it to smoking and lung issues. He is undergoing evaluation for a lung nodule with a biopsy scheduled soon.  His social history includes a history of smoking, with recent attempts to quit, and minimal alcohol consumption.  He has ported a history of Berger's disease.  He has a history of Graves' disease, for which he takes methimazole. No significant weight gain recently, although he has lost about ten pounds over the past two to three weeks. He does not weigh himself daily. No recent symptoms of  stroke recurrence.    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  He has never been titrated to adequate GDMT.   Cardiovascular ROS: positive for - dyspnea on exertion, irregular heartbeat, palpitations, rapid heart rate, shortness of breath, and he claims his dyspnea is related to smoking and nothing really new.   Every now and then he feels the episodes where the defibrillator does something. negative for - chest pain, edema, orthopnea, paroxysmal nocturnal dyspnea, or syncope or near syncope, TIA or amaurosis fugax, claudication  ROS:  Review of Systems - Negative except that he is generally sedentary.    Objective    Patient has a past medical history of Acute deep vein thrombosis (DVT) of iliac vein of right lower extremity (CMS/HCC), Arthritis, Buerger's disease (CMS/HCC), Chronic combined systolic (congestive) and diastolic (congestive) heart failure (CMS/HCC), Colon polyps (2015 & 09/07/2015), Depression, Graves disease (03/2017), History of implantable cardioverter-defibrillator (ICD) placement (05/2015), Hypercholesteremia, Hypertension, Hypertensive heart disease with heart failure (CMS/HCC), Hypoxemia, Left bundle branch block (LBBB), Obesity (BMI 35.0-39.9 without comorbidity), Pneumonia (11/2014), Pulmonary emboli (CMS/HCC) (11/2014), PVD (peripheral vascular disease) (CMS/HCC), Syncope and collapse (03/2017), Systolic congestive heart failure with reduced left ventricular function, NYHA class 3 (CMS/HCC), Tobacco abuse, and Ventricular tachycardia (CMS/HCC) (03/2017). and has a past surgical history that includes Insert / replace / remove pacemaker; Tibia fracture surgery (Right, 04/24/2017); and Cardiac defibrillator placement (N/A, 05/2015).  Device History: [employer] BiV ICD implanted 2017 for chronic systolic heart failure History of appropriate therapy: Yes History of AAD therapy: No    FAMILY HISTORY: Negative for premature coronary disease.  SOCIAL HISTORY: Patient reports that he has been smoking cigarettes. He has a 35 pack-year smoking history. He has never used smokeless tobacco.  Meds as of 05/2022:   atorvastatin  (LIPITOR) 40 mg tablet, Take 2 tablets (80 mg total) by mouth daily., Disp: 90 tablet, Rfl: 3 Eliquis  5 mg tablet, TAKE 1 TABLET(5 MG) BY MOUTH TWICE DAILY,  Disp: 120 tablet, Rfl: 6 metoprolol  succinate XL (Toprol  XL) 50 mg 24 hr tablet, Take 3 tablets (150 mg total) by mouth daily. 100mg  in AM, 50mg  in PM, Disp: , Rfl:  spironolactone  (ALDACTONE ) 25 mg tablet, TAKE 1/2 TAB BY MOUTH DAILY, Disp: 45 tablet, Rfl: 3 valsartan (DIOVAN) 40 mg tablet, TAKE 1 TABLET BY MOUTH EVERY DAY, Disp: 30 tablet, Rfl: 7 =>  furosemide  (LASIX ) 20 mg tablet, Take 1 tablet (20 mg total) by mouth daily., Disp: 60 tablet, Rfl: 5. Farxiga  10 mg p.o. daily metFORMIN (GLUCOPHAGE) 500 mg tablet, Take 1 tablet (500 mg total) by mouth daily with breakfast., Disp: 30 tablet, Rfl: 11  Methimazole  5 mg daily -Listed med is Wellbutrin  150 mg daily but he is not sure if he is taking.  Studies Reviewed: SABRA        Lab Results  Component Value Date   CHOL 111 10/28/2023   HDL 47.50 10/28/2023   LDLCALC 52 10/28/2023   TRIG 62.0 10/28/2023   CHOLHDL 2 10/28/2023   Lab Results  Component Value Date   HGBA1C 6.7 (H) 10/28/2023   Lab Results  Component Value Date   NA 141 12/11/2023   K 5.0 12/11/2023   CREATININE 1.12 12/11/2023   GFRNONAA >60 12/11/2023   GLUCOSE 98 12/11/2023    Results DIAGNOSTIC Echocardiogram (07/12/2023): EF 25 to 30%.  Mild LVH.  G1 DD.  Normal RV.  Mild RA and  LA dilation.  Normal valves. Echocardiogram 02/26/2021: Moderate dilated LV with severely Doose EF of 25 to 30%.  Severe inferior and inferior wall as well as distal septal/apical hypokinesis.  Mild concentric LVH.  Moderately dilated RV with normal function.  Minimal thickening of both aortic and mitral leaflets.  Mild to moderate MR and moderate TR with mean RVSP estimated at 45 mmHg PPM/ICD wires in the RA and RV.  (Further reduced EF with more pronounced MR and TR as well as elevated PAP when compared to April 2021. Echocardiogram April 2021: Mild symmetric LVH with moderate dilated LVEF 60 cm.  Moderately reduced function of 30 to 35%.  Unable to assess diastolic parameters but there is  moderate LAE and mild RA dilation.  Normal RVSP.  AV calcification but no stenosis.\ Echocardiogram: Ejection Fraction (EF) 15% (2016) Heart catheterization: Normal coronary arteries (04/2015) Stress test: No evidence of myocardial infarction; low EF (08/2021)   Risk Assessment/Calculations:    CHA2DS2-VASc Score = 7   This indicates a 11.2% annual risk of stroke. The patient's score is based upon: CHF History: 1 HTN History: 1 Diabetes History: 1 Stroke History: 2 Vascular Disease History: 1 Age Score: 1 Gender Score: 0            Physical Exam:   VS:  BP 96/64 (BP Location: Left Arm, Patient Position: Sitting, Cuff Size: Large)   Pulse 65   Ht 6' (1.829 m)   Wt 273 lb 3.2 oz (123.9 kg)   SpO2 95%   BMI 37.05 kg/m    Wt Readings from Last 3 Encounters:  12/17/23 273 lb 3.2 oz (123.9 kg)  12/15/23 274 lb 6.4 oz (124.5 kg)  12/11/23 279 lb 4.8 oz (126.7 kg)      GEN: moderately obese.  Well nourished, well groomed; in no acute distress;  NECK: No JVD; No carotid bruits CARDIAC: Mostly RRR with intermittent ectopy; distant, but normal S1 and S2 (unclear if there is an S4 gallop); mild 1/6 SEM at RUSB but otherwise no rubs. RESPIRATORY:  Clear to auscultation without rales, wheezing or rhonchi ; nonlabored, good air movement. ABDOMEN: Soft, non-tender, non-distended EXTREMITIES:  No edema; No deformity      ASSESSMENT AND PLAN: .    Problem List Items Addressed This Visit       Cardiology Problems   Buerger's disease (Chronic)   Listed as having Buerger's disease, but really does not walk enough to know if he has claudication.  Can discuss claudication more in follow-up and potentially consider checking ABIs.      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   With the exception of exertional dyspnea he seems to  be pretty much asymptomatic.-NYHA class II AA  Exertional dyspnea is also likely related to obesity, deconditioning and long-term smoking.  GDMT imited by hypotension  - Continue Farxiga  10 mg daily, furosemide  20 Miller daily, spironolactone  12.5 mg, Toprol  XL 100 mg every morning and 50 mg every afternoon  - Will confer with the EP as to the benefit with him being on this high dose of beta-blocker.  Could potentially consider backing down on beta-blocker to allow for reinitiation of ARB.   - Advised weight monitoring for fluid retention, and maintain daily weights.. - Increase furosemide  if swelling or dyspnea occurs.  - Schedule follow-up  in spring.      Hyperlipidemia (Chronic)   Excellent lipid management on moderate dose of atorvastatin  40 mg daily. Continue monitoring results at least annually. -Continue atorvastatin  40 mg daily, adjust options based on follow-up labs.      Paroxysmal atrial fibrillation (HCC) (Chronic)   Atrial fibrillation managed with high dose Toprol  (100 mg morning and 50 mg in the evening for rate control)  On Eliquis  5 mg twice daily for CVA prophylaxis. No missed doses or symptoms. - Continue Eliquis  as prescribed. -Consider potentially reducing to 50 mg twice daily Toprol  if would potentially allow for reinitiation of ARB.  Not currently on antiarrhythmic agents.      Pulmonary embolism (HCC) (Chronic)   History of PE which is potentially when he was initially diagnosed with CHF. Secondary reason for being on chronic DOAC  - Continue Eliquis  5 mg twice daily      Stroke [facility]) (Chronic)   Stroke in June. No current symptoms. - Continue Eliquis  as prescribed. - Continue atorvastatin  and Farxiga         Other   Current smoker (Chronic)   Recent smoking cessation for two days. - Encouraged continued smoking cessation efforts.      Thyroid disease   Managed with methimazole. Asymptomatic. - Continue methimazole as prescribed.               Follow-Up: Return in about 6 months (around 06/15/2024) for 6 month follow-up with me, [address] [employer].  I spent 51 minutes in the care of Henry Schneider today including reviewing labs (2 minutes), reviewing outside labs from PCP via [employer] and in 2 minutes), reviewing studies (several echocardiograms as well as most recent echocardiogram evaluated.  Other studies reviewed from prior cardiology notes-8 minutes), face to face time discussing treatment options (19 minutes-session was cut short mostly because he really was a poor historian.), reviewing records from multiple different cardiology notes including EP but his previous cardiologist notes as well.  Overall reports studies reviewed (9 minutes), 13 minutes dictating, and documenting in the encounter.      Signed, Alm MICAEL Clay, MD, MS Alm Clay, M.D., M.S. Interventional Cardiologist  [facility] Pager # [phone]

## 2023-12-17 NOTE — Progress Notes (Signed)
 12/17/2023 Name: Henry Schneider MRN: [id number] DOB: 1955/08/05  Chief Complaint  Patient presents with   Medication Management    Henry Schneider is a 68 y.o. year old male who was referred for medication management by their primary care provider, Henry Philippe SAUNDERS, NP. They presented for a telephone today.   They were referred to the pharmacist by their PCP for assistance in managing smoking cessation    Subjective:  Care Team: Primary Care Provider: Billy Philippe SAUNDERS, NP    Medication Access/Adherence  Current Pharmacy:  [facility] [phone] [address] [place], [place] - [address] AT [facility] [address] [place] [address] Phone: [phone] Fax: [phone]  [facility] [address] [place] [address] Phone: [phone] Fax: [phone]   Patient reports affordability concerns with their medications: No  Patient reports access/transportation concerns to their pharmacy: No  Patient reports adherence concerns with their medications:  No     Tobacco Abuse: Current Med Therapy: Bupropion  SR 150mg   Tobacco Use History: Been smoking for 40 years Number of cigarettes per day: 1 pack per day (reports use to be 2 packs) Smokes first cigarette within 30 minutes after waking Triggers include driving in car and being in the habit  Quit Attempt History: Longest time ever been tobacco free 6 months Methods tried in the past include stopping cold turkey, using nicotine  gum, and chantix. 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 Last cigarette was 2am today but had gone several days without smoking prior   Objective:  Lab Results  Component Value Date   HGBA1C 6.7 (H) 10/28/2023    Lab Results  Component Value Date   CREATININE 1.12 12/11/2023   BUN 26 (H)  12/11/2023   NA 141 12/11/2023   K 5.0 12/11/2023   CL 104 12/11/2023   CO2 28 12/11/2023    Lab Results  Component Value Date   CHOL 111 10/28/2023   HDL 47.50 10/28/2023   LDLCALC 52 10/28/2023   TRIG 62.0 10/28/2023   CHOLHDL 2 10/28/2023    Medications Reviewed Today     Reviewed by Lionell Jon DEL, RPH (Pharmacist) on 12/17/23 at 1625  Med List Status: <None>   Medication Order Taking? Sig Documenting Provider Last Dose Status Informant  apixaban  (ELIQUIS ) 5 MG TABS tablet [id number]  Take 1 tablet (5 mg total) by mouth 2 (two) times daily. Jerilynn Daphne SAILOR, NP  Active   atorvastatin  (LIPITOR) 40 MG tablet [id number]  Take 1 tablet (40 mg total) by mouth daily. Jerilynn Daphne SAILOR, NP  Active   buPROPion  (WELLBUTRIN  SR) 150 MG 12 hr tablet [id number]  Take 1 tablet by mouth daily for 3 days, then increase to 1 tablet by mouth twice daily Williamson, Joanna R, NP  Active   dapagliflozin  propanediol (FARXIGA ) 10 MG TABS tablet [id number]  Take 1 tablet (10 mg total) by mouth daily. Jerilynn Daphne SAILOR, NP  Active   furosemide  (LASIX ) 20 MG tablet [id number]  Take 1 tablet (20 mg total) by mouth daily. Jerilynn Daphne SAILOR, NP  Active   methimazole  (TAPAZOLE ) 5 MG tablet [id number]  Take 1 tablet (5 mg total) by mouth daily. Williamson, Joanna R, NP  Active   metoprolol  succinate (TOPROL -XL) 50 MG 24 hr tablet [id number]  Take 2 tablets (100mg ) by mouth in the morning  and 1 tablet (50mg ) in the afternoon. Jerilynn Daphne SAILOR, NP  Active   spironolactone  (ALDACTONE ) 25 MG tablet [id number]  Take 0.5 tablets (12.5 mg total) by mouth daily. Jerilynn Daphne SAILOR, NP  Active               Assessment/Plan:   Tobacco Abuse - Currently uncontrolled, improving - Provided motivational interviewing to assess tobacco use and strategies for reduction - Provided information on 1 800 QUIT NOW support program - Continue Bupropion SR 150mg  and reminded to use nicotine 4mg  gum as needed for  breakthrough cravings if cough drops are not enough - Counseled patient on quit techniques, patient still prefers to try to stop all cigarettes completely instead of trying to taper down -Counseled on techniques to help curb cravings such as avoiding triggers and keeping mind busy  Follow Up Plan: 2 weeks  Jon VEAR Lindau, PharmD Clinical Pharmacist [phone]

## 2023-12-17 NOTE — Progress Notes (Signed)
   12/17/2023  Patient ID: Henry Schneider, male   DOB: Jul 07, 1955, 68 y.o.   MRN: [id number]  Attempted to contact patient for scheduled appointment for smoking cessation follow up. Left HIPAA compliant message for patient to return my call at their convenience.   Jon VEAR Lindau, PharmD Clinical Pharmacist [phone]

## 2023-12-18 ENCOUNTER — Encounter: Payer: Self-pay | Admitting: Cardiology

## 2023-12-18 DIAGNOSIS — F17200 Nicotine dependence, unspecified, uncomplicated: Secondary | ICD-10-CM | POA: Insufficient documentation

## 2023-12-18 NOTE — Assessment & Plan Note (Signed)
 Recent smoking cessation for two days. - Encouraged continued smoking cessation efforts.

## 2023-12-18 NOTE — Assessment & Plan Note (Signed)
 Excellent lipid management on moderate dose of atorvastatin  40 mg daily. Continue monitoring results at least annually. -Continue atorvastatin  40 mg daily, adjust options based on follow-up labs.

## 2023-12-18 NOTE — Patient Instructions (Signed)
 1

## 2023-12-18 NOTE — Assessment & Plan Note (Signed)
 Stroke in June. No current symptoms. - Continue Eliquis  as prescribed. - Continue atorvastatin  and Farxiga 

## 2023-12-18 NOTE — Assessment & Plan Note (Signed)
 History of PE which is potentially when he was initially diagnosed with CHF. Secondary reason for being on chronic DOAC  - Continue Eliquis  5 mg twice daily

## 2023-12-18 NOTE — Progress Notes (Unsigned)
 Hanford Powell BRAVO, NP  Baldwin Channing BURNETT Tommie, Tiffany Cc: Sherrod Sherrod, MD Thank you for letting me know. Karyle, NP       Previous Messages    ----- Message ----- From: Baldwin Channing CROME Sent: 12/17/2023   9:00 AM EST To: Powell BRAVO Hanford, NP; Tiffany Feek  Good Morning Heather,   I have sent a letter to Daphne Satterfield, NP to see if it is ok for hem to hold his Eliquis  2 days prior. Once I get that back I can schedule hem . If there is someone else I need to send the blood thinner hold to please let me know. Thanks  CG ----- Message ----- From: Tommie Riggs Sent: 12/17/2023   7:11 AM EST To: Powell BRAVO Hanford, NP; Channing CROME Baldwin Metta Morning,  The biopsies go through [place], it looks like Channing has been working on this so I have added her so she can help.  Thanks, Tiffany ----- Message ----- From: Hanford Powell BRAVO, NP Sent: 12/16/2023   4:42 PM EST To: Sherrod Sherrod, MD; Riggs Tommie  Good afternoon. I saw this patient along with Dr. Sherrod last Thursday. 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 It doesn't look like this has been scheduled yet. I do think he has some trouble with reception where he lives. It is possible he has not heard his telephone. He also mentioned that it is ok to put appointment in MyChart, and he has family members who will make sure he is aware. Will you please reach out to him to have this done very soon? Thanks so much. Powell Hanford, NP

## 2023-12-18 NOTE — Assessment & Plan Note (Signed)
 Atrial fibrillation managed with high dose Toprol  (100 mg morning and 50 mg in the evening for rate control)  On Eliquis  5 mg twice daily for CVA prophylaxis. No missed doses or symptoms. - Continue Eliquis  as prescribed. -Consider potentially reducing to 50 mg twice daily Toprol  if would potentially allow for reinitiation of ARB.  Not currently on antiarrhythmic agents.

## 2023-12-18 NOTE — Assessment & Plan Note (Signed)
 Managed with methimazole. Asymptomatic. - Continue methimazole as prescribed.

## 2023-12-18 NOTE — Assessment & Plan Note (Signed)
 Listed as having Buerger's disease, but really does not walk enough to know if he has claudication.  Can discuss claudication more in follow-up and potentially consider checking ABIs.

## 2023-12-18 NOTE — Assessment & Plan Note (Signed)
 Chronic heart failure with EF ranging from 15% to as high as 25 to 30% in 2023 now back down to 25 to 30%.   With the exception of exertional dyspnea he seems to be pretty much asymptomatic.-NYHA class II AA  Exertional dyspnea is also likely related to obesity, deconditioning and long-term smoking.  GDMT imited by hypotension  - Continue Farxiga  10 mg daily, furosemide  20 Miller daily, spironolactone  12.5 mg, Toprol  XL 100 mg every morning and 50 mg every afternoon  - Will confer with the EP as to the benefit with him being on this high dose of beta-blocker.  Could potentially consider backing down on beta-blocker to allow for reinitiation of ARB.   - Advised weight monitoring for fluid retention, and maintain daily weights.. - Increase furosemide  if swelling or dyspnea occurs.  - Schedule follow-up in spring.

## 2023-12-19 ENCOUNTER — Telehealth: Payer: Self-pay | Admitting: Acute Care

## 2023-12-19 ENCOUNTER — Other Ambulatory Visit: Payer: Self-pay | Admitting: Acute Care

## 2023-12-19 ENCOUNTER — Telehealth: Payer: Self-pay

## 2023-12-19 NOTE — Telephone Encounter (Signed)
 I have called the patient again. He is still not scheduled for renal biopsy. I have called IR again to see if they can get him scheduled. He has follow up with Dr. Sherrod next week. He is on Eliquis , so will need a 48 hour wash out before procedure.

## 2023-12-19 NOTE — Telephone Encounter (Addendum)
   Pre-operative Risk Assessment    Patient Name: Henry Schneider  DOB: 1955/06/07 MRN: [id number]   Date of last office visit: 12/17/2023 with Dr. Anner Date of next office visit: none   Request for Surgical Clearance    Procedure:  CT RENAL BIOPSY   Date of Surgery:  Clearance TBD                                  Surgeon:  NOT LISTED Surgeon's Group or Practice Name: [facility]  Phone number:  [phone] Fax number:  [phone]   Type of Clearance Requested:   - Medical  - Pharmacy:  Hold Apixaban  (Eliquis ) for 2 days   Type of Anesthesia:  Not Indicated   Additional requests/questions:    [name]   12/19/2023, 4:38 PM

## 2023-12-22 ENCOUNTER — Other Ambulatory Visit

## 2023-12-22 DIAGNOSIS — Z716 Tobacco abuse counseling: Secondary | ICD-10-CM

## 2023-12-22 NOTE — Telephone Encounter (Signed)
Okay to hold

## 2023-12-22 NOTE — Progress Notes (Signed)
 12/22/2023 Name: Henry Schneider MRN: [id number] DOB: May 01, 1955  Chief Complaint  Patient presents with   Medication Management    Henry Schneider is a 69 y.o. year old male who was referred for medication management by their primary care provider, Billy Philippe SAUNDERS, NP. They presented for a telephone today.   They were referred to the pharmacist by their PCP for assistance in managing smoking cessation    Subjective:  Care Team: Primary Care Provider: Billy Philippe SAUNDERS, NP    Medication Access/Adherence  Current Pharmacy:  [phone] [place], [place] - [address] AT [facility] [address] [place] [address] Phone: [phone] Fax: [phone]  [facility] [address] [place] [address] Phone: [phone] Fax: [phone]   Patient reports affordability concerns with their medications: No  Patient reports access/transportation concerns to their pharmacy: No  Patient reports adherence concerns with their medications:  No     Tobacco Abuse: Current Med Therapy: Bupropion  SR 150mg   Tobacco Use History: Been smoking for 40 years Number of cigarettes per day: 1 pack per day (reports use to be 2 packs) Smokes first cigarette within 30 minutes after waking Triggers include driving in car and being in the habit  Quit Attempt History: Longest time ever been tobacco free 6 months Methods tried in the past include stopping cold turkey, using nicotine  gum, and chantix. 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 Last cigarette was on Friday    Objective:  Lab Results  Component Value Date   HGBA1C 6.7 (H) 10/28/2023    Lab Results  Component Value Date   CREATININE 1.12 12/11/2023   BUN 26 (H) 12/11/2023   NA 141 12/11/2023   K 5.0 12/11/2023    CL 104 12/11/2023   CO2 28 12/11/2023    Lab Results  Component Value Date   CHOL 111 10/28/2023   HDL 47.50 10/28/2023   LDLCALC 52 10/28/2023   TRIG 62.0 10/28/2023   CHOLHDL 2 10/28/2023    Medications Reviewed Today     Reviewed by Lionell Jon DEL, RPH (Pharmacist) on 12/22/23 at 1447  Med List Status: <None>   Medication Order Taking? Sig Documenting Provider Last Dose Status Informant  apixaban  (ELIQUIS ) 5 MG TABS tablet [id number]  Take 1 tablet (5 mg total) by mouth 2 (two) times daily. Jerilynn Daphne SAILOR, NP  Active   atorvastatin  (LIPITOR) 40 MG tablet [id number]  Take 1 tablet (40 mg total) by mouth daily. Jerilynn Daphne SAILOR, NP  Active   buPROPion  (WELLBUTRIN  SR) 150 MG 12 hr tablet [id number]  Take 1 tablet by mouth daily for 3 days, then increase to 1 tablet by mouth twice daily Williamson, Joanna R, NP  Active   dapagliflozin  propanediol (FARXIGA ) 10 MG TABS tablet [id number]  Take 1 tablet (10 mg total) by mouth daily. Jerilynn Daphne SAILOR, NP  Active   furosemide  (LASIX ) 20 MG tablet [id number]  Take 1 tablet (20 mg total) by mouth daily. Jerilynn Daphne SAILOR, NP  Active   methimazole  (TAPAZOLE ) 5 MG tablet [id number]  Take 1 tablet (5 mg total) by mouth daily. Billy Philippe SAUNDERS, NP  Active   metoprolol  succinate (TOPROL -XL) 50 MG 24 hr tablet [id number]  Take 2 tablets (100mg ) by mouth in the morning and 1 tablet (50mg ) in the afternoon.  Jerilynn Daphne SAILOR, NP  Active   spironolactone  (ALDACTONE ) 25 MG tablet [id number]  Take 0.5 tablets (12.5 mg total) by mouth daily. Jerilynn Daphne SAILOR, NP  Active               Assessment/Plan:   Tobacco Abuse - Currently uncontrolled, improving - Provided motivational interviewing to assess tobacco use and strategies for reduction - Provided information on 1 800 QUIT NOW support program - Continue Bupropion  SR 150mg  and reminded to use nicotine  4mg  gum as needed for breakthrough cravings if cough drops are not enough -  Counseled patient on quit techniques, patient still prefers to try to stop all cigarettes completely instead of trying to taper down -Counseled on techniques to help curb cravings such as avoiding triggers and keeping mind busy  Follow Up Plan: 3 WEKES  Jon VEAR Lindau, PharmD Clinical Pharmacist [phone]

## 2023-12-23 ENCOUNTER — Telehealth (HOSPITAL_BASED_OUTPATIENT_CLINIC_OR_DEPARTMENT_OTHER): Payer: Self-pay

## 2023-12-23 NOTE — Telephone Encounter (Signed)
   Pre-operative Risk Assessment    Patient Name: Henry Schneider  DOB: 1955-07-03 MRN: [id number]   Date of last office visit: 12/17/23 with Dr. Anner Date of next office visit: NA  Request for Surgical Clearance    Procedure:  Bilateral cataract surgery ( 01/06/24 and 01/20/24 )   Date of Surgery:  Clearance 01/06/24        and 01/20/24                          Surgeon:  NA Surgeon's Group or Practice Name:  [facility] Phone number:  [phone]  Fax number:  [phone]   Type of Clearance Requested:   - Medical  - Pharmacy:  Hold Apixaban  (Eliquis ) not indicated   Type of Anesthesia:  topical drops   Additional requests/questions:    Bonney Augustin JONETTA Delores   12/23/2023, 12:00 PM

## 2023-12-23 NOTE — Telephone Encounter (Signed)
   Patient Name: Henry Schneider  DOB: [birth year] MRN: [id number]  Primary Cardiologist: None  Chart reviewed as part of pre-operative protocol coverage. Cataract extractions are recognized in guidelines as low risk surgeries that do not typically require specific preoperative testing or holding of blood thinner therapy. Therefore, given past medical history and time since last visit, based on ACC/AHA guidelines, Henry Schneider would be at acceptable risk for the planned procedure without further cardiovascular testing.   I will route this recommendation to the requesting party via Epic fax function and remove from pre-op pool.  Please call with questions.  Damien JAYSON Braver, NP 12/23/2023, 12:41 PM

## 2023-12-23 NOTE — Telephone Encounter (Signed)
 Patient with diagnosis of A Fib on Eliquis  for anticoagulation.    Procedure: CT RENAL BIOPSY  Date of procedure: TBD   CHA2DS2-VASc Score = 7  This indicates a 11.2% annual risk of stroke. The patient's score is based upon: CHF History: 1 HTN History: 1 Diabetes History: 1 Stroke History: 2 Vascular Disease History: 1 Age Score: 1 Gender Score: 0     CrCl 114 ml/min Platelet count 172K  Patient has not had an Afib/aflutter ablation in the last 3 months, DCCV within the last 4 weeks or a watchman implanted in the last 45 days   Per office protocol, patient can hold Eliquis  for 2 days prior to procedure.   **This guidance is not considered finalized until pre-operative APP has relayed final recommendations.**

## 2023-12-23 NOTE — Telephone Encounter (Signed)
   Patient Name: Henry Schneider  DOB: 03-20-1955 MRN: [id number]  Primary Cardiologist: None  Chart reviewed as part of pre-operative protocol coverage.  Patient was last seen in the office on 12/17/2023 by Dr. Anner.  Per Dr. Anner, patient okay to proceed with biopsy.    Per office protocol, patient can hold Eliquis  for 2 days prior to procedure. Please resume Eliquis  as soon as possible postprocedure, at the discretion of the surgeon.   I will route this recommendation to the requesting party via Epic fax function and remove from pre-op pool.  Please call with questions.  Damien JAYSON Braver, NP 12/23/2023, 11:55 AM

## 2023-12-24 ENCOUNTER — Inpatient Hospital Stay (HOSPITAL_BASED_OUTPATIENT_CLINIC_OR_DEPARTMENT_OTHER): Admitting: Internal Medicine

## 2023-12-24 ENCOUNTER — Telehealth: Payer: Self-pay

## 2023-12-24 ENCOUNTER — Inpatient Hospital Stay: Admitting: Internal Medicine

## 2023-12-24 VITALS — BP 93/68 | HR 68 | Temp 97.6°F | Resp 17 | Ht 72.0 in | Wt 273.0 lb

## 2023-12-24 DIAGNOSIS — N2889 Other specified disorders of kidney and ureter: Secondary | ICD-10-CM | POA: Diagnosis not present

## 2023-12-24 NOTE — Telephone Encounter (Signed)
 Called Centralized Scheduling regarding the patient's previously missed CT renal biopsy. Scheduled the procedure for 01/26/2024 at 8:00 AM with a 6:00 AM arrival at [facility].  Instructed the patient to check in at the admitting desk, where staff will direct him to the appropriate area.  Provided pre-procedure instructions: Hold Eliquis  for 2 days prior to the biopsy. No food after midnight before the procedure. The patient may take blood pressure, heart, and seizure medications the morning of the procedure with a small sip of water.  Informed the patient that he will require a driver and 75-ynlm supervision following the biopsy. The patient verbalized understanding of all instructions.

## 2023-12-24 NOTE — Progress Notes (Signed)
 [facility] Telephone:([phone]   Fax:([phone]  OFFICE PROGRESS NOTE  Henry Philippe SAUNDERS, NP [address] [place] [address]  DIAGNOSIS: Highly suspicious metastatic renal cell carcinoma presented with right renal mass in addition to left pleural-based lung mass and multiple smaller nodular lesion in addition to metabolic activity in the right sacroiliac joint.  Pending tissue diagnosis  PRIOR THERAPY: None  CURRENT THERAPY: None  INTERVAL HISTORY: Henry Schneider 68 y.o. male returns to the clinic today for follow-up visit. Discussed the use of AI scribe software for clinical note transcription with the patient, who gave verbal consent to proceed.  History of Present Illness Henry Schneider is a 68 year old male with highly suspicious metastatic renal cell carcinoma who presents for follow-up regarding pending biopsy scheduling.  He has a history of highly suspicious metastatic renal cell carcinoma, presenting with a right renal mass, a left pleural-based lung mass, multiple smaller nodular lesions in the lung, and suspicious bone metastasis. He is awaiting a tissue diagnosis, which has been delayed due to communication issues with scheduling the biopsy. The first available date for the biopsy is December 23rd, but he has a conflict with a cataract surgery scheduled for the same day.  He experiences difficulty sleeping, stating he was 'up all night' and unable to sleep more than ten to fifteen minutes at a time, attributing this to worry about his upcoming appointments. No current nausea, vomiting, diarrhea, weight loss, or blood in his urine.  His past medical history includes a pacemaker placement approximately ten to twelve years ago following an 'air clot.' He also mentions experiencing some aphasia, which affects him occasionally.     MEDICAL HISTORY: Past Medical History:  Diagnosis Date   A-fib [facility])    Acute ischemic left middle  cerebral artery (MCA) stroke (HCC) 07/15/2023   aphasia   Aphasia due to acute cerebrovascular accident (CVA) [facility])    Buerger's disease    Cardiac LV ejection fraction 21-30% 07/12/2023   June 2020, EF 25-30%. while in 2016, EF was only 15%   Chronic combined systolic and diastolic CHF (congestive heart failure) (HCC)    Congestive heart failure (CHF) (HCC)    Diabetes mellitus without complication (HCC)    Emphysema of lung (HCC)    Grade I diastolic dysfunction    Graves disease    Heart disease    Hyperlipidemia    Hypertension    ICD (implantable cardioverter-defibrillator) in place, battery check 09-15-23    s/p [facility] CRT-D placement   LBBB (left bundle branch block)    Non-ischemic cardiomyopathy (HCC)    NSVT (nonsustained ventricular tachycardia) (HCC)    NYHA Class III cardiovascular function (HCC)    Obesity (BMI 30-39.9)    Peripheral vascular disease    Pulmonary embolism (HCC)    History of pulmonary emboli while on Eliquis ; A-fib   Stroke (HCC)    Syncope, cardiogenic    Systolic congestive heart failure with reduced left ventricular function, NYHA class 3 (HCC)    Thyroid  disease    Ventricular tachycardia (HCC)     ALLERGIES:  has no known allergies.  MEDICATIONS:  Current Outpatient Medications  Medication Sig Dispense Refill   apixaban  (ELIQUIS ) 5 MG TABS tablet Take 1 tablet (5 mg total) by mouth 2 (two) times daily. 60 tablet 0   atorvastatin  (LIPITOR) 40 MG tablet Take 1 tablet (40 mg total) by mouth daily. 30 tablet 0   buPROPion  (  WELLBUTRIN  SR) 150 MG 12 hr tablet Take 1 tablet by mouth daily for 3 days, then increase to 1 tablet by mouth twice daily 60 tablet 0   dapagliflozin  propanediol (FARXIGA ) 10 MG TABS tablet Take 1 tablet (10 mg total) by mouth daily. 30 tablet 0   furosemide  (LASIX ) 20 MG tablet Take 1 tablet (20 mg total) by mouth daily. 30 tablet 0   methimazole  (TAPAZOLE ) 5 MG tablet Take 1 tablet (5 mg total) by mouth daily.  90 tablet 0   metoprolol  succinate (TOPROL -XL) 50 MG 24 hr tablet Take 2 tablets (100mg ) by mouth in the morning and 1 tablet (50mg ) in the afternoon. 30 tablet 0   spironolactone  (ALDACTONE ) 25 MG tablet Take 0.5 tablets (12.5 mg total) by mouth daily. 30 tablet 0   No current facility-administered medications for this visit.    SURGICAL HISTORY:  Past Surgical History:  Procedure Laterality Date   IR CT HEAD LTD  07/12/2023   IR PERCUTANEOUS ART THROMBECTOMY/INFUSION INTRACRANIAL INC DIAG ANGIO  07/12/2023   IR US  GUIDE VASC ACCESS RIGHT  07/12/2023   RADIOLOGY WITH ANESTHESIA N/A 07/12/2023   Procedure: RADIOLOGY WITH ANESTHESIA;  Surgeon: Radiologist, Medication, MD;  Location: [facility] OR;  Service: Radiology;  Laterality: N/A;    REVIEW OF SYSTEMS:  Constitutional: positive for fatigue Eyes: negative Ears, nose, mouth, throat, and face: negative Respiratory: positive for dyspnea on exertion Cardiovascular: negative Gastrointestinal: negative Genitourinary:negative Integument/breast: negative Hematologic/lymphatic: negative Musculoskeletal:negative Neurological: negative Behavioral/Psych: negative Endocrine: negative Allergic/Immunologic: negative   PHYSICAL EXAMINATION: General appearance: alert, cooperative, fatigued, and no distress Head: Normocephalic, without obvious abnormality, atraumatic Neck: no adenopathy, no JVD, supple, symmetrical, trachea midline, and thyroid  not enlarged, symmetric, no tenderness/mass/nodules Lymph nodes: Cervical, supraclavicular, and axillary nodes normal. Resp: clear to auscultation bilaterally Back: symmetric, no curvature. ROM normal. No CVA tenderness. Cardio: regular rate and rhythm, S1, S2 normal, no murmur, click, rub or gallop GI: soft, non-tender; bowel sounds normal; no masses,  no organomegaly Extremities: extremities normal, atraumatic, no cyanosis or edema Neurologic: Alert and oriented X 3, normal strength and tone. Normal symmetric  reflexes. Normal coordination and gait  ECOG PERFORMANCE STATUS: 1 - Symptomatic but completely ambulatory  Blood pressure 93/68, pulse 68, temperature 97.6 F (36.4 C), temperature source Temporal, resp. rate 17, height 6' (1.829 m), weight 273 lb (123.8 kg), SpO2 97%.  LABORATORY DATA: Lab Results  Component Value Date   WBC 12.7 (H) 12/11/2023   HGB 17.2 (H) 12/11/2023   HCT 52.6 (H) 12/11/2023   MCV 94.8 12/11/2023   PLT 172 12/11/2023      Chemistry      Component Value Date/Time   NA 141 12/11/2023 1558   K 5.0 12/11/2023 1558   CL 104 12/11/2023 1558   CO2 28 12/11/2023 1558   BUN 26 (H) 12/11/2023 1558   CREATININE 1.12 12/11/2023 1558      Component Value Date/Time   CALCIUM  9.8 12/11/2023 1558   ALKPHOS 89 12/11/2023 1558   AST 33 12/11/2023 1558   ALT 38 12/11/2023 1558   BILITOT 0.7 12/11/2023 1558       RADIOGRAPHIC STUDIES: CUP PACEART INCLINIC DEVICE CHECK Result Date: 12/09/2023 Normal in-clinic CRT-D (multi-lead) check. Presenting Rhythm: AS/BP 73 w/ PVC's. Routine testing was performed. Thresholds, sensing, impedance trend were stable and no changes were required. HF diagnostics are stable. No treated arrhythmias. Brief NSVT episodes noted longest duration 14 seconds. Patient BiV pacing 92% of the time. Estimated longevity 2 years. Pt  enrolled in remote follow-up. Patient has GORE lead. Impedance stable at this time. Patient educated and received magnet for shock plan containing device clinic number. Patient verbalized understanding. Programming changes: - Atrial, RV & LV outputs programmed from 2.5V @ 0.90ms to 2.0V @ 0.49ms.Rozelle Banter, BSN, RN  NM PET Image Initial (PI) Skull Base To Thigh Result Date: 12/05/2023 EXAM: PET AND CT SKULL BASE TO MID THIGH 12/03/2023 02:39:23 PM TECHNIQUE: RADIOPHARMACEUTICAL: 11.83 mCi F-18 FDG Uptake time 60 minutes. Glucose level 84 mg/dl. Blood pool SUV 2.6. PET imaging was acquired from the base of the skull to the  mid thighs. Non-contrast enhanced computed tomography was obtained for attenuation correction and anatomic localization. COMPARISON: Chest CT 11/13/2023. CLINICAL HISTORY: Lung nodule, > 8mm. FINDINGS: HEAD AND NECK: Hypoactivity in the left temporal lobe compatible with known prior CVA. Presumed physiologic tonsillar activity with maximum SUV 7.0 on the left and 6.8 on the right in the palatine tonsils. No metabolically active cervical lymphadenopathy. CHEST: The pleural base mass posteriorly along the left lower lobe measures 4.0 x 2.5 cm on image 80 series 6 with maximum SUV 8.1 suspicious for malignancy. A smaller pleural base nodule anterior to the left upper lobe measures 0.6 cm in thickness on image 51 series 6 with maximum SUV 3.4. A pleural based nodule along the left upper lobe mediastinal margin adjacent to the aortic arch on image 52 series 6 measures 0.7 cm in thickness and has a maximum SUV of 4.6 likewise suspicious. AP window lymph node 1.1 cm short axis on image 57 series 6 with maximum SUV 6.1, suspicious for malignancy. Cardiomegaly. Thoracic aortic, coronary artery, and branch vessel atheromatous vascular calcifications. ABDOMEN AND PELVIS: Subtle accentuated activity along the anterior superior spleen with maximum SUV 5.1 compared to typical background splenic activity elsewhere of 3.0. Significance uncertain. Hypermetabolic 8.2 cm mass of the right kidney lower pole maximum SUV 11.9, high suspicion for renal cell carcinoma or less likely a metastatic lesion. Systemic atherosclerosis is present, including the aorta and iliac arteries. Mild sigmoid colon diverticulosis. Physiologic activity within the gastrointestinal and genitourinary systems. No metabolically active lymphadenopathy. BONES AND SOFT TISSUE: Small focus of accentuated metabolic activity along the iliac side of the right sacroiliac joint with maximum SUV 4.7, nonspecific for early metastatic disease versus degenerative finding.  IMPRESSION: 1. Hypermetabolic 8.2 cm right lower pole renal mass, highly suspicious for renal cell carcinoma or less likely metastasis. 2. Left lower lobe pleural-based mass (4.0 x 2.5 cm, SUV 8.1), suspicious for malignancy. 3. Left upper lobe pleural-based nodule (0.7 cm, SUV 4.6), suspicious for malignancy. 4. AP window lymph node (1.1 cm short axis, SUV 6.1), suspicious for malignancy. 5. Small hypermetabolic focus along the iliac side of the right sacroiliac joint (SUV 4.7), indeterminate for early metastatic disease versus degenerative change. 6. Atherosclerosis and cardiomegaly. Electronically signed by: Ryan Salvage MD 12/05/2023 01:49 PM EST RP Workstation: [id number]    ASSESSMENT AND PLAN: This is a very pleasant 68 years old white male with Highly suspicious metastatic renal cell carcinoma presented with right renal mass in addition to left pleural-based lung mass and multiple smaller nodular lesion in addition to metabolic activity in the right sacroiliac joint.  Pending tissue diagnosis Assessment and Plan Assessment & Plan Suspicious metastatic renal cell carcinoma (pending tissue diagnosis) Highly suspicious metastatic renal cell carcinoma with a right renal mass, left pleural-based lung mass, multiple smaller nodular lung lesions, and suspicious bone metastasis. Tissue diagnosis is pending. He has not yet undergone biopsy  due to scheduling issues. He reports insomnia, likely due to anxiety about the condition. No current nausea, vomiting, diarrhea, weight loss, or hematuria. - Scheduled biopsy of the kidney mass for December 23rd. - Contacted pulmonologist Lauraine Lites, NP to expedite lung mass biopsy.  He has an appointment with her on December 29, 2023 at 1 PM - Advised him to reschedule cataract surgery to accommodate biopsy appointments.  He was advised to call immediately if he has any other concerning symptoms in the interval. The patient voices understanding of current disease  status and treatment options and is in agreement with the current care plan.  All questions were answered. The patient knows to call the clinic with any problems, questions or concerns. We can certainly see the patient much sooner if necessary.  The total time spent in the appointment was 30 minutes including review of chart and various tests results, discussions about plan of care and coordination of care plan .   Disclaimer: This note was dictated with voice recognition software. Similar sounding words can inadvertently be transcribed and may not be corrected upon review.

## 2023-12-27 ENCOUNTER — Other Ambulatory Visit: Payer: Self-pay | Admitting: Family Medicine

## 2023-12-28 NOTE — Progress Notes (Unsigned)
 Henry Console, PA-C [address] [place], [place]  [address] Phone: [phone]   Gastroenterology Consultation  Referring Provider:     Billy Philippe SAUNDERS, NP Primary Care Physician:  Billy Philippe SAUNDERS, NP Primary Gastroenterologist:  Henry Console, PA-C / Glendia Holt, MD  Reason for Consultation:     Discuss repeat colonoscopy; history of colon polyps        HPI:   Discussed the use of AI scribe software for clinical note transcription with the patient, who gave verbal consent to proceed.  Patient saw previous gastroenterologist in [place] .  Recently moved here and is establishing GI care.  08/2015 colonoscopy in [place] : 1 small 6 mm tubular adenoma polyp removed from ascending colon.  Good prep with Suprep.  Moderate sigmoid diverticulosis.  2012 colonoscopy: 1.5 cm sessile serrated polyp and 2 other small adenomatous polyps removed.  3-year repeat.  Family history: Maternal cousin diagnosed with colon cancer age 62.  Father and brother had bladder cancer.  History of Present Illness 68 year old male was referred by his primary care physician to discuss scheduling a repeat colonoscopy due to history of colon polyps.  Patient denies any GI symptoms such as abdominal pain, heartburn, dysphagia, diarrhea, constipation, melena, hematochezia, or weight loss.    He is currently on Eliquis . He has been experiencing other health issues, including a stroke in June 2025, and is scheduled for a kidney biopsy and a lung biopsy to evaluate kidney mass and lung masses. His last echocardiogram in June 2025 showed a decreased ejection fraction of 25-30%.    He experiences shortness of breath, but does not use an inhaler or oxygen.  He has moderate memory impairment and aphasia.  Does not have any mobility issues.  Walks normally with no assistive devices.  Probably because of expressive aphasia, he is a poor historian.  Does not seem to know much about his history.  I  am able to view his medical history in the chart in Care Everywhere.  PMH: Pulmonary emboli, A-fib, CVA with aphasia, CHF, DVT, diabetes, emphysema, PVD, cardiomyopathy, Buerger's disease, Graves' disease, hypertension, internal defibrillator, NSVT, tobacco use disorder (45 years current smoker), obesity.  Currently on Eliquis .  06/2023 echocardiogram showed decreased LVEF 25 to 30%.  Cardiologist Dr. Anner (last follow-up visit 12/17/2023).  Pulmonologist Dr. Shelah.  CVA in June 2025.  Symptom was mostly related to speech (expressive aphasia).  Still having issues poststroke.  Apparently had missed doses of Eliquis  around the time of his stroke. PET scan 11/2023 new pleural mass noted on CT scan as of November-pending oncology evaluation.  Saw pulmonology 12/15/2023.  PET scan positive for hypermetabolic left pleural mass 4.0 x 2.5 cm suspicious for malignancy.  Also multiple smaller nodular bilobular hypermetabolic densities. PET scan 11/2023 showed 8.2 cm hypermetabolic right kidney mass suspicious for renal cell carcinoma.  Socially, he lives alone. He is unmarried. He has no children. Does have a niece that lives in [place]  who is an [employer]. The remainder of his family live in [place] . States he currently does not work. He is a music therapist by trade. Has not been able to work for some time due to overall health condition.   Past Medical History:  Diagnosis Date   A-fib [facility])    Acute ischemic left middle cerebral artery (MCA) stroke (HCC) 07/15/2023   aphasia   Aphasia due to acute cerebrovascular accident (CVA) (HCC)    Buerger's disease    Cardiac LV ejection  fraction 21-30% 07/12/2023   June 2020, EF 25-30%. while in 2016, EF was only 15%   Chronic combined systolic and diastolic CHF (congestive heart failure) (HCC)    Congestive heart failure (CHF) (HCC)    Diabetes mellitus without complication (HCC)    Emphysema of lung (HCC)    Grade I diastolic dysfunction    Graves  disease    Heart disease    Hyperlipidemia    Hypertension    ICD (implantable cardioverter-defibrillator) in place, battery check 09-15-23    s/p [facility] CRT-D placement   LBBB (left bundle branch block)    Non-ischemic cardiomyopathy (HCC)    NSVT (nonsustained ventricular tachycardia) (HCC)    NYHA Class III cardiovascular function (HCC)    Obesity (BMI 30-39.9)    Peripheral vascular disease    Pulmonary embolism (HCC)    History of pulmonary emboli while on Eliquis ; A-fib   Stroke (HCC)    Syncope, cardiogenic    Systolic congestive heart failure with reduced left ventricular function, NYHA class 3 (HCC)    Thyroid  disease    Ventricular tachycardia [facility])     Past Surgical History:  Procedure Laterality Date   IR CT HEAD LTD  07/12/2023   IR PERCUTANEOUS ART THROMBECTOMY/INFUSION INTRACRANIAL INC DIAG ANGIO  07/12/2023   IR US  GUIDE VASC ACCESS RIGHT  07/12/2023   RADIOLOGY WITH ANESTHESIA N/A 07/12/2023   Procedure: RADIOLOGY WITH ANESTHESIA;  Surgeon: Radiologist, Medication, MD;  Location: [facility] OR;  Service: Radiology;  Laterality: N/A;    Prior to Admission medications   Medication Sig Start Date End Date Taking? Authorizing Provider  apixaban  (ELIQUIS ) 5 MG TABS tablet Take 1 tablet (5 mg total) by mouth 2 (two) times daily. 07/18/23   Jerilynn Daphne SAILOR, NP  atorvastatin  (LIPITOR) 40 MG tablet Take 1 tablet (40 mg total) by mouth daily. 07/18/23   Jerilynn Daphne SAILOR, NP  buPROPion  (WELLBUTRIN  SR) 150 MG 12 hr tablet TAKE 1 TABLET BY MOUTH DAILY FOR 3 DAYS, THEN INCREASE TO 1 TABLET BY MOUTH TWICE DAILY 12/28/23   Williamson, Joanna R, NP  dapagliflozin  propanediol (FARXIGA ) 10 MG TABS tablet Take 1 tablet (10 mg total) by mouth daily. 07/18/23   Jerilynn Daphne SAILOR, NP  furosemide  (LASIX ) 20 MG tablet Take 1 tablet (20 mg total) by mouth daily. 07/18/23   Jerilynn Daphne SAILOR, NP  methimazole  (TAPAZOLE ) 5 MG tablet Take 1 tablet (5 mg total) by mouth daily. 11/04/23    Williamson, Joanna R, NP  metoprolol  succinate (TOPROL -XL) 50 MG 24 hr tablet Take 2 tablets (100mg ) by mouth in the morning and 1 tablet (50mg ) in the afternoon. 07/18/23   Jerilynn Daphne SAILOR, NP  spironolactone  (ALDACTONE ) 25 MG tablet Take 0.5 tablets (12.5 mg total) by mouth daily. 07/18/23   Jerilynn Daphne SAILOR, NP    Family History  Problem Relation Age of Onset   Coronary artery disease Mother    Coronary artery disease Father    Cancer Brother      Social History   Tobacco Use   Smoking status: Former    Current packs/day: 0.00    Types: Cigarettes    Quit date: 12/12/2023    Years since quitting: 0.0  Vaping Use   Vaping status: Never Used  Substance Use Topics   Alcohol use: Not Currently    Comment: Once a month   Drug use: Yes    Comment: CBD gummies    Allergies as of 12/29/2023   (No Known  Allergies)    Review of Systems:    All systems reviewed and negative except where noted in HPI.   Physical Exam:  BP 108/68 (BP Location: Left Arm, Patient Position: Sitting, Cuff Size: Normal)   Pulse 63   Ht 6' 3 (1.905 m)   Wt 272 lb 2 oz (123.4 kg)   BMI 34.01 kg/m  No LMP for male patient.  General:   Alert,  Well-developed, well-nourished, pleasant and cooperative in NAD Lungs:  Respirations even and unlabored.  Clear throughout to auscultation.   No wheezes, crackles, or rhonchi. No acute distress. Heart:  Regular rate and rhythm; no murmurs, clicks, rubs, or gallops. Abdomen:  Normal bowel sounds.  No bruits.  Soft, and non-distended without masses, hepatosplenomegaly or hernias noted.  No Tenderness.  No guarding or rebound tenderness.    Neurologic:  Moderate Expressive Aphasia.  Walks with normal gait.  No extremity weakness.  Is able to get on and off exam table with no difficulty. Psych:  Alert and cooperative. Normal mood and affect.   Imaging Studies: CUP PACEART INCLINIC DEVICE CHECK Result Date: 12/09/2023 Normal in-clinic CRT-D (multi-lead)  check. Presenting Rhythm: AS/BP 73 w/ PVC's. Routine testing was performed. Thresholds, sensing, impedance trend were stable and no changes were required. HF diagnostics are stable. No treated arrhythmias. Brief NSVT episodes noted longest duration 14 seconds. Patient BiV pacing 92% of the time. Estimated longevity 2 years. Pt enrolled in remote follow-up. Patient has GORE lead. Impedance stable at this time. Patient educated and received magnet for shock plan containing device clinic number. Patient verbalized understanding. Programming changes: - Atrial, RV & LV outputs programmed from 2.5V @ 0.6ms to 2.0V @ 0.55ms.Rozelle Banter, BSN, RN  NM PET Image Initial (PI) Skull Base To Thigh Result Date: 12/05/2023 EXAM: PET AND CT SKULL BASE TO MID THIGH 12/03/2023 02:39:23 PM TECHNIQUE: RADIOPHARMACEUTICAL: 11.83 mCi F-18 FDG Uptake time 60 minutes. Glucose level 84 mg/dl. Blood pool SUV 2.6. PET imaging was acquired from the base of the skull to the mid thighs. Non-contrast enhanced computed tomography was obtained for attenuation correction and anatomic localization. COMPARISON: Chest CT 11/13/2023. CLINICAL HISTORY: Lung nodule, > 8mm. FINDINGS: HEAD AND NECK: Hypoactivity in the left temporal lobe compatible with known prior CVA. Presumed physiologic tonsillar activity with maximum SUV 7.0 on the left and 6.8 on the right in the palatine tonsils. No metabolically active cervical lymphadenopathy. CHEST: The pleural base mass posteriorly along the left lower lobe measures 4.0 x 2.5 cm on image 80 series 6 with maximum SUV 8.1 suspicious for malignancy. A smaller pleural base nodule anterior to the left upper lobe measures 0.6 cm in thickness on image 51 series 6 with maximum SUV 3.4. A pleural based nodule along the left upper lobe mediastinal margin adjacent to the aortic arch on image 52 series 6 measures 0.7 cm in thickness and has a maximum SUV of 4.6 likewise suspicious. AP window lymph node 1.1 cm short axis  on image 57 series 6 with maximum SUV 6.1, suspicious for malignancy. Cardiomegaly. Thoracic aortic, coronary artery, and branch vessel atheromatous vascular calcifications. ABDOMEN AND PELVIS: Subtle accentuated activity along the anterior superior spleen with maximum SUV 5.1 compared to typical background splenic activity elsewhere of 3.0. Significance uncertain. Hypermetabolic 8.2 cm mass of the right kidney lower pole maximum SUV 11.9, high suspicion for renal cell carcinoma or less likely a metastatic lesion. Systemic atherosclerosis is present, including the aorta and iliac arteries. Mild sigmoid colon diverticulosis. Physiologic activity  within the gastrointestinal and genitourinary systems. No metabolically active lymphadenopathy. BONES AND SOFT TISSUE: Small focus of accentuated metabolic activity along the iliac side of the right sacroiliac joint with maximum SUV 4.7, nonspecific for early metastatic disease versus degenerative finding. IMPRESSION: 1. Hypermetabolic 8.2 cm right lower pole renal mass, highly suspicious for renal cell carcinoma or less likely metastasis. 2. Left lower lobe pleural-based mass (4.0 x 2.5 cm, SUV 8.1), suspicious for malignancy. 3. Left upper lobe pleural-based nodule (0.7 cm, SUV 4.6), suspicious for malignancy. 4. AP window lymph node (1.1 cm short axis, SUV 6.1), suspicious for malignancy. 5. Small hypermetabolic focus along the iliac side of the right sacroiliac joint (SUV 4.7), indeterminate for early metastatic disease versus degenerative change. 6. Atherosclerosis and cardiomegaly. Electronically signed by: Ryan Salvage MD 12/05/2023 01:49 PM EST RP Workstation: [id number]    Labs: CBC    Component Value Date/Time   WBC 12.7 (H) 12/11/2023 1558   WBC 11.8 (H) 11/11/2023 1024   RBC 5.55 12/11/2023 1558   HGB 17.2 (H) 12/11/2023 1558   HCT 52.6 (H) 12/11/2023 1558   PLT 172 12/11/2023 1558   MCV 94.8 12/11/2023 1558    CMP     Component Value  Date/Time   NA 141 12/11/2023 1558   K 5.0 12/11/2023 1558   CL 104 12/11/2023 1558   CO2 28 12/11/2023 1558   GLUCOSE 98 12/11/2023 1558   BUN 26 (H) 12/11/2023 1558   CREATININE 1.12 12/11/2023 1558   CALCIUM  9.8 12/11/2023 1558   PROT 7.0 12/11/2023 1558   ALBUMIN 3.9 12/11/2023 1558   AST 33 12/11/2023 1558   ALT 38 12/11/2023 1558   ALKPHOS 89 12/11/2023 1558   BILITOT 0.7 12/11/2023 1558   GFRNONAA >60 12/11/2023 1558   GFRAA >60 12/20/2014 0235    Assessment and Plan:   Olyn Landstrom is a 68 y.o. y/o male has been referred for:  1.  History of colon polyps:  Last colonoscopy in 2017 showed 1 small 6 mm tubular adenoma polyp removed.  Colonoscopy in 2012 showed 1.5 cm sessile serrated polyp and 2 smaller tubular adenoma polyps removed. - Recent PET scan showed NO hypermetabolic activity in the colon. - Recommend postpone scheduling screening colonoscopy until after he has had his kidney and lung biopsies completed.  He needs kidney mass and lung masses evaluated as a first priority.  Patient agrees with this plan. - Return for followup in our office in 3 months with MD to further decide about repeat colonoscopy for surveillance of colon polyps.  Procedure would need to be scheduled in hospital due to CHF with decreased LVEF 25 - 30%.  2.  Comorbidities: Pulmonary emboli, A-fib, CVA with aphasia, CHF, DVT, diabetes, emphysema, PVD, cardiomyopathy, Buerger's disease, Graves' disease, hypertension, internal defibrillator, NSVT, tobacco use disorder.  Currently on Eliquis .  06/2023 echocardiogram showed decreased LVEF 25 to 30%.  3.  Lung masses seen on PET/CT scan 12/04/2023 concerning for lung cancer or metastatic disease. - Currently undergoing oncology and pulmonology evaluation.  4.  8.2 cm right kidney mass on PET CT 12/04/2023, highly suspicious for renal cell carcinoma - Currently undergoing oncology evaluation. - He is scheduled for kidney biopsy 01/26/2024.   Follow up  in 3 months with MD to decide about repeat Colonoscopy.  Henry Console, PA-C

## 2023-12-29 ENCOUNTER — Encounter: Payer: Self-pay | Admitting: Physician Assistant

## 2023-12-29 ENCOUNTER — Ambulatory Visit (INDEPENDENT_AMBULATORY_CARE_PROVIDER_SITE_OTHER): Admitting: Acute Care

## 2023-12-29 ENCOUNTER — Encounter: Payer: Self-pay | Admitting: Emergency Medicine

## 2023-12-29 ENCOUNTER — Ambulatory Visit: Admitting: Physician Assistant

## 2023-12-29 ENCOUNTER — Telehealth: Payer: Self-pay | Admitting: Acute Care

## 2023-12-29 ENCOUNTER — Encounter: Payer: Self-pay | Admitting: Acute Care

## 2023-12-29 VITALS — BP 108/68 | HR 63 | Ht 75.0 in | Wt 272.1 lb

## 2023-12-29 VITALS — BP 128/64 | HR 69 | Temp 97.4°F | Ht 75.0 in | Wt 273.2 lb

## 2023-12-29 DIAGNOSIS — R9389 Abnormal findings on diagnostic imaging of other specified body structures: Secondary | ICD-10-CM

## 2023-12-29 DIAGNOSIS — Z87891 Personal history of nicotine dependence: Secondary | ICD-10-CM | POA: Diagnosis not present

## 2023-12-29 DIAGNOSIS — Z860100 Personal history of colon polyps, unspecified: Secondary | ICD-10-CM

## 2023-12-29 DIAGNOSIS — Z7901 Long term (current) use of anticoagulants: Secondary | ICD-10-CM | POA: Diagnosis not present

## 2023-12-29 DIAGNOSIS — R918 Other nonspecific abnormal finding of lung field: Secondary | ICD-10-CM | POA: Diagnosis not present

## 2023-12-29 DIAGNOSIS — R942 Abnormal results of pulmonary function studies: Secondary | ICD-10-CM

## 2023-12-29 DIAGNOSIS — N2889 Other specified disorders of kidney and ureter: Secondary | ICD-10-CM | POA: Diagnosis not present

## 2023-12-29 NOTE — Telephone Encounter (Signed)
 Bronch Letter given by Lauraine Lites Case# [id number] will send to [facility]

## 2023-12-29 NOTE — Progress Notes (Signed)
 History of Present Illness Henry Schneider is a 68 y.o. male current every day smoker followed through the lung cancer screening program , referred for an abnormal LDCT ( 4 B), 10/2023.He will be followed by Dr. Shelah and Lauraine Lites NP  Synopsis Pt. Had a  lung cancer screening scan done 11/13/2023. Scan was read as a 4 B, there was a  new pleural mass in the posterior lower left hemithorax. PET scan done to further evaluate the finding. PET scan done 12/03/2023 confirmed a left lower lobe pleural based mass with an SUV of 8.1, suspicious for malignancy.There was also a  smaller pleural base nodule anterior to the left upper lobe measures 0.6 cm  with maximum SUV 3.4, along with several other nodules suspicious for malignancy. There were also hypermetabolic lymph nodes, as well as a large right renal mass measuring 8.2 cm , with an SUV of 11.9, also highly suspicious for malignancy.  Pt. Was seen by Dr. Sherrod who ordered biopsy of the right renal mass. This procedure, however , cannot be done until 01/20/2024. Therefore patient has been referred for consideration of bronchoscopy with biopsy for tissue diagnosis to be done  sooner than the biopsy of the renal mass can be scheduled.   12/29/2023 Discussed the use of AI scribe software for clinical note transcription with the patient, who gave verbal consent to proceed.  History of Present Illness Patient presents for consideration of bronchoscopy with biopsies of a 4 x 2.5 cm left lower lobe mass with an SUV of 8.1 on PET.  There are several additional lung nodules as well as lymph nodes that are concerning for malignancy.  Patient is aware of the findings of his diagnostic testing.  He had a previous stroke that has left him with an expressive  aphasia.  He also has a hard time using the phone.  We brought him in today to get his bronchoscopy scheduled, to allow us  to place a letter in his hand to ensure he has good understanding of the risks  involved in the procedure and when to be awhere for the procedure.  Patient has been scheduled for bronchoscopy with biopsy, and EBUS on 01/06/2024.  Time of the procedure is 12:55 PM with an arrival time for prep of 10:25 AM.  Patient has not had CT imaging in over a month therefore super D CT chest to been ordered for 01/01/2024 at 8:45 AM at [facility].  Patient has been provided paperwork with all of the above noted and highlighted for his convenience.  We discussed that he will stop his Eliquis  after the dose on January 03, 2024.  He understands that he will not take Eliquis  on 12 7, 12 8, or 12 9 the day of the procedure.  He verbalized understanding that where he will not eat or drink anything after midnight with the exception of medications that are necessary.  He will follow-up with me postprocedure to review the results on December 15 at 10 AM at the [facility].  He confirmed for me today he does have a friend who will drive him to the procedure, stay with him during the procedure, and drive him home from the procedure.  She will also monitor him for 24 hours postprocedure.  Patient gave informed consent to move forward with the procedure.  We discussed the risks of bleeding, infection, pneumothorax, and adverse reaction to anesthesia.  He had no further questions at completion of the visit.  He is  able to communicate well through MyChart and understands he can message us  if he has any further questions regarding this procedure.  He did have some conflicts on December night with the bronchoscopy but he has had them rescheduled as he understands that this is the most important procedure at present time.  We did discuss the importance of getting tissue diagnosis so that we can get him treated.  Patient states he quit smoking on 12/12/2023.  I have congratulated him and encouraged him to continue working hard on smoking cessation.  He understands that overall outcomes and treatment are  improved when patients are no longer smoking.     Test Results: PET Scan 12/04/2023 Hypermetabolic 8.2 cm right lower pole renal mass, highly suspicious for renal cell carcinoma or less likely metastasis. 2. Left lower lobe pleural-based mass (4.0 x 2.5 cm, SUV 8.1), suspicious for malignancy. 3. Left upper lobe pleural-based nodule (0.7 cm, SUV 4.6), suspicious for malignancy. 4. AP window lymph node (1.1 cm short axis, SUV 6.1), suspicious for malignancy. 5. Small hypermetabolic focus along the iliac side of the right sacroiliac joint (SUV 4.7), indeterminate for early metastatic disease versus degenerative change. 6. Atherosclerosis and cardiomegaly.   LDCT Chest 11/13/2023 New pleural mass in the posterior lower left hemithorax. Lung-RADS 4B, suspicious. Additional imaging evaluation or consultation with Pulmonology or Thoracic Surgery recommended. These results will be called to the ordering clinician or representative by the Radiologist Assistant, and communication documented in the PACS or [employer]. 2. Aortic atherosclerosis (ICD10-I70.0). Coronary artery calcification. 3. Enlarged pulmonic trunk, indicative of pulmonary arterial hypertension. 4.  Emphysema (ICD10-J43.9).   MRI Brain 06/2023 Brain: Acute left MCA territory infarcts including acute infarcts in left basal ganglia, left insula, overlying left frontal, parietal and temporal lobes. Associated edema with trace (1-2 mm) rightward midline shift. No evidence of acute hemorrhage, mass lesion, or hydrocephalus. Patchy T2/FLAIR hyperintensities in the white matter, compatible with chronic microvascular disease.   Vascular: Major arterial flow voids are maintained at the skull base.   Skull and upper cervical spine: Normal marrow signal.   Sinuses/Orbits: Mild paranasal sinus mucosal thickening. No acute orbital findings.   IMPRESSION: Acute left MCA territory infarcts with trace (1-2 mm)  rightward midline shift.    Latest Ref Rng & Units 12/11/2023    3:58 PM 11/11/2023   10:24 AM 10/28/2023    9:14 AM  CBC  WBC 4.0 - 10.5 K/uL 12.7  11.8  11.7   Hemoglobin 13.0 - 17.0 g/dL 82.7  82.8  83.6   Hematocrit 39.0 - 52.0 % 52.6  52.6  49.9   Platelets 150 - 400 K/uL 172  177.0  177.0        Latest Ref Rng & Units 12/11/2023    3:58 PM 10/28/2023    9:14 AM 07/17/2023    4:59 AM  BMP  Glucose 70 - 99 mg/dL 98  892  98   BUN 8 - 23 mg/dL 26  24  13    Creatinine 0.61 - 1.24 mg/dL 8.87  8.89  8.87   Sodium 135 - 145 mmol/L 141  141  139   Potassium 3.5 - 5.1 mmol/L 5.0  4.3  4.4   Chloride 98 - 111 mmol/L 104  107  111   CO2 22 - 32 mmol/L 28  26  25    Calcium  8.9 - 10.3 mg/dL 9.8  9.8  8.8     BNP    Component Value Date/Time   BNP  794.0 (H) 12/19/2014 0636    ProBNP No results found for: PROBNP  PFT No results found for: FEV1PRE, FEV1POST, FVCPRE, FVCPOST, TLC, DLCOUNC, PREFEV1FVCRT, PSTFEV1FVCRT  CUP PACEART INCLINIC DEVICE CHECK Result Date: 12/09/2023 Normal in-clinic CRT-D (multi-lead) check. Presenting Rhythm: AS/BP 73 w/ PVC's. Routine testing was performed. Thresholds, sensing, impedance trend were stable and no changes were required. HF diagnostics are stable. No treated arrhythmias. Brief NSVT episodes noted longest duration 14 seconds. Patient BiV pacing 92% of the time. Estimated longevity 2 years. Pt enrolled in remote follow-up. Patient has GORE lead. Impedance stable at this time. Patient educated and received magnet for shock plan containing device clinic number. Patient verbalized understanding. Programming changes: - Atrial, RV & LV outputs programmed from 2.5V @ 0.48ms to 2.0V @ 0.63ms.Rozelle Banter, BSN, RN  NM PET Image Initial (PI) Skull Base To Thigh Result Date: 12/05/2023 EXAM: PET AND CT SKULL BASE TO MID THIGH 12/03/2023 02:39:23 PM TECHNIQUE: RADIOPHARMACEUTICAL: 11.83 mCi F-18 FDG Uptake time 60 minutes. Glucose level  84 mg/dl. Blood pool SUV 2.6. PET imaging was acquired from the base of the skull to the mid thighs. Non-contrast enhanced computed tomography was obtained for attenuation correction and anatomic localization. COMPARISON: Chest CT 11/13/2023. CLINICAL HISTORY: Lung nodule, > 8mm. FINDINGS: HEAD AND NECK: Hypoactivity in the left temporal lobe compatible with known prior CVA. Presumed physiologic tonsillar activity with maximum SUV 7.0 on the left and 6.8 on the right in the palatine tonsils. No metabolically active cervical lymphadenopathy. CHEST: The pleural base mass posteriorly along the left lower lobe measures 4.0 x 2.5 cm on image 80 series 6 with maximum SUV 8.1 suspicious for malignancy. A smaller pleural base nodule anterior to the left upper lobe measures 0.6 cm in thickness on image 51 series 6 with maximum SUV 3.4. A pleural based nodule along the left upper lobe mediastinal margin adjacent to the aortic arch on image 52 series 6 measures 0.7 cm in thickness and has a maximum SUV of 4.6 likewise suspicious. AP window lymph node 1.1 cm short axis on image 57 series 6 with maximum SUV 6.1, suspicious for malignancy. Cardiomegaly. Thoracic aortic, coronary artery, and branch vessel atheromatous vascular calcifications. ABDOMEN AND PELVIS: Subtle accentuated activity along the anterior superior spleen with maximum SUV 5.1 compared to typical background splenic activity elsewhere of 3.0. Significance uncertain. Hypermetabolic 8.2 cm mass of the right kidney lower pole maximum SUV 11.9, high suspicion for renal cell carcinoma or less likely a metastatic lesion. Systemic atherosclerosis is present, including the aorta and iliac arteries. Mild sigmoid colon diverticulosis. Physiologic activity within the gastrointestinal and genitourinary systems. No metabolically active lymphadenopathy. BONES AND SOFT TISSUE: Small focus of accentuated metabolic activity along the iliac side of the right sacroiliac joint with  maximum SUV 4.7, nonspecific for early metastatic disease versus degenerative finding. IMPRESSION: 1. Hypermetabolic 8.2 cm right lower pole renal mass, highly suspicious for renal cell carcinoma or less likely metastasis. 2. Left lower lobe pleural-based mass (4.0 x 2.5 cm, SUV 8.1), suspicious for malignancy. 3. Left upper lobe pleural-based nodule (0.7 cm, SUV 4.6), suspicious for malignancy. 4. AP window lymph node (1.1 cm short axis, SUV 6.1), suspicious for malignancy. 5. Small hypermetabolic focus along the iliac side of the right sacroiliac joint (SUV 4.7), indeterminate for early metastatic disease versus degenerative change. 6. Atherosclerosis and cardiomegaly. Electronically signed by: Ryan Salvage MD 12/05/2023 01:49 PM EST RP Workstation: [id number]     Past medical hx Past Medical History:  Diagnosis Date  A-fib [facility])    Acute ischemic left middle cerebral artery (MCA) stroke (HCC) 07/15/2023   aphasia   Aphasia due to acute cerebrovascular accident (CVA) (HCC)    Buerger's disease    Cardiac LV ejection fraction 21-30% 07/12/2023   June 2020, EF 25-30%. while in 2016, EF was only 15%   Chronic combined systolic and diastolic CHF (congestive heart failure) (HCC)    Congestive heart failure (CHF) (HCC)    Diabetes mellitus without complication (HCC)    Emphysema of lung (HCC)    Grade I diastolic dysfunction    Graves disease    Heart disease    Hyperlipidemia    Hypertension    ICD (implantable cardioverter-defibrillator) in place, battery check 09-15-23    s/p [facility] CRT-D placement   LBBB (left bundle branch block)    Non-ischemic cardiomyopathy (HCC)    NSVT (nonsustained ventricular tachycardia) (HCC)    NYHA Class III cardiovascular function (HCC)    Obesity (BMI 30-39.9)    Peripheral vascular disease    Pulmonary embolism (HCC)    History of pulmonary emboli while on Eliquis ; A-fib   Stroke (HCC)    Syncope, cardiogenic    Systolic congestive  heart failure with reduced left ventricular function, NYHA class 3 (HCC)    Thyroid  disease    Ventricular tachycardia (HCC)      Social History   Tobacco Use   Smoking status: Former    Current packs/day: 0.00    Types: Cigarettes    Quit date: 12/12/2023    Years since quitting: 0.0  Vaping Use   Vaping status: Never Used  Substance Use Topics   Alcohol use: Not Currently    Comment: Once a month   Drug use: Yes    Comment: CBD gummies    Mr.Rapozo reports that he quit smoking about 2 weeks ago. His smoking use included cigarettes. He does not have any smokeless tobacco history on file. He reports that he does not currently use alcohol. He reports current drug use. Former smoker , quit 11/2023  Tobacco Cessation: Counseling given: Not Answered Pt. Counseled to continue working hard to maintain smoking cessation.  Past surgical hx, Family hx, Social hx all reviewed.  Current Outpatient Medications on File Prior to Visit  Medication Sig   apixaban  (ELIQUIS ) 5 MG TABS tablet Take 1 tablet (5 mg total) by mouth 2 (two) times daily.   atorvastatin  (LIPITOR) 40 MG tablet Take 1 tablet (40 mg total) by mouth daily.   buPROPion  (WELLBUTRIN  SR) 150 MG 12 hr tablet TAKE 1 TABLET BY MOUTH DAILY FOR 3 DAYS, THEN INCREASE TO 1 TABLET BY MOUTH TWICE DAILY   dapagliflozin  propanediol (FARXIGA ) 10 MG TABS tablet Take 1 tablet (10 mg total) by mouth daily.   furosemide  (LASIX ) 20 MG tablet Take 1 tablet (20 mg total) by mouth daily.   metFORMIN (GLUCOPHAGE) 500 MG tablet Take 500 mg by mouth daily.   methimazole  (TAPAZOLE ) 5 MG tablet Take 1 tablet (5 mg total) by mouth daily.   metoprolol  succinate (TOPROL -XL) 50 MG 24 hr tablet Take 2 tablets (100mg ) by mouth in the morning and 1 tablet (50mg ) in the afternoon.   spironolactone  (ALDACTONE ) 25 MG tablet Take 0.5 tablets (12.5 mg total) by mouth daily.   No current facility-administered medications on file prior to visit.     No Known  Allergies  Review Of Systems:  Constitutional:   No  weight loss, night sweats,  Fevers, chills, fatigue, or  lassitude.  HEENT:   No headaches,  Difficulty swallowing,  Tooth/dental problems, or  Sore throat,                No sneezing, itching, ear ache, nasal congestion, post nasal drip,   CV:  No chest pain,  Orthopnea, PND, swelling in lower extremities, anasarca, dizziness, palpitations, syncope.   GI  No heartburn, indigestion, abdominal pain, nausea, vomiting, diarrhea, change in bowel habits, loss of appetite, bloody stools.   Resp: + shortness of breath with exertion less at rest.  No excess mucus, + productive cough,  + non-productive cough,  No coughing up of blood.  No change in color of mucus.  + occasional  wheezing.  No chest wall deformity  Skin: no rash or lesions.  GU: no dysuria, change in color of urine, no urgency or frequency.  No flank pain, no hematuria   MS:  No joint pain or swelling.  No decreased range of motion.  No back pain.  Psych:  No change in mood or affect. No depression or anxiety.  No memory loss.   Vital Signs BP 128/64   Pulse 69   Temp (!) 97.4 F (36.3 C)   Ht 6' 3 (1.905 m) Comment: pt stated  Wt 273 lb 3.2 oz (123.9 kg)   SpO2 94% Comment: ra  BMI 34.15 kg/m    Physical Exam:  General- No distress,  A&Ox3, pleasant and appropriate ENT: No sinus tenderness, TM clear, pale nasal mucosa, no oral exudate,no post nasal drip, no LAN Cardiac: S1, S2, regular rate and rhythm, no murmur Chest: No wheeze/ rales/ dullness; no accessory muscle use, no nasal flaring, no sternal retractions Abd.: Soft Non-tender, nondistended, bowel sounds positive,Body mass index is 34.15 kg/m.  Ext: No clubbing cyanosis, edema, no obvious deformities noted Neuro:  normal strength, moving all extremities x 4, alert and oriented x 3, appropriate, some expressive and cognitive aphasia secondary to previous stroke Skin: No rashes, warm and dry, no obvious  skin lesions Psych: normal mood and behavior  Physical Exam   Assessment & Plan Multiple lung nodules, with hypermetabolic activity on PET Right renal mass with metabolic activity on PET Multiple lymph nodes with hypermetabolic activity on PET Former smoker, , Quit 12/18/2023 Renal biopsy ordered but unable to be completed until 01/20/2024 Plan We have scheduled you for your bronchoscopy with biopsies to further evaluate the lung nodules of concern, and to get tissue diagnosis. This is scheduled for 01/06/2024 I have placed an order for a bronchoscopy with biopsies.  We have discussed the procedure in detail.  We have reviewed the risks and benefits of the procedure. These include bleeding, infection, puncture of the lung, and adverse reaction to anesthesia. You have agreed to proceed with biopsy to evaluate the multiple lung nodules and lymph nodes. Your procedure will be done by Dr. Lamar Chris. You will receive a letter today with date time and information pertaining to the procedure. You will need someone to drive you to the procedure, stay with you during the procedure, and stay with you after the procedure. You will also need someone to stay with you for 24 hours after anesthesia to ensure you have cleared and are doing well. You will follow-up with me 1 week after the procedure to review the results and to ensure you are doing well. Call if you need us  prior to the procedure or if you have any questions at all. Please contact office for sooner follow up if  symptoms do not improve or worsen or seek emergency care      I spent 25 minutes dedicated to the care of this patient on the date of this encounter to include pre-visit review of records, face-to-face time with the patient discussing conditions above, post visit ordering of testing, clinical documentation with the electronic health record, making appropriate referrals as documented, and communicating necessary information to the  patient's healthcare team.      Lauraine JULIANNA Lites, NP 12/29/2023  12:56 PM

## 2023-12-29 NOTE — H&P (View-Only) (Signed)
 History of Present Illness Henry Schneider is a 68 y.o. male current every day smoker followed through the lung cancer screening program , referred for an abnormal LDCT ( 4 B), 10/2023.He will be followed by Dr. Shelah and Lauraine Lites NP  Synopsis Pt. Had a  lung cancer screening scan done 11/13/2023. Scan was read as a 4 B, there was a  new pleural mass in the posterior lower left hemithorax. PET scan done to further evaluate the finding. PET scan done 12/03/2023 confirmed a left lower lobe pleural based mass with an SUV of 8.1, suspicious for malignancy.There was also a  smaller pleural base nodule anterior to the left upper lobe measures 0.6 cm  with maximum SUV 3.4, along with several other nodules suspicious for malignancy. There were also hypermetabolic lymph nodes, as well as a large right renal mass measuring 8.2 cm , with an SUV of 11.9, also highly suspicious for malignancy.  Pt. Was seen by Dr. Sherrod who ordered biopsy of the right renal mass. This procedure, however , cannot be done until 01/20/2024. Therefore patient has been referred for consideration of bronchoscopy with biopsy for tissue diagnosis to be done  sooner than the biopsy of the renal mass can be scheduled.   12/29/2023 Discussed the use of AI scribe software for clinical note transcription with the patient, who gave verbal consent to proceed.  History of Present Illness Patient presents for consideration of bronchoscopy with biopsies of a 4 x 2.5 cm left lower lobe mass with an SUV of 8.1 on PET.  There are several additional lung nodules as well as lymph nodes that are concerning for malignancy.  Patient is aware of the findings of his diagnostic testing.  He had a previous stroke that has left him with an expressive  aphasia.  He also has a hard time using the phone.  We brought him in today to get his bronchoscopy scheduled, to allow us  to place a letter in his hand to ensure he has good understanding of the risks  involved in the procedure and when to be awhere for the procedure.  Patient has been scheduled for bronchoscopy with biopsy, and EBUS on 01/06/2024.  Time of the procedure is 12:55 PM with an arrival time for prep of 10:25 AM.  Patient has not had CT imaging in over a month therefore super D CT chest to been ordered for 01/01/2024 at 8:45 AM at [facility].  Patient has been provided paperwork with all of the above noted and highlighted for his convenience.  We discussed that he will stop his Eliquis  after the dose on January 03, 2024.  He understands that he will not take Eliquis  on 12 7, 12 8, or 12 9 the day of the procedure.  He verbalized understanding that where he will not eat or drink anything after midnight with the exception of medications that are necessary.  He will follow-up with me postprocedure to review the results on December 15 at 10 AM at the [employer].  He confirmed for me today he does have a friend who will drive him to the procedure, stay with him during the procedure, and drive him home from the procedure.  She will also monitor him for 24 hours postprocedure.  Patient gave informed consent to move forward with the procedure.  We discussed the risks of bleeding, infection, pneumothorax, and adverse reaction to anesthesia.  He had no further questions at completion of the visit.  He is  able to communicate well through MyChart and understands he can message us  if he has any further questions regarding this procedure.  He did have some conflicts on December night with the bronchoscopy but he has had them rescheduled as he understands that this is the most important procedure at present time.  We did discuss the importance of getting tissue diagnosis so that we can get him treated.  Patient states he quit smoking on 12/12/2023.  I have congratulated him and encouraged him to continue working hard on smoking cessation.  He understands that overall outcomes and treatment are  improved when patients are no longer smoking.     Test Results: PET Scan 12/04/2023 Hypermetabolic 8.2 cm right lower pole renal mass, highly suspicious for renal cell carcinoma or less likely metastasis. 2. Left lower lobe pleural-based mass (4.0 x 2.5 cm, SUV 8.1), suspicious for malignancy. 3. Left upper lobe pleural-based nodule (0.7 cm, SUV 4.6), suspicious for malignancy. 4. AP window lymph node (1.1 cm short axis, SUV 6.1), suspicious for malignancy. 5. Small hypermetabolic focus along the iliac side of the right sacroiliac joint (SUV 4.7), indeterminate for early metastatic disease versus degenerative change. 6. Atherosclerosis and cardiomegaly.   LDCT Chest 11/13/2023 New pleural mass in the posterior lower left hemithorax. Lung-RADS 4B, suspicious. Additional imaging evaluation or consultation with Pulmonology or Thoracic Surgery recommended. These results will be called to the ordering clinician or representative by the Radiologist Assistant, and communication documented in the PACS or [employer]. 2. Aortic atherosclerosis (ICD10-I70.0). Coronary artery calcification. 3. Enlarged pulmonic trunk, indicative of pulmonary arterial hypertension. 4.  Emphysema (ICD10-J43.9).   MRI Brain 06/2023 Brain: Acute left MCA territory infarcts including acute infarcts in left basal ganglia, left insula, overlying left frontal, parietal and temporal lobes. Associated edema with trace (1-2 mm) rightward midline shift. No evidence of acute hemorrhage, mass lesion, or hydrocephalus. Patchy T2/FLAIR hyperintensities in the white matter, compatible with chronic microvascular disease.   Vascular: Major arterial flow voids are maintained at the skull base.   Skull and upper cervical spine: Normal marrow signal.   Sinuses/Orbits: Mild paranasal sinus mucosal thickening. No acute orbital findings.   IMPRESSION: Acute left MCA territory infarcts with trace (1-2 mm)  rightward midline shift.    Latest Ref Rng & Units 12/11/2023    3:58 PM 11/11/2023   10:24 AM 10/28/2023    9:14 AM  CBC  WBC 4.0 - 10.5 K/uL 12.7  11.8  11.7   Hemoglobin 13.0 - 17.0 g/dL 82.7  82.8  83.6   Hematocrit 39.0 - 52.0 % 52.6  52.6  49.9   Platelets 150 - 400 K/uL 172  177.0  177.0        Latest Ref Rng & Units 12/11/2023    3:58 PM 10/28/2023    9:14 AM 07/17/2023    4:59 AM  BMP  Glucose 70 - 99 mg/dL 98  892  98   BUN 8 - 23 mg/dL 26  24  13    Creatinine 0.61 - 1.24 mg/dL 8.87  8.89  8.87   Sodium 135 - 145 mmol/L 141  141  139   Potassium 3.5 - 5.1 mmol/L 5.0  4.3  4.4   Chloride 98 - 111 mmol/L 104  107  111   CO2 22 - 32 mmol/L 28  26  25    Calcium  8.9 - 10.3 mg/dL 9.8  9.8  8.8     BNP    Component Value Date/Time   BNP  794.0 (H) 12/19/2014 0636    ProBNP No results found for: PROBNP  PFT No results found for: FEV1PRE, FEV1POST, FVCPRE, FVCPOST, TLC, DLCOUNC, PREFEV1FVCRT, PSTFEV1FVCRT  CUP PACEART INCLINIC DEVICE CHECK Result Date: 12/09/2023 Normal in-clinic CRT-D (multi-lead) check. Presenting Rhythm: AS/BP 73 w/ PVC's. Routine testing was performed. Thresholds, sensing, impedance trend were stable and no changes were required. HF diagnostics are stable. No treated arrhythmias. Brief NSVT episodes noted longest duration 14 seconds. Patient BiV pacing 92% of the time. Estimated longevity 2 years. Pt enrolled in remote follow-up. Patient has GORE lead. Impedance stable at this time. Patient educated and received magnet for shock plan containing device clinic number. Patient verbalized understanding. Programming changes: - Atrial, RV & LV outputs programmed from 2.5V @ 0.48ms to 2.0V @ 0.63ms.Rozelle Banter, BSN, RN  NM PET Image Initial (PI) Skull Base To Thigh Result Date: 12/05/2023 EXAM: PET AND CT SKULL BASE TO MID THIGH 12/03/2023 02:39:23 PM TECHNIQUE: RADIOPHARMACEUTICAL: 11.83 mCi F-18 FDG Uptake time 60 minutes. Glucose level  84 mg/dl. Blood pool SUV 2.6. PET imaging was acquired from the base of the skull to the mid thighs. Non-contrast enhanced computed tomography was obtained for attenuation correction and anatomic localization. COMPARISON: Chest CT 11/13/2023. CLINICAL HISTORY: Lung nodule, > 8mm. FINDINGS: HEAD AND NECK: Hypoactivity in the left temporal lobe compatible with known prior CVA. Presumed physiologic tonsillar activity with maximum SUV 7.0 on the left and 6.8 on the right in the palatine tonsils. No metabolically active cervical lymphadenopathy. CHEST: The pleural base mass posteriorly along the left lower lobe measures 4.0 x 2.5 cm on image 80 series 6 with maximum SUV 8.1 suspicious for malignancy. A smaller pleural base nodule anterior to the left upper lobe measures 0.6 cm in thickness on image 51 series 6 with maximum SUV 3.4. A pleural based nodule along the left upper lobe mediastinal margin adjacent to the aortic arch on image 52 series 6 measures 0.7 cm in thickness and has a maximum SUV of 4.6 likewise suspicious. AP window lymph node 1.1 cm short axis on image 57 series 6 with maximum SUV 6.1, suspicious for malignancy. Cardiomegaly. Thoracic aortic, coronary artery, and branch vessel atheromatous vascular calcifications. ABDOMEN AND PELVIS: Subtle accentuated activity along the anterior superior spleen with maximum SUV 5.1 compared to typical background splenic activity elsewhere of 3.0. Significance uncertain. Hypermetabolic 8.2 cm mass of the right kidney lower pole maximum SUV 11.9, high suspicion for renal cell carcinoma or less likely a metastatic lesion. Systemic atherosclerosis is present, including the aorta and iliac arteries. Mild sigmoid colon diverticulosis. Physiologic activity within the gastrointestinal and genitourinary systems. No metabolically active lymphadenopathy. BONES AND SOFT TISSUE: Small focus of accentuated metabolic activity along the iliac side of the right sacroiliac joint with  maximum SUV 4.7, nonspecific for early metastatic disease versus degenerative finding. IMPRESSION: 1. Hypermetabolic 8.2 cm right lower pole renal mass, highly suspicious for renal cell carcinoma or less likely metastasis. 2. Left lower lobe pleural-based mass (4.0 x 2.5 cm, SUV 8.1), suspicious for malignancy. 3. Left upper lobe pleural-based nodule (0.7 cm, SUV 4.6), suspicious for malignancy. 4. AP window lymph node (1.1 cm short axis, SUV 6.1), suspicious for malignancy. 5. Small hypermetabolic focus along the iliac side of the right sacroiliac joint (SUV 4.7), indeterminate for early metastatic disease versus degenerative change. 6. Atherosclerosis and cardiomegaly. Electronically signed by: Ryan Salvage MD 12/05/2023 01:49 PM EST RP Workstation: [id number]     Past medical hx Past Medical History:  Diagnosis Date  A-fib [facility])    Acute ischemic left middle cerebral artery (MCA) stroke (HCC) 07/15/2023   aphasia   Aphasia due to acute cerebrovascular accident (CVA) (HCC)    Buerger's disease    Cardiac LV ejection fraction 21-30% 07/12/2023   June 2020, EF 25-30%. while in 2016, EF was only 15%   Chronic combined systolic and diastolic CHF (congestive heart failure) (HCC)    Congestive heart failure (CHF) (HCC)    Diabetes mellitus without complication (HCC)    Emphysema of lung (HCC)    Grade I diastolic dysfunction    Graves disease    Heart disease    Hyperlipidemia    Hypertension    ICD (implantable cardioverter-defibrillator) in place, battery check 09-15-23    s/p [facility] CRT-D placement   LBBB (left bundle branch block)    Non-ischemic cardiomyopathy (HCC)    NSVT (nonsustained ventricular tachycardia) (HCC)    NYHA Class III cardiovascular function (HCC)    Obesity (BMI 30-39.9)    Peripheral vascular disease    Pulmonary embolism (HCC)    History of pulmonary emboli while on Eliquis ; A-fib   Stroke (HCC)    Syncope, cardiogenic    Systolic congestive  heart failure with reduced left ventricular function, NYHA class 3 (HCC)    Thyroid  disease    Ventricular tachycardia (HCC)      Social History   Tobacco Use   Smoking status: Former    Current packs/day: 0.00    Types: Cigarettes    Quit date: 12/12/2023    Years since quitting: 0.0  Vaping Use   Vaping status: Never Used  Substance Use Topics   Alcohol use: Not Currently    Comment: Once a month   Drug use: Yes    Comment: CBD gummies    Mr.Rapozo reports that he quit smoking about 2 weeks ago. His smoking use included cigarettes. He does not have any smokeless tobacco history on file. He reports that he does not currently use alcohol. He reports current drug use. Former smoker , quit 11/2023  Tobacco Cessation: Counseling given: Not Answered Pt. Counseled to continue working hard to maintain smoking cessation.  Past surgical hx, Family hx, Social hx all reviewed.  Current Outpatient Medications on File Prior to Visit  Medication Sig   apixaban  (ELIQUIS ) 5 MG TABS tablet Take 1 tablet (5 mg total) by mouth 2 (two) times daily.   atorvastatin  (LIPITOR) 40 MG tablet Take 1 tablet (40 mg total) by mouth daily.   buPROPion  (WELLBUTRIN  SR) 150 MG 12 hr tablet TAKE 1 TABLET BY MOUTH DAILY FOR 3 DAYS, THEN INCREASE TO 1 TABLET BY MOUTH TWICE DAILY   dapagliflozin  propanediol (FARXIGA ) 10 MG TABS tablet Take 1 tablet (10 mg total) by mouth daily.   furosemide  (LASIX ) 20 MG tablet Take 1 tablet (20 mg total) by mouth daily.   metFORMIN (GLUCOPHAGE) 500 MG tablet Take 500 mg by mouth daily.   methimazole  (TAPAZOLE ) 5 MG tablet Take 1 tablet (5 mg total) by mouth daily.   metoprolol  succinate (TOPROL -XL) 50 MG 24 hr tablet Take 2 tablets (100mg ) by mouth in the morning and 1 tablet (50mg ) in the afternoon.   spironolactone  (ALDACTONE ) 25 MG tablet Take 0.5 tablets (12.5 mg total) by mouth daily.   No current facility-administered medications on file prior to visit.     No Known  Allergies  Review Of Systems:  Constitutional:   No  weight loss, night sweats,  Fevers, chills, fatigue, or  lassitude.  HEENT:   No headaches,  Difficulty swallowing,  Tooth/dental problems, or  Sore throat,                No sneezing, itching, ear ache, nasal congestion, post nasal drip,   CV:  No chest pain,  Orthopnea, PND, swelling in lower extremities, anasarca, dizziness, palpitations, syncope.   GI  No heartburn, indigestion, abdominal pain, nausea, vomiting, diarrhea, change in bowel habits, loss of appetite, bloody stools.   Resp: + shortness of breath with exertion less at rest.  No excess mucus, + productive cough,  + non-productive cough,  No coughing up of blood.  No change in color of mucus.  + occasional  wheezing.  No chest wall deformity  Skin: no rash or lesions.  GU: no dysuria, change in color of urine, no urgency or frequency.  No flank pain, no hematuria   MS:  No joint pain or swelling.  No decreased range of motion.  No back pain.  Psych:  No change in mood or affect. No depression or anxiety.  No memory loss.   Vital Signs BP 128/64   Pulse 69   Temp (!) 97.4 F (36.3 C)   Ht 6' 3 (1.905 m) Comment: pt stated  Wt 273 lb 3.2 oz (123.9 kg)   SpO2 94% Comment: ra  BMI 34.15 kg/m    Physical Exam:  General- No distress,  A&Ox3, pleasant and appropriate ENT: No sinus tenderness, TM clear, pale nasal mucosa, no oral exudate,no post nasal drip, no LAN Cardiac: S1, S2, regular rate and rhythm, no murmur Chest: No wheeze/ rales/ dullness; no accessory muscle use, no nasal flaring, no sternal retractions Abd.: Soft Non-tender, nondistended, bowel sounds positive,Body mass index is 34.15 kg/m.  Ext: No clubbing cyanosis, edema, no obvious deformities noted Neuro:  normal strength, moving all extremities x 4, alert and oriented x 3, appropriate, some expressive and cognitive aphasia secondary to previous stroke Skin: No rashes, warm and dry, no obvious  skin lesions Psych: normal mood and behavior  Physical Exam   Assessment & Plan Multiple lung nodules, with hypermetabolic activity on PET Right renal mass with metabolic activity on PET Multiple lymph nodes with hypermetabolic activity on PET Former smoker, , Quit 12/18/2023 Renal biopsy ordered but unable to be completed until 01/20/2024 Plan We have scheduled you for your bronchoscopy with biopsies to further evaluate the lung nodules of concern, and to get tissue diagnosis. This is scheduled for 01/06/2024 I have placed an order for a bronchoscopy with biopsies.  We have discussed the procedure in detail.  We have reviewed the risks and benefits of the procedure. These include bleeding, infection, puncture of the lung, and adverse reaction to anesthesia. You have agreed to proceed with biopsy to evaluate the multiple lung nodules and lymph nodes. Your procedure will be done by Dr. Lamar Chris. You will receive a letter today with date time and information pertaining to the procedure. You will need someone to drive you to the procedure, stay with you during the procedure, and stay with you after the procedure. You will also need someone to stay with you for 24 hours after anesthesia to ensure you have cleared and are doing well. You will follow-up with me 1 week after the procedure to review the results and to ensure you are doing well. Call if you need us  prior to the procedure or if you have any questions at all. Please contact office for sooner follow up if  symptoms do not improve or worsen or seek emergency care      I spent 25 minutes dedicated to the care of this patient on the date of this encounter to include pre-visit review of records, face-to-face time with the patient discussing conditions above, post visit ordering of testing, clinical documentation with the electronic health record, making appropriate referrals as documented, and communicating necessary information to the  patient's healthcare team.      Lauraine JULIANNA Lites, NP 12/29/2023  12:56 PM

## 2023-12-29 NOTE — Patient Instructions (Signed)
 Please follow up sooner if symptoms increase or worsen  Due to recent changes in healthcare laws, you may see the results of your imaging and laboratory studies on MyChart before your provider has had a chance to review them.  We understand that in some cases there may be results that are confusing or concerning to you. Not all laboratory results come back in the same time frame and the provider may be waiting for multiple results in order to interpret others.  Please give us  48 hours in order for your provider to thoroughly review all the results before contacting the office for clarification of your results.   Thank you for trusting me with your gastrointestinal care!   Ellouise Console, PA-C _______________________________________________________  If your blood pressure at your visit was 140/90 or greater, please contact your primary care physician to follow up on this.  _______________________________________________________  If you are age 50 or older, your body mass index should be between 23-30. Your Body mass index is 34.01 kg/m. If this is out of the aforementioned range listed, please consider follow up with your Primary Care Provider.  If you are age 23 or younger, your body mass index should be between 19-25. Your Body mass index is 34.01 kg/m. If this is out of the aformentioned range listed, please consider follow up with your Primary Care Provider.   ________________________________________________________  The [facility] providers would like to encourage you to use MYCHART to communicate with providers for non-urgent requests or questions.  Due to long hold times on the telephone, sending your provider a message by [facility] may be a faster and more efficient way to get a response.  Please allow 48 business hours for a response.  Please remember that this is for non-urgent requests.  _______________________________________________________

## 2023-12-29 NOTE — Patient Instructions (Addendum)
 It is good to see you today. We have scheduled you for your bronchoscopy with biopsies to further evaluate the lung nodules of concern, and to get tissue diagnosis. This is scheduled for 01/06/2024 I have placed an order for a bronchoscopy with biopsies.  We have discussed the procedure in detail.  We have reviewed the risks and benefits of the procedure. These include bleeding, infection, puncture of the lung, and adverse reaction to anesthesia. You have agreed to proceed with biopsy to evaluate the multiple lung nodules and lymph nodes. Your procedure will be done by Dr. Lamar Chris. You will receive a letter today with date time and information pertaining to the procedure. You will need someone to drive you to the procedure, stay with you during the procedure, and stay with you after the procedure. You will also need someone to stay with you for 24 hours after anesthesia to ensure you have cleared and are doing well. You will follow-up with me 1 week after the procedure to review the results and to ensure you are doing well. Call if you need us  prior to the procedure or if you have any questions at all. Please contact office for sooner follow up if symptoms do not improve or worsen or seek emergency care

## 2023-12-29 NOTE — Telephone Encounter (Signed)
 Please schedule the following:  Provider performing procedure: [place]rum Diagnosis:  Lung nodules Which side if for nodule / mass?  Left upper and lower lobes Procedure:  Navigational bronchoscopy with biopsies and EBUS  Has patient been spoken to by Provider and given informed consent?  Lauraine Lites NP on 12/29/2023 Anesthesia:  General Do you need Fluro? Yes Duration of procedure: 1.5 hours Date: 01/06/2024, fourth case of the day please Alternate Date:  There are available slots this day  Time:Fourth case Location:  [facility] Does patient have OSA? 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  No  Medication Restriction/ Anticoagulate/Antiplatelet:  Will need to hold Eliquis  x 48 hours. Stop Eliquis  after the 01/03/2024 dose. No Eliquis  on 12/7, 12/8, or 12/9.  Pre-op Labs Ordered:determined by Anesthesia Imaging request:  Super D ordered.   (If, SuperDimension CT Chest, please have STAT courier sent to ENDO)

## 2024-01-01 ENCOUNTER — Inpatient Hospital Stay: Admission: RE | Admit: 2024-01-01 | Discharge: 2024-01-01 | Attending: Acute Care | Admitting: Acute Care

## 2024-01-01 DIAGNOSIS — R918 Other nonspecific abnormal finding of lung field: Secondary | ICD-10-CM

## 2024-01-05 ENCOUNTER — Other Ambulatory Visit: Payer: Self-pay

## 2024-01-05 ENCOUNTER — Telehealth: Payer: Self-pay

## 2024-01-05 ENCOUNTER — Ambulatory Visit

## 2024-01-05 ENCOUNTER — Ambulatory Visit: Admitting: Neurology

## 2024-01-05 ENCOUNTER — Encounter (HOSPITAL_COMMUNITY): Payer: Self-pay | Admitting: Emergency Medicine

## 2024-01-05 ENCOUNTER — Encounter: Payer: Self-pay | Admitting: Neurology

## 2024-01-05 ENCOUNTER — Encounter: Payer: Self-pay | Admitting: Cardiology

## 2024-01-05 VITALS — BP 96/69 | HR 64 | Ht 75.0 in | Wt 274.2 lb

## 2024-01-05 DIAGNOSIS — I2585 Chronic coronary microvascular dysfunction: Secondary | ICD-10-CM

## 2024-01-05 DIAGNOSIS — I63412 Cerebral infarction due to embolism of left middle cerebral artery: Secondary | ICD-10-CM | POA: Diagnosis not present

## 2024-01-05 DIAGNOSIS — G3184 Mild cognitive impairment, so stated: Secondary | ICD-10-CM

## 2024-01-05 DIAGNOSIS — I482 Chronic atrial fibrillation, unspecified: Secondary | ICD-10-CM | POA: Diagnosis not present

## 2024-01-05 DIAGNOSIS — I5022 Chronic systolic (congestive) heart failure: Secondary | ICD-10-CM | POA: Diagnosis not present

## 2024-01-05 NOTE — Progress Notes (Signed)
 Anesthesia Chart Review: SAME DAY WORK-UP  Case: [id number] Date/Time: 01/06/24 1255   Procedure: VIDEO BRONCHOSCOPY WITH ENDOBRONCHIAL NAVIGATION (Left)   Anesthesia type: General   Diagnosis: Multiple lung nodules on CT [R91.8]   Pre-op diagnosis: lung nodules   [facility]: [facility] / [facility]   Surgeons: Shelah Lamar RAMAN, MD       DISCUSSION: Patient is a 68 year old male scheduled for the above procedure. He was recently found to have a suspicious right renal mass and left lung nodule with hypermetabolic AP window node.   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    Preoperative cardiology input outlined by Daneen Perkins, NP on 12/23/2023: Chart reviewed as part of pre-operative protocol coverage.  Patient was last seen in the office on 12/17/2023 by Dr. Anner.  Per Dr. Anner, patient okay to proceed with biopsy. GDMT limited by hypotension. Other than exertional dyspnea, fairly asymptomatic of CHF. Obesity, deconditioning, and smoking also felt to be contributing to dyspnea. Six month follow-up planned.     Per office protocol, patient can hold Eliquis  for 2 days prior to procedure. Please resume Eliquis  as soon as possible postprocedure, at the discretion of the surgeon.    EP follow-up with Dr. Inocencio on 12/09/2023. Normal device function. Euvolemic. No changes made. Minimal PAF on interrogation. He continued to have PVCs  and non-sustained VT, some treated with ATP and has history of ICD shock. If he received additional ICD shocks then he will likely need antiarrythmic therapy. 6 month follow-up planned.   EP Perioperative ICD Recommendations: Device Information: Clinic EP Physician:  Soyla Inocencio, MD  Device Type:  Defibrillator Manufacturer and Phone #:  [employer]: [phone] Pacemaker Dependent?:  No. Date of Last Device Check:  12/11/23         Normal Device Function?:  Yes.     Electrophysiologist's Recommendations:  Have magnet available. Provide continuous ECG monitoring when magnet is used or reprogramming is to be performed.  Procedure may interfere with device function.  Magnet should be placed over device during procedure.   He had neurology follow-up with Dr. Rosemarie on 01/05/2024. S/p CVA 06/2023.    He has a pending CT renal biopsy for 8.2 cm right renal mass suspicious for renal carcinoma, less likely metastatic disease. Notes suggest since biopsy could not be scheduled until 01/20/2024, he was referred to pulmonology by oncology for consideration of bronchoscopy sooner to get tissue diagnosis.   Last Elilquis 01/03/2024. Last Farxiga  01/04/2024.  Anesthesia team to evaluate on the day of surgery.   VS:  Wt Readings from Last 3 Encounters:  01/05/24 124.4 kg  12/29/23 123.9 kg  12/29/23 123.4 kg   BP Readings from Last 3 Encounters:  01/05/24 96/69  12/29/23 128/64  12/29/23 108/68   Pulse Readings from Last 3 Encounters:  01/05/24 64  12/29/23 69  12/29/23 63     PROVIDERS: Billy Philippe SAUNDERS, NP is PCP  Anner Lenis, MD is cardiologist Inocencio Soyla, MD is EP Rosemarie Rothman, MD is neurologist    LABS:  Most recent lab results in [facility] include: Lab Results  Component Value Date   WBC 12.7 (H) 12/11/2023   HGB 17.2 (H) 12/11/2023   HCT 52.6 (H) 12/11/2023   PLT 172 12/11/2023   GLUCOSE 98 12/11/2023   CHOL 111 10/28/2023   TRIG 62.0 10/28/2023   HDL  47.50 10/28/2023   LDLCALC 52 10/28/2023   ALT 38 12/11/2023   AST 33 12/11/2023   NA 141 12/11/2023   K 5.0 12/11/2023   CL 104 12/11/2023   CREATININE 1.12 12/11/2023   BUN 26 (H) 12/11/2023   CO2 28 12/11/2023   TSH 0.02 (L) 10/28/2023   INR 1.1 07/11/2023   HGBA1C 6.7 (H) 10/28/2023   MICROALBUR <0.7 10/28/2023    IMAGES: CT Super D Chest 01/01/2024: Report pending.  PET Scan 12/03/2023: IMPRESSION: 1. Hypermetabolic 8.2 cm right lower pole renal mass, highly suspicious for renal cell carcinoma or less likely metastasis. 2. Left lower lobe pleural-based mass (4.0 x 2.5 cm, SUV 8.1), suspicious for malignancy. 3. Left upper lobe pleural-based nodule (0.7 cm, SUV 4.6), suspicious for malignancy. 4. AP window lymph node (1.1 cm short axis, SUV 6.1), suspicious for malignancy. 5. Small hypermetabolic focus along the iliac side of the right sacroiliac joint (SUV 4.7), indeterminate for early metastatic disease versus degenerative change. 6. Atherosclerosis and cardiomegaly.  CT Chest LCS 11/13/2023: IMPRESSION: 1. New pleural mass in the posterior lower left hemithorax. Lung-RADS 4B, suspicious. Additional imaging evaluation or consultation with Pulmonology or Thoracic Surgery recommended. These results will be called to the ordering clinician or representative by the Radiologist Assistant, and communication documented in the PACS or [employer]. 2. Aortic atherosclerosis (ICD10-I70.0). Coronary artery calcification. 3. Enlarged pulmonic trunk, indicative of pulmonary arterial hypertension. 4.  Emphysema (ICD10-J43.9).   MRI Brain 07/14/2023: IMPRESSION: Acute left MCA territory infarcts with trace (1-2 mm) rightward midline shift.   CT Head Perfusion/ CTA Head & Neck 07/11/2023: IMPRESSION: CT HEAD: 1. Evolving cytotoxic edema involving the left insula and overlying left cerebral hemisphere, consistent with an evolving acute left MCA distribution infarct. No  acute intracranial hemorrhage. 2. Aspects = 7. 3. Abnormal hyperdensity involving a proximal left MCA branch, concerning for thrombus.   CTA HEAD AND NECK:  1. Acute proximal M3 stump occlusion, inferior division left MCA. 2. Otherwise negative CTA for large vessel occlusion or other emergent finding. 3. Mild for age atheromatous change about the carotid bifurcations and carotid siphons without hemodynamically significant stenosis. 4. Aortic Atherosclerosis (ICD10-I70.0) and Emphysema (ICD10-J43.9).   CT PERFUSION: 20 mL acute core infarct involving the posterior left frontoparietal region, posterior left MCA territory. 74 mL surrounding ischemic penumbra.     EKG: 12/09/2023: Atrial-sensed ventricular-paced rhythm with occasional AV dual-paced complexes and with frequent Premature ventricular complexes Biventricular pacemaker detected When compared with ECG of 11-Jul-2023 23:04, No significant change since last tracing Confirmed by [place], Will ([id number]) on 12/09/2023 3:45:24 PM    CV: Echo 07/12/2023: IMPRESSIONS   1. Left ventricular ejection fraction, by estimation, is 25 to 30%. The  left ventricle has severely decreased function. The left ventricle  demonstrates regional wall motion abnormalities (see scoring  diagram/findings for description). There is mild left   ventricular hypertrophy. Left ventricular diastolic parameters are  consistent with Grade I diastolic dysfunction (impaired relaxation).   2. Right ventricular systolic function is normal. The right ventricular  size is mildly enlarged. There is normal pulmonary artery systolic  pressure. The estimated right ventricular systolic pressure is 26.8 mmHg.  3. Left atrial size was mildly dilated.   4. Right atrial size was mildly dilated.   5. The mitral valve is normal in structure. Trivial mitral valve  regurgitation. No evidence of mitral stenosis.   6. The aortic valve is tricuspid. Aortic valve regurgitation is  not  visualized. No aortic stenosis is present.   7. Aortic dilatation noted. There is dilatation of the ascending aorta,  measuring 40 mm.   8. The inferior vena cava is normal in size with greater than 50%  respiratory variability, suggesting right atrial pressure of 3 mmHg.  - Conclusion(s)/Recommendation(s): Given presentation with CVA and severe  systolic dysfunction with apical akinesis, recommend repeat limited echo  with contrast to evaluate for LV thrombus.  - Comparison TTE 02/26/2021 [facility]: LV moderately dilated.  LV systolic function is severely reduced, LVEF 25-30%, severe inferior and inferolateral wall as well as distal septal and apical  Hypokinesis, mild concentric LVH., moderately dilated RV with normal systolic function, MV leaflets mildly thickened with posteriorly directed jet of at least mild-moderate MR, moderate TR, moderate pulmonary hypertension with PAP systolic pressure 45 mmHg plus CVP, trace-mild PR. Depressed LV function since at least 11/2014, EF 15-20%.   Nuclear stress test 09/17/2021 ([facility]): 1. No evidence of focal jeopardized myocardium or infarct.  2. Global hypokinesis with ejection fraction of 15% at pharmacologic stress.    CTA Coronary 01/12/2015 [facility]): - The Agatston coronary artery calcium  score is 449; LAD:  390; RCA: 109 .  - There is co dominance of the coronary artery system.  - Left main: There is no stenosis.  There is a patent ramus branch.  - LAD:  Calcified and noncalcified plaque is noted in the mid and distal left anterior descending coronary artery.  Mixed calcified and noncalcified plaque is noted in the origin of the 1st diagonal, without hemodynamically significant stenosis. Distal LAD is patent.  - Circumflex:  There is minimal calcified plaque in the proximal segment of the left circumflex coronary artery, without   IMPRESSION:  Agatston coronary calcium  score of 449.   No hemodynamically significant stenosis.  Significant dilation of the left atrium and left ventricle. (EF 15-20% by echo, LBBB on EKG)  Past Medical History:  Diagnosis Date   A-fib (HCC)    Acute ischemic left middle cerebral artery (MCA) stroke (HCC) 07/15/2023   aphasia   Aphasia due to acute cerebrovascular accident (CVA) (HCC)    Buerger's disease    Cardiac LV ejection fraction 21-30% 07/12/2023   June 2020, EF 25-30%. while in 2016, EF was only 15%   Chronic combined systolic and diastolic CHF (congestive heart failure) (HCC)    Congestive heart failure (CHF) (HCC)    Diabetes mellitus without complication (HCC)    type 2   Dyspnea    with exertion   Emphysema of lung (HCC)    Grade I diastolic dysfunction    Graves disease    Heart disease    Hyperlipidemia    Hypertension    ICD (implantable cardioverter-defibrillator) in place, battery check 09-15-23    s/p [facility] [employer] CRT-D placement   LBBB (left bundle branch block)    Memory loss, short term    Non-ischemic cardiomyopathy (HCC)    NSVT (nonsustained ventricular tachycardia) (HCC)    NYHA Class III cardiovascular function (HCC)    Obesity (BMI 30-39.9)    Peripheral vascular disease    Pulmonary embolism (HCC)  History of pulmonary emboli while on Eliquis ; A-fib   Stroke (HCC)    Syncope, cardiogenic    Systolic congestive heart failure with reduced left ventricular function, NYHA class 3 (HCC)    Thyroid  disease    Ventricular tachycardia [facility])     Past Surgical History:  Procedure Laterality Date   colonoscopy     IR CT HEAD LTD  07/12/2023   IR PERCUTANEOUS ART THROMBECTOMY/INFUSION INTRACRANIAL INC DIAG ANGIO  07/12/2023   IR US  GUIDE VASC ACCESS RIGHT  07/12/2023   RADIOLOGY WITH ANESTHESIA N/A 07/12/2023   Procedure: RADIOLOGY WITH ANESTHESIA;  Surgeon: Radiologist, Medication, MD;  Location: [facility] OR;  Service: Radiology;  Laterality: N/A;    MEDICATIONS: No current facility-administered  medications for this encounter.    dapagliflozin  propanediol (FARXIGA ) 10 MG TABS tablet   apixaban  (ELIQUIS ) 5 MG TABS tablet   atorvastatin  (LIPITOR) 40 MG tablet   buPROPion  (WELLBUTRIN  SR) 150 MG 12 hr tablet   furosemide  (LASIX ) 20 MG tablet   metFORMIN (GLUCOPHAGE) 500 MG tablet   methimazole  (TAPAZOLE ) 5 MG tablet   metoprolol  succinate (TOPROL -XL) 50 MG 24 hr tablet   spironolactone  (ALDACTONE ) 25 MG tablet    Isaiah Ruder, PA-C Surgical Short Stay/Anesthesiology [facility] Phone [phone] [facility] Phone [phone] 01/05/2024 5:24 PM

## 2024-01-05 NOTE — Telephone Encounter (Signed)
 Billy Philippe SAUNDERS, NP  Dear NP Billy,  I aplogize when I went in the room to perform a mmse test he mentioned that he was having one. I saw that he was on eliquis  and thought if he is having it I should check with the provider who is completing procedure for a surgical clearance form.  Thanks,  [employer]

## 2024-01-05 NOTE — Telephone Encounter (Signed)
-----   Message from Philippe Slade sent at 01/05/2024  2:38 PM EST ----- Regarding: RE: surgical clearance form I typically sign a surgical clearance form but usually whom ever is asking for it, will provide me the form.   Thank you, JoAnna, NP ----- Message ----- From: Oneita Hoist, CMA Sent: 01/05/2024   2:34 PM EST To: Philippe JONELLE Slade, NP Subject: RE: surgical clearance form                    I need one from your office for a biopsy he verbally mentioned today that ya'll were doing for Dr. Rosemarie in regards to holding eliquis  ----- Message ----- From: Slade Philippe JONELLE, NP Sent: 01/05/2024   1:57 PM EST To: Hoist Oneita, CMA Subject: RE: surgical clearance form                    Hello,  Do you have a surgical clearance form you need to me sign?   Thank you, JoAnna, NP ----- Message ----- From: Oneita Hoist, CMA Sent: 01/05/2024   1:45 PM EST To: Philippe JONELLE Slade, NP Subject: surgical clearance form                        Patient is in dr. Rosemarie office stating has an upcoming biopsy. We will need a surgical clearance form faxed to our office at [phone]. He was seen  today for an appt

## 2024-01-05 NOTE — Progress Notes (Signed)
 PERIOPERATIVE PRESCRIPTION FOR IMPLANTED CARDIAC DEVICE PROGRAMMING  Patient Information: Name:  Henry Schneider  DOB:  1955/05/12  MRN:  [id number]    Planned Procedure:  Robotic assisted navigational bronchoscopy, endobronchial ultrasound  Surgeon:  Dr. Lamar Chris  Date of Procedure:  01/06/2024  Cautery will be used.  Position during surgery:  Supine   Device Information:  Clinic EP Physician:  Soyla Norton, MD   Device Type:  Defibrillator Manufacturer and Phone #:  [employer]: [phone] Pacemaker Dependent?:  No. Date of Last Device Check:  12/11/23 Normal Device Function?:  Yes.    Electrophysiologist's Recommendations:  Have magnet available. Provide continuous ECG monitoring when magnet is used or reprogramming is to be performed.  Procedure may interfere with device function.  Magnet should be placed over device during procedure.  Per Device Clinic Standing Orders, Powell Level, RN  7:30 AM 01/05/2024

## 2024-01-05 NOTE — Progress Notes (Signed)
 [facility] [address] [place]. [place] [address] [phone]       OFFICE FOLLOW-UP NOTE  [name] Date of Birth:  [birth year] Medical Record Number:  [id number]   HPI: [name] is a 68 year old pleasant Caucasian male seen today for initial office visit for management for stroke in June 2025.  History is obtained from the patient and review of electronic medical records.  I personally reviewed pertinent available imaging films in PACS. Henry Schneider is a 68 y.o. male with hx of PE on Eliquis , possibly history of A-fib as well, history of systolic CHF/NICM with an EF of 15% in April 2017 with improvement in 2021 to 30% to 35%, status post [facility] Scientific CRT-D placement, HTN, HLD, chronic left bundle branch block, peripheral vascular disease, Buerger's disease, emphysema, tobacco abuse, presented to [facility] via private vehicle for evaluation of altered mental status. He walked into the ER lobby and said something is wrong but could not really have much further conversation. ED provider evaluation was concerning for aphasia. Code stroke was activated. Teleneurology MD evaluated the patient. CT head showed evolving cytotoxic edema involving left insula and overlying left cerebral hemisphere consistent with evolving acute left MCA distribution infarct, aspects score 7, abnormal hyperdense proximal left MCA branch concerning for thrombus. CT angiography head and neck with acute proximal M3 stump occlusion, inferior division of the left MCA, otherwise negative. Perfusion profile favorable with a core infarct of 20 mm and perfusion deficit of 74 mL.  After obtaining written informed consent and discussion of risk benefits alternatives patient underwent successful mechanical thrombectomy with TICI 2b flow restoration.  MRI scan confirmed patchy left insular and posterior MCA branch infarct.  Transthoracic echo showed ejection fraction of 25 to 30% without  definite clot.  LDL cholesterol 44 mg percent.  Hemoglobin A1c of 6.0.  Patient was counseled to be compliant with taking Eliquis  which was started.  Patient states he has done well.  He has occasional word finding difficulties.  He is also having some trouble with numbers.  He often mixes his numbers.  He has finished outpatient occupational and speech therapy.  He is living alone.  He is driving.  He is independent in all activities of daily living.  He has been compliant with taking his Eliquis  all his medications and the medical follow-up.  He has quit smoking. ROS:   14 system review of systems is positive for difficulty with numbers, bruising, word finding difficulties, memory loss and all other systems negative PMH:  Past Medical History:  Diagnosis Date   A-fib (HCC)    Acute ischemic left middle cerebral artery (MCA) stroke (HCC) 07/15/2023   aphasia   Aphasia due to acute cerebrovascular accident (CVA) (HCC)    Buerger's disease    Cardiac LV ejection fraction 21-30% 07/12/2023   June 2020, EF 25-30%. while in 2016, EF was only 15%   Chronic combined systolic and diastolic CHF (congestive heart failure) (HCC)    Congestive heart failure (CHF) (HCC)    Diabetes mellitus without complication (HCC)    type 2   Dyspnea    with exertion   Emphysema of lung (HCC)    Grade I diastolic dysfunction    Graves disease    Heart disease    Hyperlipidemia    Hypertension    ICD (implantable cardioverter-defibrillator) in place, battery check 09-15-23    s/p [facility] Scientific CRT-D placement   LBBB (left bundle branch block)  Memory loss, short term    Non-ischemic cardiomyopathy (HCC)    NSVT (nonsustained ventricular tachycardia) (HCC)    NYHA Class III cardiovascular function (HCC)    Obesity (BMI 30-39.9)    Peripheral vascular disease    Pulmonary embolism (HCC)    History of pulmonary emboli while on Eliquis ; A-fib   Stroke (HCC)    Syncope, cardiogenic    Systolic congestive  heart failure with reduced left ventricular function, NYHA class 3 (HCC)    Thyroid  disease    Ventricular tachycardia (HCC)     Social History:  Social History   Socioeconomic History   Marital status: Single    Spouse name: Not on file   Number of children: 0   Years of education: Not on file   Highest education level: High school graduate  Occupational History   Not on file  Tobacco Use   Smoking status: Former    Current packs/day: 0.00    Types: Cigarettes    Quit date: 12/12/2023    Years since quitting: 0.0   Smokeless tobacco: Never  Vaping Use   Vaping status: Never Used  Substance and Sexual Activity   Alcohol use: Yes    Comment: occasional beer   Drug use: Yes    Comment: CBD gummies - pt informed to withhold until after 01/06/24 surgery   Sexual activity: Not Currently  Other Topics Concern   Not on file  Social History Narrative   Not on file   Social Drivers of Health   Financial Resource Strain: Low Risk  (10/28/2023)   Overall Financial Resource Strain (CARDIA)    Difficulty of Paying Living Expenses: Not hard at all  Food Insecurity: No Food Insecurity (10/28/2023)   Hunger Vital Sign    Worried About Running Out of Food in the Last Year: Never true    Ran Out of Food in the Last Year: Never true  Transportation Needs: No Transportation Needs (10/28/2023)   PRAPARE - Administrator, Civil Service (Medical): No    Lack of Transportation (Non-Medical): No  Physical Activity: Sufficiently Active (10/28/2023)   Exercise Vital Sign    Days of Exercise per Week: 6 days    Minutes of Exercise per Session: 50 min  Stress: No Stress Concern Present (10/28/2023)   [employer] of Occupational Health - Occupational Stress Questionnaire    Feeling of Stress: Not at all  Social Connections: Socially Isolated (10/28/2023)   Social Connection and Isolation Panel    Frequency of Communication with Friends and Family: Never    Frequency of Social  Gatherings with Friends and Family: Never    Attends Religious Services: Never    Database Administrator or Organizations: Yes    Attends Banker Meetings: Never    Marital Status: Never married  Intimate Partner Violence: Not At Risk (10/28/2023)   Humiliation, Afraid, Rape, and Kick questionnaire    Fear of Current or Ex-Partner: No    Emotionally Abused: No    Physically Abused: No    Sexually Abused: No    Medications:   Current Outpatient Medications on File Prior to Visit  Medication Sig Dispense Refill   apixaban  (ELIQUIS ) 5 MG TABS tablet Take 1 tablet (5 mg total) by mouth 2 (two) times daily. 60 tablet 0   atorvastatin  (LIPITOR) 40 MG tablet Take 1 tablet (40 mg total) by mouth daily. 30 tablet 0   buPROPion  (WELLBUTRIN  SR) 150 MG 12 hr tablet TAKE  1 TABLET BY MOUTH DAILY FOR 3 DAYS, THEN INCREASE TO 1 TABLET BY MOUTH TWICE DAILY 180 tablet 1   dapagliflozin  propanediol (FARXIGA ) 10 MG TABS tablet Take 1 tablet (10 mg total) by mouth daily. 30 tablet 0   furosemide  (LASIX ) 20 MG tablet Take 1 tablet (20 mg total) by mouth daily. 30 tablet 0   metFORMIN (GLUCOPHAGE) 500 MG tablet Take 500 mg by mouth daily.     methimazole  (TAPAZOLE ) 5 MG tablet Take 1 tablet (5 mg total) by mouth daily. 90 tablet 0   metoprolol  succinate (TOPROL -XL) 50 MG 24 hr tablet Take 2 tablets (100mg ) by mouth in the morning and 1 tablet (50mg ) in the afternoon. 30 tablet 0   spironolactone  (ALDACTONE ) 25 MG tablet Take 0.5 tablets (12.5 mg total) by mouth daily. 30 tablet 0   No current facility-administered medications on file prior to visit.    Allergies:  No Known Allergies  Physical Exam General: well developed, well nourished, seated, in no evident distress Head: head normocephalic and atraumatic.  Neck: supple with no carotid or supraclavicular bruits Cardiovascular: regular rate and rhythm, no murmurs Musculoskeletal: no deformity Skin:  no rash/petichiae Vascular:  Normal  pulses all extremities Vitals:   01/05/24 1311  BP: 96/69  Pulse: 64  SpO2: 98%   Neurologic Exam Mental Status: Awake and fully alert. Oriented to place and time. Recent and remote memory intact. Attention span, concentration and fund of knowledge appropriate. Mood and affect appropriate.  Diminished recall 1/3.  Able to name 13 animals pompholyx.  Clock drawing 3/4. Cranial Nerves: Fundoscopic exam reveals sharp disc margins. Pupils equal, briskly reactive to light. Extraocular movements full without nystagmus. Visual fields full to confrontation. Hearing intact. Facial sensation intact. Face, tongue, palate moves normally and symmetrically.  Motor: Normal bulk and tone. Normal strength in all tested extremity muscles. Sensory.: intact to touch ,pinprick .position and vibratory sensation.  Coordination: Rapid alternating movements normal in all extremities. Finger-to-nose and heel-to-shin performed accurately bilaterally. Gait and Station: Arises from chair without difficulty. Stance is normal. Gait demonstrates normal stride length and balance . Able to heel, toe and tandem walk without difficulty.  Reflexes: 1+ and symmetric. Toes downgoing.   NIHSS  0 Modified Rankin  1    01/05/2024    1:36 PM  MMSE - Mini Mental State Exam  Orientation to time 5  Orientation to Place 4  Registration 3  Attention/ Calculation 4  Recall 1  Language- name 2 objects 2  Language- repeat 1  Language- follow 3 step command 3  Language- read & follow direction 1  Write a sentence 0  Copy design 1  Total score 25     ASSESSMENT: 68 year old Caucasian male with left MCA branch infarct in June 2025 due to left M3 occlusion from atrial fibrillation not on anticoagulation..  Treated with successful mechanical thrombectomy was doing quite well but does have mild cognitive impairment.  Vascular risk factors of chronic atrial fibrillation, congestive heart failure, hyperlipidemia, mild obesity , tobacco  abuse and diabetes.     PLAN:I had a long d/w patient about his recent stroke, chronic atrial fibrillation and congestive heart failure, mild cognitive impairment, risk for recurrent stroke/TIAs, personally independently reviewed imaging studies and stroke evaluation results and answered questions.Continue Eliquis  (apixaban ) daily  for secondary stroke prevention and maintain strict control of hypertension with blood pressure goal below 130/90, diabetes with hemoglobin A1c goal below 6.5% and lipids with LDL cholesterol goal below 70 mg/dL. I  also advised the patient to eat a healthy diet with plenty of whole grains, cereals, fruits and vegetables, exercise regularly and maintain ideal body weight.  I complemented him on his smoking cessation and advised him not to resume smoking again.  I recommend we check memory panel labs, lipid profile and hemoglobin A1c today.  Continue follow-up with cardiology for his atrial fibrillation and heart failure.  I encouraged him to increase participation in cognitively challenging activities like solving crossword puzzles, playing bridge and sudoku.  We also discussed memory compensation strategies.  Followup in the future with my nurse practitioner in 6 months or call earlier if necessary.    I personally spent a total of 40 minutes in the care of the patient today including getting/reviewing separately obtained history, performing a medically appropriate exam/evaluation, counseling and educating, placing orders, referring and communicating with other health care professionals, documenting clinical information in the EHR, independently interpreting results, and coordinating care.       Eather Popp, MD  Note: This document was prepared with digital dictation and possible smart phrase technology. Any transcriptional errors that result from this process are unintentional

## 2024-01-05 NOTE — Progress Notes (Addendum)
 PCP - Philippe Slade, NP Cardiologist - Dr Alm Clay (clearance Damien Braver, NP on 12/23/23) EP - Dr Soyla Norton Pulmonary - Lauraine Lites, NP  CT Chest x-ray - 01/01/24 EKG - 12/09/23 Stress Test - 09/13/21 ECHO - 07/12/23 Cardiac Cath - 04/2015  [facility] Scientific ICD - Device Orders requested, have magnet for surgery.  Rep Magdalene was informed of surgery and will be available if needed. Magnet to be used during surgery.  Last remote check was on 12/09/23.  Michael in Endoscopy Dept was also notified.  Sleep Study -  n/a  Diabetes Type 2, does not check blood sugar Do not take Metformin on the morning of surgery.  Eliquis  Instructions:  Hold 2 days prior to procedure per MD.  Last dose will be on Saturday, 01/03/24.  Farxiga  - Hold for 3 days prior to procedure.  Last dose should be on Saturday, 01/03/24.  Patient states last dose was Sunday, 01/04/24  Aspirin Instructions: n/a  NPO   Anesthesia review: Yes   STOP now taking any Aspirin (unless otherwise instructed by your surgeon), Aleve, Naproxen, Ibuprofen, Motrin, Advil, Goody's, BC's, all herbal medications, fish oil, and all vitamins.   Coronavirus Screening Do you have any of the following symptoms:  Cough yes/no: No Fever (>100.31F)  yes/no: No Runny nose yes/no: No Sore throat yes/no: No Difficulty breathing/shortness of breath- yes with exertion  Have you traveled in the last 14 days and where? yes/no: No  Patient verbalized understanding of instructions that were given via phone.

## 2024-01-05 NOTE — Anesthesia Preprocedure Evaluation (Signed)
 Anesthesia Evaluation  Patient identified by MRN, date of birth, ID band Patient awake    Reviewed: Allergy & Precautions, NPO status , Patient's Chart, lab work & pertinent test results  History of Anesthesia Complications Negative for: history of anesthetic complications  Airway Mallampati: I  TM Distance: >3 FB Neck ROM: Full    Dental  (+) Dental Advisory Given, Teeth Intact   Pulmonary shortness of breath, COPD, former smoker   breath sounds clear to auscultation       Cardiovascular hypertension, Pt. on medications and Pt. on home beta blockers (-) angina + Peripheral Vascular Disease and +CHF  (-) Past MI + dysrhythmias Atrial Fibrillation  Rhythm:Regular  1. Left ventricular ejection fraction, by estimation, is 25 to 30%. The  left ventricle has severely decreased function. The left ventricle  demonstrates regional wall motion abnormalities (see scoring  diagram/findings for description). There is mild left   ventricular hypertrophy. Left ventricular diastolic parameters are  consistent with Grade I diastolic dysfunction (impaired relaxation).   2. Right ventricular systolic function is normal. The right ventricular  size is mildly enlarged. There is normal pulmonary artery systolic  pressure. The estimated right ventricular systolic pressure is 26.8 mmHg.   3. Left atrial size was mildly dilated.   4. Right atrial size was mildly dilated.   5. The mitral valve is normal in structure. Trivial mitral valve  regurgitation. No evidence of mitral stenosis.   6. The aortic valve is tricuspid. Aortic valve regurgitation is not  visualized. No aortic stenosis is present.   7. Aortic dilatation noted. There is dilatation of the ascending aorta,  measuring 40 mm.   8. The inferior vena cava is normal in size with greater than 50%  respiratory variability, suggesting right atrial pressure of 3 mmHg.     Neuro/Psych CVA, No  Residual Symptoms    GI/Hepatic negative GI ROS, Neg liver ROS,,,  Endo/Other  diabetes, Type 2, Oral Hypoglycemic Agents    Renal/GU negative Renal ROSLab Results      Component                Value               Date                      NA                       141                 12/11/2023                K                        5.0                 12/11/2023                CO2                      28                  12/11/2023                GLUCOSE                  98  12/11/2023                BUN                      26 (H)              12/11/2023                CREATININE               1.12                12/11/2023                CALCIUM                   9.8                 12/11/2023                GFR                      69.86               10/28/2023                GFRNONAA                 >60                 12/11/2023                Musculoskeletal negative musculoskeletal ROS (+)    Abdominal   Peds  Hematology Lab Results      Component                Value               Date                      WBC                      12.7 (H)            12/11/2023                HGB                      17.2 (H)            12/11/2023                HCT                      52.6 (H)            12/11/2023                MCV                      94.8                12/11/2023                PLT                      172                 12/11/2023  Anesthesia Other Findings History includes former smoker (quit 12/12/2023), HTN, HLC, CAD, NICM (11/2014), chronic combined CHF (diagnosed with dilated CM, EF 15% ~ 11/2014; 30-35% 2021,25-30% 06/2023), NSVT (04/2015 terminated by ICD), ICD (05/31/2015, [employer] CRT-D [facility], [place]), CVA (07/15/2023 in setting of afib with missed Eliquis , s/p successful mechanical thrombectomy of left MCA thrombus; mild cognitive impairment), DM2, PE (right PE 12/18/2024), DVT (RILE 12/19/2014), afib (diagnosed  12/2020), LBBB, exertional dyspnea, PVD, Buerger's disease, Graves disease/hyperthyroidism (on methimazole ).      Preoperative cardiology input outlined by Daneen Perkins, NP on 12/23/2023: Chart reviewed as part of pre-operative protocol coverage.  Patient was last seen in the office on 12/17/2023 by Dr. Anner.  Per Dr. Anner, patient okay to proceed with biopsy. GDMT limited by hypotension. Other than exertional dyspnea, fairly asymptomatic of CHF. Obesity, deconditioning, and smoking also felt to be contributing to dyspnea. Six month follow-up planned.     Per office protocol, patient can hold Eliquis  for 2 days prior to procedure. Please resume Eliquis  as soon as possible postprocedure, at the discretion of the surgeon.    EP follow-up with Dr. Inocencio on 12/09/2023. Normal device function. Euvolemic. No changes made. Minimal PAF on interrogation. He continued to have PVCs and non-sustained VT, some treated with ATP and has history of ICD shock. If he received additional ICD shocks then he will likely need antiarrythmic therapy. 6 month follow-up planned.    Reproductive/Obstetrics                              Anesthesia Physical Anesthesia Plan  ASA: 3  Anesthesia Plan: General   Post-op Pain Management: Minimal or no pain anticipated   Induction: Intravenous  PONV Risk Score and Plan: 2 and Ondansetron , Dexamethasone , Propofol  infusion and TIVA  Airway Management Planned: Oral ETT  Additional Equipment: None  Intra-op Plan:   Post-operative Plan: Extubation in OR  Informed Consent: I have reviewed the patients History and Physical, chart, labs and discussed the procedure including the risks, benefits and alternatives for the proposed anesthesia with the patient or authorized representative who has indicated his/her understanding and acceptance.     Dental advisory given  Plan Discussed with: CRNA  Anesthesia Plan Comments: (See PAT note  written 01/05/2024 by Earla Charlie, PA-C.  )         Anesthesia Quick Evaluation

## 2024-01-05 NOTE — Patient Instructions (Signed)
 I had a long d/w patient about his recent stroke, chronic atrial fibrillation and congestive heart failure, mild cognitive impairment, risk for recurrent stroke/TIAs, personally independently reviewed imaging studies and stroke evaluation results and answered questions.Continue Eliquis  (apixaban ) daily  for secondary stroke prevention and maintain strict control of hypertension with blood pressure goal below 130/90, diabetes with hemoglobin A1c goal below 6.5% and lipids with LDL cholesterol goal below 70 mg/dL. I also advised the patient to eat a healthy diet with plenty of whole grains, cereals, fruits and vegetables, exercise regularly and maintain ideal body weight.  I recommend we check memory panel labs, lipid profile and hemoglobin A1c today.  I encouraged him to increase participation in cognitively challenging activities like solving crossword puzzles, playing bridge and sudoku.  We also discussed memory compensation strategies.  Followup in the future with my nurse practitioner in 6 months or call earlier if necessary.  Memory Compensation Strategies  Use WARM strategy.  W= write it down  A= associate it  R= repeat it  M= make a mental note  2.   You can keep a Glass Blower/designer.  Use a 3-ring notebook with sections for the following: calendar, important names and phone numbers,  medications, doctors' names/phone numbers, lists/reminders, and a section to journal what you did  each day.   3.    Use a calendar to write appointments down.  4.    Write yourself a schedule for the day.  This can be placed on the calendar or in a separate section of the Memory Notebook.  Keeping a  regular schedule can help memory.  5.    Use medication organizer with sections for each day or morning/evening pills.  You may need help loading it  6.    Keep a basket, or pegboard by the door.  Place items that you need to take out with you in the basket or on the pegboard.  You may also want to  include a  message board for reminders.  7.    Use sticky notes.  Place sticky notes with reminders in a place where the task is performed.  For example:  turn off the  stove placed by the stove, lock the door placed on the door at eye level,  take your medications on  the bathroom mirror or by the place where you normally take your medications.  8.    Use alarms/timers.  Use while cooking to remind yourself to check on food or as a reminder to take your medicine, or as a  reminder to make a call, or as a reminder to perform another task, etc.

## 2024-01-06 ENCOUNTER — Ambulatory Visit: Admitting: Family Medicine

## 2024-01-06 ENCOUNTER — Encounter (HOSPITAL_COMMUNITY): Payer: Self-pay | Admitting: Emergency Medicine

## 2024-01-06 ENCOUNTER — Encounter: Payer: Self-pay | Admitting: Anesthesiology

## 2024-01-06 ENCOUNTER — Other Ambulatory Visit: Payer: Self-pay

## 2024-01-06 ENCOUNTER — Ambulatory Visit (HOSPITAL_COMMUNITY): Payer: Self-pay | Admitting: Vascular Surgery

## 2024-01-06 ENCOUNTER — Ambulatory Visit (HOSPITAL_COMMUNITY)

## 2024-01-06 ENCOUNTER — Ambulatory Visit
Admission: RE | Admit: 2024-01-06 | Discharge: 2024-01-06 | Disposition: A | Attending: Emergency Medicine | Admitting: Ophthalmology

## 2024-01-06 ENCOUNTER — Encounter: Admission: RE | Disposition: A | Payer: Self-pay | Attending: Emergency Medicine

## 2024-01-06 DIAGNOSIS — I110 Hypertensive heart disease with heart failure: Secondary | ICD-10-CM

## 2024-01-06 DIAGNOSIS — R918 Other nonspecific abnormal finding of lung field: Secondary | ICD-10-CM | POA: Diagnosis not present

## 2024-01-06 DIAGNOSIS — I480 Paroxysmal atrial fibrillation: Secondary | ICD-10-CM

## 2024-01-06 DIAGNOSIS — I5032 Chronic diastolic (congestive) heart failure: Secondary | ICD-10-CM

## 2024-01-06 DIAGNOSIS — Z87891 Personal history of nicotine dependence: Secondary | ICD-10-CM

## 2024-01-06 HISTORY — PX: VIDEO BRONCHOSCOPY WITH ENDOBRONCHIAL ULTRASOUND: SHX6177

## 2024-01-06 HISTORY — DX: Other amnesia: R41.3

## 2024-01-06 HISTORY — PX: VIDEO BRONCHOSCOPY WITH ENDOBRONCHIAL NAVIGATION: SHX6175

## 2024-01-06 HISTORY — DX: Dyspnea, unspecified: R06.00

## 2024-01-06 LAB — GLUCOSE, CAPILLARY
Glucose-Capillary: 137 mg/dL — ABNORMAL HIGH (ref 70–99)
Glucose-Capillary: 134 mg/dL — ABNORMAL HIGH (ref 70–99)

## 2024-01-06 SURGERY — VIDEO BRONCHOSCOPY WITH ENDOBRONCHIAL NAVIGATION
Anesthesia: General | Laterality: Left

## 2024-01-06 MED ORDER — PHENYLEPHRINE HCL-NACL 20-0.9 MG/250ML-% IV SOLN
INTRAVENOUS | Status: DC | PRN
  Administered 2024-01-06: 20 ug/min via INTRAVENOUS

## 2024-01-06 MED ORDER — EPINEPHRINE 1 MG/10ML IV SOSY
PREFILLED_SYRINGE | INTRAVENOUS | Status: DC | PRN
  Administered 2024-01-06: 8 ug via INTRAVENOUS

## 2024-01-06 MED ORDER — LIDOCAINE 2% (20 MG/ML) 5 ML SYRINGE
INTRAMUSCULAR | Status: DC | PRN
  Administered 2024-01-06: 60 mg via INTRAVENOUS

## 2024-01-06 MED ORDER — DEXAMETHASONE SOD PHOSPHATE PF 10 MG/ML IJ SOLN
INTRAMUSCULAR | Status: DC | PRN
  Administered 2024-01-06: 10 mg via INTRAVENOUS

## 2024-01-06 MED ORDER — PROPOFOL 10 MG/ML IV BOLUS
INTRAVENOUS | Status: DC | PRN
  Administered 2024-01-06: 100 ug/kg/min via INTRAVENOUS
  Administered 2024-01-06: 100 mg via INTRAVENOUS
  Administered 2024-01-06: 30 mg via INTRAVENOUS

## 2024-01-06 MED ORDER — LACTATED RINGERS IV SOLN: INTRAVENOUS | Status: DC

## 2024-01-06 MED ORDER — INSULIN ASPART 100 UNIT/ML IJ SOLN: 0.0000 [IU] | INTRAMUSCULAR | Status: DC | PRN

## 2024-01-06 MED ORDER — ROCURONIUM BROMIDE 10 MG/ML (PF) SYRINGE
PREFILLED_SYRINGE | INTRAVENOUS | Status: DC | PRN
  Administered 2024-01-06: 20 mg via INTRAVENOUS
  Administered 2024-01-06: 60 mg via INTRAVENOUS
  Administered 2024-01-06: 20 mg via INTRAVENOUS

## 2024-01-06 MED ORDER — CHLORHEXIDINE GLUCONATE 0.12 % MT SOLN
15.0000 mL | Freq: Once | OROMUCOSAL | Status: AC
  Administered 2024-01-06: 15 mL via OROMUCOSAL
  Filled 2024-01-06: qty 15

## 2024-01-06 MED ORDER — PHENYLEPHRINE 80 MCG/ML (10ML) SYRINGE FOR IV PUSH (FOR BLOOD PRESSURE SUPPORT)
PREFILLED_SYRINGE | INTRAVENOUS | Status: DC | PRN
  Administered 2024-01-06: 160 ug via INTRAVENOUS

## 2024-01-06 MED ORDER — ONDANSETRON HCL 4 MG/2ML IJ SOLN
INTRAMUSCULAR | Status: DC | PRN
  Administered 2024-01-06: 4 mg via INTRAVENOUS

## 2024-01-06 MED ORDER — NOREPINEPHRINE BITARTRATE 1 MG/ML IV SOLN
INTRAVENOUS | Status: DC | PRN
  Administered 2024-01-06: .5 mL via INTRAVENOUS

## 2024-01-06 MED ORDER — NOREPINEPHRINE 4 MG/250ML-% IV SOLN
INTRAVENOUS | Status: DC | PRN
  Administered 2024-01-06: 2 ug/min via INTRAVENOUS

## 2024-01-06 MED ORDER — SUGAMMADEX SODIUM 200 MG/2ML IV SOLN
INTRAVENOUS | Status: DC | PRN
  Administered 2024-01-06: 400 mg via INTRAVENOUS

## 2024-01-06 SURGICAL SUPPLY — 35 items
ADAPTER BRONCHOSCOPE OLYMPUS (ADAPTER) ×2 IMPLANT
ADAPTER VALVE BIOPSY EBUS (MISCELLANEOUS) IMPLANT
BAG COUNTER SPONGE SURGICOUNT (BAG) ×2 IMPLANT
BRUSH CYTOL CELLEBRITY 1.5X140 (MISCELLANEOUS) ×2 IMPLANT
BRUSH SUPERTRAX BIOPSY (INSTRUMENTS) IMPLANT
BRUSH SUPERTRAX NDL-TIP CYTO (INSTRUMENTS) ×2 IMPLANT
CANISTER SUCTION 3000ML PPV (SUCTIONS) ×2 IMPLANT
CNTNR URN SCR LID CUP LEK RST (MISCELLANEOUS) ×2 IMPLANT
COVER BACK TABLE 60X90IN (DRAPES) ×2 IMPLANT
FILTER STRAW FLUID ASPIR (MISCELLANEOUS) IMPLANT
FORCEPS BIOP 1.5 SINGLE USE (MISCELLANEOUS) ×2 IMPLANT
FORCEPS BIOP SUPERTRX PREMAR (INSTRUMENTS) ×2 IMPLANT
GAUZE SPONGE 4X4 12PLY STRL (GAUZE/BANDAGES/DRESSINGS) ×2 IMPLANT
GLOVE BIO SURGEON STRL SZ7.5 (GLOVE) ×4 IMPLANT
GOWN STRL REUS W/ TWL LRG LVL3 (GOWN DISPOSABLE) ×4 IMPLANT
KIT CLEAN ENDO COMPLIANCE (KITS) ×2 IMPLANT
KIT LOCATABLE GUIDE (CANNULA) IMPLANT
KIT MARKER FIDUCIAL DELIVERY (KITS) IMPLANT
KIT TURNOVER KIT B (KITS) ×2 IMPLANT
MARKER SKIN DUAL TIP RULER LAB (MISCELLANEOUS) ×2 IMPLANT
NDL SUPERTRX PREMARK BIOPSY (NEEDLE) ×2 IMPLANT
OIL SILICONE PENTAX (PARTS (SERVICE/REPAIRS)) ×2 IMPLANT
PAD ARMBOARD POSITIONER FOAM (MISCELLANEOUS) ×4 IMPLANT
PATCHES PATIENT (LABEL) ×6 IMPLANT
SOLN 0.9% NACL POUR BTL 1000ML (IV SOLUTION) ×2 IMPLANT
SOLN STERILE WATER BTL 1000 ML (IV SOLUTION) ×2 IMPLANT
SYR 20ML ECCENTRIC (SYRINGE) ×2 IMPLANT
SYR 20ML LL LF (SYRINGE) ×2 IMPLANT
SYR 50ML SLIP (SYRINGE) ×2 IMPLANT
TOWEL GREEN STERILE FF (TOWEL DISPOSABLE) ×2 IMPLANT
TRAP SPECIMEN MUCUS 40CC (MISCELLANEOUS) IMPLANT
TUBE CONNECTING 20X1/4 (TUBING) ×2 IMPLANT
UNDERPAD 30X36 HEAVY ABSORB (UNDERPADS AND DIAPERS) ×2 IMPLANT
VALVE BIOPSY SINGLE USE (MISCELLANEOUS) ×2 IMPLANT
VALVE SUCTION BRONCHIO DISP (MISCELLANEOUS) ×2 IMPLANT

## 2024-01-06 NOTE — Op Note (Signed)
 Procedure Note  Patient: Henry Schneider  [employer] Cios mobile C-arm was utilized to identify and biopsy a left lower lobe pleural-based mass.  Needle-in-lesion was confirmed using real-time Cios imaging, and images were uploaded to PACS.     Lamar Chris, MD, PhD 01/06/2024, 3:02 PM [facility] [phone] or if no answer before 7:00PM call [phone] For any issues after 7:00PM please call eLink [phone]

## 2024-01-06 NOTE — Discharge Instructions (Signed)
 Flexible Bronchoscopy, Care After This sheet gives you information about how to care for yourself after your test. Your doctor may also give you more specific instructions. If you have problems or questions, contact your doctor. Follow these instructions at home: Eating and drinking When you are wide awake, your numbness is gone and your cough and gag reflexes have come back, you may: Start eating only soft foods. Slowly drink liquids. Six hours after the test, go back to your normal diet. Driving Do not drive for 24 hours if you were given a medicine to help you relax (sedative). Do not drive or use heavy machinery while taking prescription pain medicine. General instructions Take over-the-counter and prescription medicines only as told by your doctor. Return to your normal activities as told. Ask what activities are safe for you. Do not use any products that have nicotine  or tobacco in them. This includes cigarettes and e-cigarettes. If you need help quitting, ask your doctor. Keep all follow-up visits as told by your doctor. This is important. It is very important if you had a tissue sample (biopsy) taken. Get help right away if: You have shortness of breath that gets worse. You get light-headed. You feel like you are going to pass out (faint). You have chest pain. You cough up: More than a little blood. More blood than before. Summary Do not use cigarettes. Do not use e-cigarettes. Seek care in the Emergency Department right away if you have chest pain or shortness of breath. Call or MyChart Message our office for any questions or problems at [phone].  Okay to restart Eliquis  on 01/07/2024.   This information is not intended to replace advice given to you by your health care provider. Make sure you discuss any questions you have with your health care provider.

## 2024-01-06 NOTE — Interval H&P Note (Signed)
 History and Physical Interval Note:  01/06/2024 12:44 PM  Henry Schneider  has presented today for surgery, with the diagnosis of lung nodules.  The various methods of treatment have been discussed with the patient and family. After consideration of risks, benefits and other options for treatment, the patient has consented to  Procedure(s): VIDEO BRONCHOSCOPY WITH ENDOBRONCHIAL NAVIGATION (Left) as a surgical intervention.  The patient's history has been reviewed, patient examined, no change in status, stable for surgery.  I have reviewed the patient's chart and labs.  Questions were answered to the patient's satisfaction.     Lamar GORMAN Chris

## 2024-01-06 NOTE — Op Note (Signed)
 Video Bronchoscopy with Endobronchial Ultrasound and Electromagnetic Navigation Procedure Note  Date of Operation: 01/06/2024  Pre-op Diagnosis: Left lower lobe pleural-based mass, mediastinal adenopathy  Post-op Diagnosis: Same  Surgeon: LAMAR CHRIS  Assistants: None  Anesthesia: General endotracheal anesthesia  Operation: Flexible video fiberoptic bronchoscopy with endobronchial ultrasound, robotic assisted navigation and biopsies.  Estimated Blood Loss: Minimal  Complications: None apparent  Indications and History: Henry Schneider is a 68 y.o. male with tobacco use and with history of coronary disease and a nonischemic cardiomyopathy, history of PE, history of CVA, who was found to have a new right renal mass and pulmonary nodules on chest imaging most prominently a left lower lobe pleural-based mass.  Recommendation was made to achieve a tissue diagnosis using endobronchial ultrasound and robotic assisted navigational bronchoscopy. The risks, benefits, complications, treatment options and expected outcomes were discussed with the patient.  The possibilities of pneumothorax, pneumonia, reaction to medication, pulmonary aspiration, perforation of a viscus, bleeding, failure to diagnose a condition and creating a complication requiring transfusion or operation were discussed with the patient who freely signed the consent.    Description of Procedure: The patient was seen in the Preoperative Area, was examined and was deemed appropriate to proceed.  The patient was taken to [facility] Endoscopy room 3, identified as Henry Schneider and the procedure verified as Flexible Video Fiberoptic Bronchoscopy with robotic assisted navigation and endobronchial ultrasound.  A Time Out was held and the above information confirmed.   Robotic assisted navigation: Prior to the date of the procedure a high-resolution CT scan of the chest was performed. Utilizing ION software program a virtual tracheobronchial tree  was generated to allow the creation of distinct navigation pathways to the patient's parenchymal abnormalities. After being taken to the operating room general anesthesia was initiated and the patient  was orally intubated. The video fiberoptic bronchoscope was introduced via the endotracheal tube and a general inspection was performed which showed normal right and left lung anatomy. Aspiration of the bilateral mainstems was completed to remove any remaining secretions. Robotic catheter inserted into patient's endotracheal tube.   Target #1 left lower lobe mass: The distinct navigation pathways prepared prior to this procedure were then utilized to navigate to patient's lesion identified on CT scan. The robotic catheter was secured into place and the vision probe was withdrawn.  Lesion location was approximated using fluoroscopy.  Local registration and targeting was performed using [employer] Cios mobile C-arm three-dimensional imaging. Under fluoroscopic guidance transbronchial needle biopsies and transbronchial cryoprobe biopsies were performed to be sent for cytology and pathology.  Needle-in-lesion was confirmed using Cios mobile C-arm.    Endobronchial ultrasound: The robotic scope was then withdrawn and the endobronchial ultrasound was used to identify and characterize the peritracheal, hilar and bronchial lymph nodes. Inspection showed no significant enlarged lymph nodes at station 4L or 4R.  There was a slightly enlarged 0.5 cm node station 7. Using real-time ultrasound guidance Wang needle biopsies were take from Station 7 nodes and were sent for cytology.   At the end of the procedure a general airway inspection was performed and there was no evidence of active bleeding. The bronchoscope was removed.  The patient tolerated the procedure well. There was no significant blood loss and there were no obvious complications. A post-procedural chest x-ray is pending.  Samples Target #1: 1.  Transbronchial Wang needle biopsies from left lower lobe mass 2. Transbronchial forceps biopsies from left lower lobe mass   EBUS Samples: 1. Wang needle biopsies  from 7 node   Lamar Chris, MD, PhD 01/06/2024, 2:54 PM [facility]

## 2024-01-06 NOTE — Anesthesia Procedure Notes (Signed)
 Procedure Name: Intubation Date/Time: 01/06/2024 1:20 PM  Performed by: Hedy Jarred, CRNAPre-anesthesia Checklist: Patient identified, Emergency Drugs available, Suction available and Patient being monitored Patient Re-evaluated:Patient Re-evaluated prior to induction Oxygen Delivery Method: Circle System Utilized Preoxygenation: Pre-oxygenation with 100% oxygen Induction Type: IV induction Ventilation: Mask ventilation without difficulty Laryngoscope Size: Mac and 4 Grade View: Grade I Tube type: Oral Tube size: 8.5 mm Number of attempts: 1 Airway Equipment and Method: Stylet and Oral airway Placement Confirmation: ETT inserted through vocal cords under direct vision, positive ETCO2 and breath sounds checked- equal and bilateral Secured at: 23 cm Tube secured with: Tape Dental Injury: Teeth and Oropharynx as per pre-operative assessment

## 2024-01-06 NOTE — Transfer of Care (Signed)
 Immediate Anesthesia Transfer of Care Note  Patient: Henry Schneider  Procedure(s) Performed: VIDEO BRONCHOSCOPY WITH ENDOBRONCHIAL NAVIGATION (Left) BRONCHOSCOPY, WITH EBUS  Patient Location: PACU  Anesthesia Type:General  Level of Consciousness: awake, alert , and oriented  Airway & Oxygen Therapy: Patient Spontanous Breathing and Patient connected to face mask oxygen  Post-op Assessment: Report given to RN and Post -op Vital signs reviewed and stable  Post vital signs: Reviewed and stable  Last Vitals:  Vitals Value Taken Time  BP 95/72 01/06/24 15:34  Temp 36.5 C 01/06/24 15:07  Pulse 57 01/06/24 15:34  Resp 19 01/06/24 15:33  SpO2 91 % 01/06/24 15:34  Vitals shown include unfiled device data.  Last Pain:  Vitals:   01/06/24 1530  TempSrc:   PainSc: 0-No pain      Patients Stated Pain Goal: 0 (01/06/24 1013)  Complications: No notable events documented.

## 2024-01-07 LAB — DEMENTIA PANEL
Homocysteine: 13.9 umol/L (ref 0.0–17.2)
RPR Ser Ql: NONREACTIVE
TSH: 0.527 u[IU]/mL (ref 0.450–4.500)
Vitamin B-12: 817 pg/mL (ref 232–1245)

## 2024-01-07 LAB — LIPID PANEL
Chol/HDL Ratio: 2.9 ratio (ref 0.0–5.0)
Cholesterol, Total: 153 mg/dL (ref 100–199)
HDL: 52 mg/dL (ref >39)
LDL Chol Calc (NIH): 83 mg/dL (ref 0–99)
Triglycerides: 98 mg/dL (ref 0–149)
VLDL Cholesterol Cal: 18 mg/dL (ref 5–40)

## 2024-01-07 LAB — HEMOGLOBIN A1C
Est. average glucose Bld gHb Est-mCnc: 148 mg/dL
Hgb A1c MFr Bld: 6.8 % — ABNORMAL HIGH (ref 4.8–5.6)

## 2024-01-08 ENCOUNTER — Ambulatory Visit: Payer: Self-pay | Admitting: Neurology

## 2024-01-08 ENCOUNTER — Other Ambulatory Visit: Payer: Self-pay | Admitting: Neurology

## 2024-01-08 LAB — CYTOLOGY - NON PAP

## 2024-01-08 MED ORDER — ATORVASTATIN CALCIUM 80 MG PO TABS: 80.0000 mg | ORAL_TABLET | Freq: Every day | ORAL | 3 refills | Status: AC

## 2024-01-08 NOTE — Anesthesia Postprocedure Evaluation (Signed)
 Anesthesia Post Note  Patient: Henry Schneider  Procedure(s) Performed: VIDEO BRONCHOSCOPY WITH ENDOBRONCHIAL NAVIGATION (Left) BRONCHOSCOPY, WITH EBUS     Patient location during evaluation: PACU Anesthesia Type: General Level of consciousness: awake and alert Pain management: pain level controlled Vital Signs Assessment: post-procedure vital signs reviewed and stable Respiratory status: spontaneous breathing, nonlabored ventilation, respiratory function stable and patient connected to nasal cannula oxygen Cardiovascular status: blood pressure returned to baseline and stable Postop Assessment: no apparent nausea or vomiting Anesthetic complications: no   No notable events documented.  Last Vitals:  Vitals:   01/06/24 1520 01/06/24 1530  BP: 90/62 95/72  Pulse: 62 61  Resp: 17 12  Temp:    SpO2: 91% 92%    Last Pain:  Vitals:   01/06/24 1530  TempSrc:   PainSc: 0-No pain                 Cordella P Mandeep Ferch

## 2024-01-12 ENCOUNTER — Ambulatory Visit: Admitting: Acute Care

## 2024-01-12 NOTE — Progress Notes (Signed)
 Agree with the assessment and plan as outlined by Ellouise Console, PA-C.  Would not recommend pursuing surveillance colonoscopy at this time.  Patient has likely noncolonic malignancy and has numerous serious comorbidities that place him at increased risk of sedation complications for a screening procedure.   Ok to follow up with me in a few months and reassess candidacy for surveillance colonoscopy

## 2024-01-14 ENCOUNTER — Encounter: Payer: Self-pay | Admitting: Emergency Medicine

## 2024-01-14 ENCOUNTER — Telehealth: Payer: Self-pay

## 2024-01-14 ENCOUNTER — Ambulatory Visit: Admitting: Emergency Medicine

## 2024-01-14 ENCOUNTER — Other Ambulatory Visit

## 2024-01-14 ENCOUNTER — Encounter: Payer: Self-pay | Admitting: Radiology

## 2024-01-14 VITALS — BP 102/68 | HR 80 | Temp 97.5°F | Ht 72.0 in | Wt 276.0 lb

## 2024-01-14 DIAGNOSIS — C641 Malignant neoplasm of right kidney, except renal pelvis: Secondary | ICD-10-CM

## 2024-01-14 DIAGNOSIS — R918 Other nonspecific abnormal finding of lung field: Secondary | ICD-10-CM | POA: Diagnosis not present

## 2024-01-14 DIAGNOSIS — Z87891 Personal history of nicotine dependence: Secondary | ICD-10-CM | POA: Diagnosis not present

## 2024-01-14 NOTE — Patient Instructions (Signed)
 We reviewed her bronchoscopy results today.  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 Based on this we can cancel the planned right kidney biopsy. We will refer you immediately to discuss with urology and with medical oncology so we can plan appropriate treatment.

## 2024-01-14 NOTE — Progress Notes (Signed)
° °  01/14/2024  Patient ID: Garnette Arn, male   DOB: 1956/01/09, 68 y.o.   MRN: [id number]  Attempted to contact patient for scheduled appointment for smoking cessation. Left HIPAA compliant message for patient to return my call at their convenience.   Jon VEAR Lindau, PharmD Clinical Pharmacist [phone]

## 2024-01-14 NOTE — Assessment & Plan Note (Addendum)
 Bronchoscopy done 01/06/2024 confirmed metastatic renal cell.  Based on this I will get him urgently to urology, we will discuss with Dr. Sherrod and possibly refer him to see Dr. Tina with medical oncology.  He should not need the renal biopsy based on these results so I will work on canceling.  He may be a candidate at some point going forward for PEF ablation.  I can discuss with Dr. Sherrod and Dr. Tina depending on how his treatment plan unfolds.  He can follow-up with me as needed.

## 2024-01-14 NOTE — Progress Notes (Signed)
 Subjective:    Patient ID: Henry Schneider, male    DOB: May 13, 1955, 68 y.o.   MRN: [id number]  HPI 68 year old gentleman with a history of tobacco use, hypertension and atrial fibrillation, CAD, combined systolic and diastolic CHF, diabetes, pulmonary embolism, CVA with expressive aphasia.  He was found to have pleural-based pulmonary nodules and a right renal mass on lung cancer screening CT chest.  He underwent navigational bronchoscopy 01/06/2024 and is here to review results. He feels well. No urinary sx. No respiratory sx.   Robotic assisted navigational bronchoscopy cytology left lower lobe >> shows metastatic renal cell carcinoma.  Station 7 lymph node was negative.   Review of Systems As per HPI  Past Medical History:  Diagnosis Date   A-fib [facility])    Acute ischemic left middle cerebral artery (MCA) stroke (HCC) 07/15/2023   aphasia   Aphasia due to acute cerebrovascular accident (CVA) (HCC)    Buerger's disease    Cardiac LV ejection fraction 21-30% 07/12/2023   June 2020, EF 25-30%. while in 2016, EF was only 15%   Chronic combined systolic and diastolic CHF (congestive heart failure) (HCC)    Congestive heart failure (CHF) (HCC)    Diabetes mellitus without complication (HCC)    type 2   Dyspnea    with exertion   Emphysema of lung (HCC)    Grade I diastolic dysfunction    Graves disease    Heart disease    Hyperlipidemia    Hypertension    ICD (implantable cardioverter-defibrillator) in place, battery check 09-15-23    s/p [facility] CRT-D placement   LBBB (left bundle branch block)    Memory loss, short term    Non-ischemic cardiomyopathy (HCC)    NSVT (nonsustained ventricular tachycardia) (HCC)    NYHA Class III cardiovascular function (HCC)    Obesity (BMI 30-39.9)    Peripheral vascular disease    Pulmonary embolism (HCC)    History of pulmonary emboli while on Eliquis ; A-fib   Stroke (HCC)    Syncope, cardiogenic    Systolic congestive heart  failure with reduced left ventricular function, NYHA class 3 (HCC)    Thyroid  disease    Ventricular tachycardia (HCC)      Family History  Problem Relation Age of Onset   Coronary artery disease Mother    Coronary artery disease Father    Cancer Brother      Social History   Socioeconomic History   Marital status: Single    Spouse name: Not on file   Number of children: 0   Years of education: Not on file   Highest education level: High school graduate  Occupational History   Not on file  Tobacco Use   Smoking status: Former    Current packs/day: 0.00    Types: Cigarettes    Quit date: 12/12/2023    Years since quitting: 0.0   Smokeless tobacco: Never  Vaping Use   Vaping status: Never Used  Substance and Sexual Activity   Alcohol use: Yes    Comment: occasional beer   Drug use: Yes   Sexual activity: Not Currently  Other Topics Concern   Not on file  Social History Narrative   Not on file   Social Drivers of Health   Tobacco Use: Medium Risk (01/14/2024)   Patient History    Smoking Tobacco Use: Former    Smokeless Tobacco Use: Never    Passive Exposure: Not on file  Financial Resource Strain: Low Risk (10/28/2023)  Overall Financial Resource Strain (CARDIA)    Difficulty of Paying Living Expenses: Not hard at all  Food Insecurity: No Food Insecurity (10/28/2023)   Epic    Worried About Programme Researcher, Broadcasting/film/video in the Last Year: Never true    Ran Out of Food in the Last Year: Never true  Transportation Needs: No Transportation Needs (10/28/2023)   Epic    Lack of Transportation (Medical): No    Lack of Transportation (Non-Medical): No  Physical Activity: Sufficiently Active (10/28/2023)   Exercise Vital Sign    Days of Exercise per Week: 6 days    Minutes of Exercise per Session: 50 min  Stress: No Stress Concern Present (10/28/2023)   [employer] of Occupational Health - Occupational Stress Questionnaire    Feeling of Stress: Not at all  Social  Connections: Socially Isolated (10/28/2023)   Social Connection and Isolation Panel    Frequency of Communication with Friends and Family: Never    Frequency of Social Gatherings with Friends and Family: Never    Attends Religious Services: Never    Database Administrator or Organizations: Yes    Attends Banker Meetings: Never    Marital Status: Never married  Intimate Partner Violence: Not At Risk (10/28/2023)   Epic    Fear of Current or Ex-Partner: No    Emotionally Abused: No    Physically Abused: No    Sexually Abused: No  Depression (PHQ2-9): Low Risk (12/24/2023)   Depression (PHQ2-9)    PHQ-2 Score: 0  Alcohol Screen: Low Risk (10/28/2023)   Alcohol Screen    Last Alcohol Screening Score (AUDIT): 1  Housing: Low Risk (10/28/2023)   Epic    Unable to Pay for Housing in the Last Year: No    Number of Times Moved in the Last Year: 1    Homeless in the Last Year: No  Utilities: Not At Risk (10/28/2023)   Epic    Threatened with loss of utilities: No  Health Literacy: Adequate Health Literacy (10/28/2023)   B1300 Health Literacy    Frequency of need for help with medical instructions: Never     Allergies[1]   Outpatient Medications Prior to Visit  Medication Sig Dispense Refill   apixaban  (ELIQUIS ) 5 MG TABS tablet Take 1 tablet (5 mg total) by mouth 2 (two) times daily. 60 tablet 0   atorvastatin  (LIPITOR) 80 MG tablet Take 1 tablet (80 mg total) by mouth daily. 30 tablet 3   buPROPion  (WELLBUTRIN  SR) 150 MG 12 hr tablet TAKE 1 TABLET BY MOUTH DAILY FOR 3 DAYS, THEN INCREASE TO 1 TABLET BY MOUTH TWICE DAILY 180 tablet 1   dapagliflozin  propanediol (FARXIGA ) 10 MG TABS tablet Take 1 tablet (10 mg total) by mouth daily. 30 tablet 0   furosemide  (LASIX ) 20 MG tablet Take 1 tablet (20 mg total) by mouth daily. 30 tablet 0   metFORMIN (GLUCOPHAGE) 500 MG tablet Take 500 mg by mouth daily.     methimazole  (TAPAZOLE ) 5 MG tablet Take 1 tablet (5 mg total) by mouth  daily. 90 tablet 0   metoprolol  succinate (TOPROL -XL) 50 MG 24 hr tablet Take 2 tablets (100mg ) by mouth in the morning and 1 tablet (50mg ) in the afternoon. 30 tablet 0   spironolactone  (ALDACTONE ) 25 MG tablet Take 0.5 tablets (12.5 mg total) by mouth daily. 30 tablet 0   No facility-administered medications prior to visit.        Objective:   Physical Exam Vitals:  01/14/24 0849  BP: 102/68  Pulse: 80  Temp: (!) 97.5 F (36.4 C)  TempSrc: Temporal  SpO2: 94%  Weight: 276 lb (125.2 kg)  Height: 6' (1.829 m)   Gen: Pleasant, overweight gentleman, in no distress, somewhat stoic affect  ENT: No lesions,  mouth clear,  oropharynx clear, no postnasal drip  Neck: No JVD, no stridor  Lungs: No use of accessory muscles, no crackles or wheezing on normal respiration, no wheeze on forced expiration  Cardiovascular: RRR, heart sounds normal, no murmur or gallops, no peripheral edema  Musculoskeletal: No deformities, no cyanosis or clubbing  Neuro: alert, awake, non focal.  He has some subtle residual aphasia  Skin: Warm, no lesions or rash       Assessment & Plan:  Multiple lung nodules on CT Bronchoscopy done 01/06/2024 confirmed metastatic renal cell.  Based on this I will get him urgently to urology, we will discuss with Dr. Sherrod and possibly refer him to see Dr. Tina with medical oncology.  He should not need the renal biopsy based on these results so I will work on canceling.  He may be a candidate at some point going forward for PEF ablation.  I can discuss with Dr. Sherrod and Dr. Tina depending on how his treatment plan unfolds.  He can follow-up with me as needed.  I personally spent a total of 30 minutes in the care of the patient today including preparing to see the patient, getting/reviewing separately obtained history, performing a medically appropriate exam/evaluation, counseling and educating, placing orders, referring and communicating with other health care  professionals, documenting clinical information in the EHR, independently interpreting results, and communicating results.   Lamar Chris, MD, PhD 01/14/2024, 10:05 AM  [facility] [phone] or if no answer before 7:00PM call [phone] For any issues after 7:00PM please call eLink [phone]       [1] No Known Allergies

## 2024-01-15 NOTE — Anesthesia Preprocedure Evaluation (Signed)
 Anesthesia Evaluation  Patient identified by MRN, date of birth, ID band Patient awake    Reviewed: Allergy & Precautions, NPO status , Patient's Chart, lab work & pertinent test results  Airway Mallampati: II  TM Distance: >3 FB Neck ROM: Full    Dental  (+) Edentulous Upper, Edentulous Lower   Pulmonary neg pulmonary ROS, former smoker   Pulmonary exam normal        Cardiovascular hypertension, +CHF  + dysrhythmias Atrial Fibrillation  Rhythm:Regular Rate:Normal     Neuro/Psych negative neurological ROS  negative psych ROS   GI/Hepatic Neg liver ROS, PUD,GERD  Medicated,,  Endo/Other  diabetes, Type 2, Oral Hypoglycemic Agents    Renal/GU   negative genitourinary   Musculoskeletal  (+) Arthritis ,    Abdominal Normal abdominal exam  (+)   Peds  Hematology  (+) Blood dyscrasia, anemia   Anesthesia Other Findings   Reproductive/Obstetrics                             Anesthesia Physical Anesthesia Plan  ASA: 3  Anesthesia Plan: MAC and Regional   Post-op Pain Management:    Induction: Intravenous  PONV Risk Score and Plan: 1 and Ondansetron, Dexamethasone, Propofol infusion and Treatment may vary due to age or medical condition  Airway Management Planned: Simple Face Mask, Natural Airway and Nasal Cannula  Additional Equipment: None  Intra-op Plan:   Post-operative Plan:   Informed Consent: I have reviewed the patients History and Physical, chart, labs and discussed the procedure including the risks, benefits and alternatives for the proposed anesthesia with the patient or authorized representative who has indicated his/her understanding and acceptance.     Dental advisory given  Plan Discussed with: CRNA  Anesthesia Plan Comments:        Anesthesia Quick Evaluation

## 2024-01-15 NOTE — Discharge Instructions (Signed)

## 2024-01-16 ENCOUNTER — Telehealth: Payer: Self-pay

## 2024-01-16 NOTE — Telephone Encounter (Signed)
 Spoke with  urology and the referral faxed by [facility] was sent to the wrong place.   Please see referral note from Dr. Shelah Stat referral to [facility] urology for new diagnosis right renal cell carcinoma  Please fax this over ASAP. Thank you!

## 2024-01-16 NOTE — Telephone Encounter (Signed)
 Copied from CRM #[id number]. Topic: General - Other >> Jan 15, 2024  4:41 PM Rilla B wrote: Reason for CRM: Kazine from [facility] calling.  States the stat referral they keep receiving is for [facility].  She states she closed it and [facility] has opened.  Please correct in provider comment for [facility].   ATC x1. Got transferred to referral line and lvm to call me back.

## 2024-01-19 NOTE — Telephone Encounter (Signed)
 Copied from CRM #[id number]. Topic: Referral - Question >> Jan 19, 2024  3:29 PM Chantha C wrote: Reason for CRM: Patient's sister-in-law Jon [phone] states patient was referred to renal cell carcinoma from Dr. Shelah, and patient had a stroke during the summer and has aphasia, patient cannot process number. Patient missed a call from an office and cannot understand, no one has called patient to set up an appointment. Jon states the call the hematology center today and left a message. Jon states patient needs to be seen as soon as possible, patient alone and needs help and cannot process numbers, patient's family is in [place] . Informed Jon, is not on DPR and cannot provided any information. Please advise and call back patient or patient's brother Velinda [phone].   [facility] can you please provide me with the phone number to where the STAT referral was sent for Renal Cell Carcinoma

## 2024-01-20 ENCOUNTER — Other Ambulatory Visit (HOSPITAL_COMMUNITY)

## 2024-01-20 ENCOUNTER — Encounter: Payer: Self-pay | Admitting: Ophthalmology

## 2024-01-20 ENCOUNTER — Encounter: Payer: Self-pay | Admitting: Anesthesiology

## 2024-01-20 ENCOUNTER — Encounter: Admission: RE | Disposition: A | Payer: Self-pay | Source: Home / Self Care | Attending: Ophthalmology

## 2024-01-20 ENCOUNTER — Ambulatory Visit
Admission: RE | Admit: 2024-01-20 | Discharge: 2024-01-20 | Disposition: A | Discharge status: Home / Self Care | Attending: Ophthalmology | Admitting: Ophthalmology

## 2024-01-20 ENCOUNTER — Ambulatory Visit: Payer: Self-pay | Admitting: Anesthesiology

## 2024-01-20 ENCOUNTER — Other Ambulatory Visit: Payer: Self-pay

## 2024-01-20 ENCOUNTER — Encounter: Admission: RE | Payer: Self-pay

## 2024-01-20 DIAGNOSIS — H2512 Age-related nuclear cataract, left eye: Secondary | ICD-10-CM | POA: Insufficient documentation

## 2024-01-20 DIAGNOSIS — E1136 Type 2 diabetes mellitus with diabetic cataract: Secondary | ICD-10-CM | POA: Diagnosis not present

## 2024-01-20 DIAGNOSIS — Z87891 Personal history of nicotine dependence: Secondary | ICD-10-CM | POA: Diagnosis not present

## 2024-01-20 DIAGNOSIS — E1151 Type 2 diabetes mellitus with diabetic peripheral angiopathy without gangrene: Secondary | ICD-10-CM | POA: Insufficient documentation

## 2024-01-20 DIAGNOSIS — I509 Heart failure, unspecified: Secondary | ICD-10-CM | POA: Insufficient documentation

## 2024-01-20 DIAGNOSIS — I428 Other cardiomyopathies: Secondary | ICD-10-CM | POA: Insufficient documentation

## 2024-01-20 DIAGNOSIS — J449 Chronic obstructive pulmonary disease, unspecified: Secondary | ICD-10-CM | POA: Insufficient documentation

## 2024-01-20 DIAGNOSIS — I110 Hypertensive heart disease with heart failure: Secondary | ICD-10-CM | POA: Insufficient documentation

## 2024-01-20 HISTORY — PX: CATARACT EXTRACTION W/PHACO: SHX586

## 2024-01-20 LAB — GLUCOSE, CAPILLARY: Glucose-Capillary: 123 mg/dL — ABNORMAL HIGH (ref 70–99)

## 2024-01-20 SURGERY — PHACOEMULSIFICATION, CATARACT, WITH IOL INSERTION
Anesthesia: Topical | Laterality: Left

## 2024-01-20 SURGERY — PHACOEMULSIFICATION, CATARACT, WITH IOL INSERTION
Anesthesia: Monitor Anesthesia Care | Site: Eye | Laterality: Left

## 2024-01-20 MED ORDER — PHENYLEPHRINE HCL 10 % OP SOLN
OPHTHALMIC | Status: AC
  Filled 2024-01-20: qty 5

## 2024-01-20 MED ORDER — SIGHTPATH DOSE#1 BSS IO SOLN
INTRAOCULAR | Status: DC | PRN
  Administered 2024-01-20: 15 mL via INTRAOCULAR

## 2024-01-20 MED ORDER — MIDAZOLAM HCL 2 MG/2ML IJ SOLN
INTRAMUSCULAR | Status: AC
  Filled 2024-01-20: qty 2

## 2024-01-20 MED ORDER — MIDAZOLAM HCL (PF) 2 MG/2ML IJ SOLN
INTRAMUSCULAR | Status: DC | PRN
  Administered 2024-01-20: 2 mg via INTRAVENOUS

## 2024-01-20 MED ORDER — CYCLOPENTOLATE HCL 2 % OP SOLN
OPHTHALMIC | Status: AC
  Filled 2024-01-20: qty 2

## 2024-01-20 MED ORDER — LACTATED RINGERS IV SOLN: INTRAVENOUS | Status: DC

## 2024-01-20 MED ORDER — TETRACAINE HCL 0.5 % OP SOLN
OPHTHALMIC | Status: AC
  Filled 2024-01-20: qty 4

## 2024-01-20 MED ORDER — PHENYLEPHRINE HCL 10 % OP SOLN
1.0000 [drp] | OPHTHALMIC | Status: DC | PRN
  Administered 2024-01-20 (×2): 1 [drp] via OPHTHALMIC

## 2024-01-20 MED ORDER — FENTANYL CITRATE (PF) 100 MCG/2ML IJ SOLN
INTRAMUSCULAR | Status: DC | PRN
  Administered 2024-01-20: 50 ug via INTRAVENOUS

## 2024-01-20 MED ORDER — MOXIFLOXACIN HCL 0.5 % OP SOLN
OPHTHALMIC | Status: DC | PRN
  Administered 2024-01-20: .2 mL via OPHTHALMIC

## 2024-01-20 MED ORDER — BRIMONIDINE TARTRATE-TIMOLOL 0.2-0.5 % OP SOLN
OPHTHALMIC | Status: DC | PRN
  Administered 2024-01-20: 1 [drp] via OPHTHALMIC

## 2024-01-20 MED ORDER — SIGHTPATH DOSE#1 NA CHONDROIT SULF-NA HYALURON 40-17 MG/ML IO SOLN
INTRAOCULAR | Status: DC | PRN
  Administered 2024-01-20: 1 mL via INTRAOCULAR

## 2024-01-20 MED ORDER — TETRACAINE HCL 0.5 % OP SOLN
1.0000 [drp] | OPHTHALMIC | Status: DC | PRN
  Administered 2024-01-20 (×3): 1 [drp] via OPHTHALMIC

## 2024-01-20 MED ORDER — SIGHTPATH DOSE#1 BSS IO SOLN
INTRAOCULAR | Status: DC | PRN
  Administered 2024-01-20: 54 mL via OPHTHALMIC

## 2024-01-20 MED ORDER — FENTANYL CITRATE (PF) 100 MCG/2ML IJ SOLN
INTRAMUSCULAR | Status: AC
  Filled 2024-01-20: qty 2

## 2024-01-20 MED ORDER — LIDOCAINE HCL (PF) 2 % IJ SOLN
INTRAOCULAR | Status: DC | PRN
  Administered 2024-01-20: 2 mL

## 2024-01-20 MED ORDER — CYCLOPENTOLATE HCL 2 % OP SOLN
1.0000 [drp] | OPHTHALMIC | Status: DC | PRN
  Administered 2024-01-20 (×2): 1 [drp] via OPHTHALMIC

## 2024-01-20 SURGICAL SUPPLY — 10 items
CANNULA ANT/CHMB 27G (MISCELLANEOUS) ×1 IMPLANT
CYSTOTOME ANGL RVRS SHRT 25G (CUTTER) ×1 IMPLANT
FEE CATARACT SUITE SIGHTPATH (MISCELLANEOUS) ×1 IMPLANT
GLOVE BIOGEL PI IND STRL 8 (GLOVE) ×1 IMPLANT
GLOVE SURG LX STRL 8.0 MICRO (GLOVE) ×1 IMPLANT
GLOVE SURG SYN 6.5 PF PI BL (GLOVE) ×1 IMPLANT
LENS IOL TECNIS EYHANCE 18.0 (Intraocular Lens) IMPLANT
NDL FILTER BLUNT 18X1 1/2 (NEEDLE) ×1 IMPLANT
NEEDLE FILTER BLUNT 18X1 1/2 (NEEDLE) ×1 IMPLANT
SYR 3ML LL SCALE MARK (SYRINGE) ×1 IMPLANT

## 2024-01-20 NOTE — Transfer of Care (Signed)
 Immediate Anesthesia Transfer of Care Note  Patient: Henry Schneider  Procedure(s) Performed: PHACOEMULSIFICATION, CATARACT, WITH IOL INSERTION 11.63 01:05.8 (Left: Eye)  Patient Location: PACU  Anesthesia Type: MAC  Level of Consciousness: awake, alert  and patient cooperative  Airway and Oxygen Therapy: Patient Spontanous Breathing and Patient connected to supplemental oxygen  Post-op Assessment: Post-op Vital signs reviewed, Patient's Cardiovascular Status Stable, Respiratory Function Stable, Patent Airway and No signs of Nausea or vomiting  Post-op Vital Signs: Reviewed and stable  Complications: No notable events documented.

## 2024-01-20 NOTE — Anesthesia Postprocedure Evaluation (Signed)
"   Anesthesia Post Note  Patient: Henry Schneider  Procedure(s) Performed: PHACOEMULSIFICATION, CATARACT, WITH IOL INSERTION 11.63 01:05.8 (Left: Eye)  Patient location during evaluation: PACU Anesthesia Type: MAC Level of consciousness: awake and alert Pain management: pain level controlled Vital Signs Assessment: post-procedure vital signs reviewed and stable Respiratory status: spontaneous breathing, nonlabored ventilation, respiratory function stable and patient connected to nasal cannula oxygen Cardiovascular status: stable and blood pressure returned to baseline Postop Assessment: no apparent nausea or vomiting Anesthetic complications: no   No notable events documented.   Last Vitals:  Vitals:   01/20/24 0830 01/20/24 0832  BP: 94/76 97/80  Pulse: 63 63  Resp: 10 14  Temp:    SpO2: 94% 95%    Last Pain:  Vitals:   01/20/24 0824  PainSc: 0-No pain                 Elena Davia C Tannor Pyon      "

## 2024-01-20 NOTE — H&P (Signed)
 [address] [facility]   Primary Care Physician:  Billy Philippe SAUNDERS, NP Ophthalmologist: Dr. Elsie Carmine  Pre-Procedure History & Physical: HPI:  Henry Schneider is a 68 y.o. male here for cataract surgery.   Past Medical History:  Diagnosis Date   A-fib [facility])    Acute ischemic left middle cerebral artery (MCA) stroke (HCC) 07/15/2023   aphasia   Aphasia due to acute cerebrovascular accident (CVA) [facility])    Buerger's disease    Cardiac LV ejection fraction 21-30% 07/12/2023   June 2020, EF 25-30%. while in 2016, EF was only 15%   Chronic combined systolic and diastolic CHF (congestive heart failure) (HCC)    Congestive heart failure (CHF) (HCC)    Diabetes mellitus without complication (HCC)    type 2   Dyspnea    with exertion   Emphysema of lung (HCC)    Grade I diastolic dysfunction    Graves disease    Heart disease    Hyperlipidemia    Hypertension    ICD (implantable cardioverter-defibrillator) in place, battery check 09-15-23    s/p [facility] [employer] CRT-D placement   LBBB (left bundle branch block)    Memory loss, short term    Non-ischemic cardiomyopathy (HCC)    NSVT (nonsustained ventricular tachycardia) (HCC)    NYHA Class III cardiovascular function (HCC)    Obesity (BMI 30-39.9)    Peripheral vascular disease    Pulmonary embolism (HCC)    History of pulmonary emboli while on Eliquis ; A-fib   Stroke (HCC)    Syncope, cardiogenic    Systolic congestive heart failure with reduced left ventricular function, NYHA class 3 (HCC)    Thyroid  disease    Ventricular tachycardia [facility])     Past Surgical History:  Procedure Laterality Date   colonoscopy     FOOT SURGERY Right    IR CT HEAD LTD  07/12/2023   IR PERCUTANEOUS ART THROMBECTOMY/INFUSION INTRACRANIAL INC DIAG ANGIO  07/12/2023   IR US  GUIDE VASC ACCESS RIGHT  07/12/2023   RADIOLOGY WITH ANESTHESIA N/A 07/12/2023   Procedure: RADIOLOGY WITH ANESTHESIA;  Surgeon: Radiologist, Medication, MD;   Location: [facility] OR;  Service: Radiology;  Laterality: N/A;   VIDEO BRONCHOSCOPY WITH ENDOBRONCHIAL NAVIGATION Left 01/06/2024   Procedure: VIDEO BRONCHOSCOPY WITH ENDOBRONCHIAL NAVIGATION;  Surgeon: Shelah Lamar RAMAN, MD;  Location: [facility] ENDOSCOPY;  Service: Pulmonary;  Laterality: Left;   VIDEO BRONCHOSCOPY WITH ENDOBRONCHIAL ULTRASOUND  01/06/2024   Procedure: BRONCHOSCOPY, WITH EBUS;  Surgeon: Shelah Lamar RAMAN, MD;  Location: [facility] ENDOSCOPY;  Service: Pulmonary;;    Prior to Admission medications  Medication Sig Start Date End Date Taking? Authorizing Provider  apixaban  (ELIQUIS ) 5 MG TABS tablet Take 1 tablet (5 mg total) by mouth 2 (two) times daily. 07/18/23  Yes Jerilynn Daphne SAILOR, NP  atorvastatin  (LIPITOR) 80 MG tablet Take 1 tablet (80 mg total) by mouth daily. 01/08/24  Yes Rosemarie Eather RAMAN, MD  buPROPion  (WELLBUTRIN  SR) 150 MG 12 hr tablet TAKE 1 TABLET BY MOUTH DAILY FOR 3 DAYS, THEN INCREASE TO 1 TABLET BY MOUTH TWICE DAILY 12/28/23  Yes Williamson, Joanna R, NP  dapagliflozin  propanediol (FARXIGA ) 10 MG TABS tablet Take 1 tablet (10 mg total) by mouth daily. 07/18/23  Yes Jerilynn Daphne SAILOR, NP  furosemide  (LASIX ) 20 MG tablet Take 1 tablet (20 mg total) by mouth daily. 07/18/23  Yes Jerilynn Daphne SAILOR, NP  metFORMIN (GLUCOPHAGE) 500 MG tablet Take 500 mg by mouth daily. 12/27/23  Yes [provider]  methimazole  (  TAPAZOLE ) 5 MG tablet Take 1 tablet (5 mg total) by mouth daily. 11/04/23  Yes Billy Philippe SAUNDERS, NP  metoprolol  succinate (TOPROL -XL) 50 MG 24 hr tablet Take 2 tablets (100mg ) by mouth in the morning and 1 tablet (50mg ) in the afternoon. 07/18/23  Yes Jerilynn Daphne SAILOR, NP  spironolactone  (ALDACTONE ) 25 MG tablet Take 0.5 tablets (12.5 mg total) by mouth daily. 07/18/23  Yes Jerilynn Daphne SAILOR, NP    Allergies as of 01/14/2024   (No Known Allergies)    Family History  Problem Relation Age of Onset   Coronary artery disease Mother    Coronary artery disease Father     Cancer Brother     Social History   Socioeconomic History   Marital status: Single    Spouse name: Not on file   Number of children: 0   Years of education: Not on file   Highest education level: High school graduate  Occupational History   Not on file  Tobacco Use   Smoking status: Former    Current packs/day: 0.25    Average packs/day: 0.3 packs/day    Types: Cigarettes    Start date: 01/14/2024    Quit date: 12/12/2023   Smokeless tobacco: Never  Vaping Use   Vaping status: Never Used  Substance and Sexual Activity   Alcohol use: Yes    Comment: occasional beer   Drug use: Yes    Types: Marijuana    Comment: when younger   Sexual activity: Not Currently  Other Topics Concern   Not on file  Social History Narrative   Not on file   Social Drivers of Health   Tobacco Use: Medium Risk (01/20/2024)   Patient History    Smoking Tobacco Use: Former    Smokeless Tobacco Use: Never    Passive Exposure: Not on Actuary Strain: Low Risk (10/28/2023)   Overall Financial Resource Strain (CARDIA)    Difficulty of Paying Living Expenses: Not hard at all  Food Insecurity: No Food Insecurity (10/28/2023)   Epic    Worried About Programme Researcher, Broadcasting/film/video in the Last Year: Never true    Ran Out of Food in the Last Year: Never true  Transportation Needs: No Transportation Needs (10/28/2023)   Epic    Lack of Transportation (Medical): No    Lack of Transportation (Non-Medical): No  Physical Activity: Sufficiently Active (10/28/2023)   Exercise Vital Sign    Days of Exercise per Week: 6 days    Minutes of Exercise per Session: 50 min  Stress: No Stress Concern Present (10/28/2023)   [employer] of Occupational Health - Occupational Stress Questionnaire    Feeling of Stress: Not at all  Social Connections: Socially Isolated (10/28/2023)   Social Connection and Isolation Panel    Frequency of Communication with Friends and Family: Never    Frequency of Social  Gatherings with Friends and Family: Never    Attends Religious Services: Never    Database Administrator or Organizations: Yes    Attends Banker Meetings: Never    Marital Status: Never married  Intimate Partner Violence: Not At Risk (10/28/2023)   Epic    Fear of Current or Ex-Partner: No    Emotionally Abused: No    Physically Abused: No    Sexually Abused: No  Depression (PHQ2-9): Low Risk (12/24/2023)   Depression (PHQ2-9)    PHQ-2 Score: 0  Alcohol Screen: Low Risk (10/28/2023)   Alcohol Screen  Last Alcohol Screening Score (AUDIT): 1  Housing: Low Risk (10/28/2023)   Epic    Unable to Pay for Housing in the Last Year: No    Number of Times Moved in the Last Year: 1    Homeless in the Last Year: No  Utilities: Not At Risk (10/28/2023)   Epic    Threatened with loss of utilities: No  Health Literacy: Adequate Health Literacy (10/28/2023)   B1300 Health Literacy    Frequency of need for help with medical instructions: Never    Review of Systems: See HPI, otherwise negative ROS  Physical Exam: BP 114/81   Pulse 71   Temp (!) 97 F (36.1 C)   Ht 6' (1.829 m)   Wt 125.2 kg   SpO2 96%   BMI 37.43 kg/m  General:   Alert, cooperative. Head:  Normocephalic and atraumatic. Respiratory:  Normal work of breathing. Cardiovascular:  NAD  Impression/Plan: Henry Schneider is here for cataract surgery.  Risks, benefits, limitations, and alternatives regarding cataract surgery have been reviewed with the patient.  Questions have been answered.  All parties agreeable.   Elsie Carmine, MD  01/20/2024, 7:50 AM

## 2024-01-20 NOTE — Op Note (Signed)
 PREOPERATIVE DIAGNOSIS:  Nuclear sclerotic cataract of the left eye.   POSTOPERATIVE DIAGNOSIS:  Nuclear sclerotic cataract of the left eye.   OPERATIVE PROCEDURE:ORPROCALL@   SURGEON:  Elsie Carmine, MD.   ANESTHESIA:  Anesthesiologist: Ola Donny BROCKS, MD CRNA: Myra Lawless, CRNA  1.      Managed anesthesia care. 2.     0.27ml of Shugarcaine was instilled following the paracentesis   COMPLICATIONS:  None.   TECHNIQUE:   Stop and chop   DESCRIPTION OF PROCEDURE:  The patient was examined and consented in the preoperative holding area where the aforementioned topical anesthesia was applied to the left eye and then brought back to the Operating Room where the left eye was prepped and draped in the usual sterile ophthalmic fashion and a lid speculum was placed. A paracentesis was created with the side port blade and the anterior chamber was filled with viscoelastic. A near clear corneal incision was performed with the steel keratome. A continuous curvilinear capsulorrhexis was performed with a cystotome followed by the capsulorrhexis forceps. Hydrodissection and hydrodelineation were carried out with BSS on a blunt cannula. The lens was removed in a stop and chop  technique and the remaining cortical material was removed with the irrigation-aspiration handpiece. The capsular bag was inflated with viscoelastic and the intraocular lens was placed in the capsular bag without complication. The remaining viscoelastic was removed from the eye with the irrigation-aspiration handpiece. The wounds were hydrated. The anterior chamber was flushed with BSS and the eye was inflated to physiologic pressure. 0.40ml Vigamox  was placed in the anterior chamber. The wounds were found to be water tight. The eye was dressed with Combigan . The patient was given protective glasses to wear throughout the day and a shield with which to sleep tonight. The patient was also given drops with which to begin a drop regimen  today and will follow-up with me in one day. Implant Name Type Inv. Item Serial No. Manufacturer Lot No. LRB No. Used Action  LENS IOL TECNIS EYHANCE 18.0 - [id number] Intraocular Lens LENS IOL TECNIS EYHANCE 18.0 [id number] [employer]  Left 1 Implanted    Procedures: PHACOEMULSIFICATION, CATARACT, WITH IOL INSERTION 11.63 01:05.8 (Left)  Electronically signed: Elsie Carmine 01/20/2024 8:23 AM

## 2024-01-23 ENCOUNTER — Encounter: Payer: Self-pay | Admitting: Ophthalmology

## 2024-01-23 NOTE — Progress Notes (Signed)
 [facility] OFFICE PROGRESS NOTE  Henry Philippe SAUNDERS, NP [address] [place] [address]  DIAGNOSIS: Metastatic renal cell carcinoma presented with right renal mass in addition to left pleural-based lung mass and multiple smaller nodular lesion in addition to metabolic activity in the right sacroiliac joint. Diagnosed in December 2025.   PRIOR THERAPY: None  CURRENT THERAPY: Palliative systemic immunotherapy with ipilimumab and nivolumab IV every 3 weeks for 4 cycles.  This will be followed by maintenance nivolumab every 4 weeks.  His first dose is expected on 02/04/23.   INTERVAL HISTORY: Henry Schneider 68 y.o. male returns to the clinic today for a follow-up visit accompanied by his sister in law. The patient was last seen in the clinic on 12/24/2023.  The patient was being worked up for potential metastatic renal cell carcinoma.  He required biopsy results.  Therefore, he saw Dr. Shelah in the interval and underwent bronchoscopy and biopsy. The final pathology of the left lower lobe FNA was positive for metastatic renal cell carcinoma.   The patient is here today to discuss the next steps in his care.   He established care with his urologist last week.  Expected to see him back in March.  He also underwent cataract surgery and he is expected to have his second cataract surgery on 02/03/2024.  The patient denies any major changes in his health since he was last seen.  He is currently undergoing cataract surgery.  He denies fevers, chills, night sweats, nausea, vomiting, diarrhea, constipation, headaches, or vision changes. He reports preserved appetite and no dyspnea at rest. He has a chronic cough, which he attributes to tobacco use, though he quit smoking recently.   He has not noted improvement in his cough and has reduced his physical activity over the past month, despite previously walking several hours daily following a prior stroke.  He has aphasia,  impairing communication, and is accompanied by his sister-in-law for support. He lives alone. There are no reported complications from recent cataract surgery. He continues to manage Graves' disease with medication, which was recently restarted after interrupted monitoring during the COVID-19 pandemic. No symptoms of active hyperthyroidism are present.   He is here today for evaluation and for more detailed discussion about his current condition and treatment options    MEDICAL HISTORY: Past Medical History:  Diagnosis Date   A-fib [facility])    Acute ischemic left middle cerebral artery (MCA) stroke (HCC) 07/15/2023   aphasia   Aphasia due to acute cerebrovascular accident (CVA) (HCC)    Buerger's disease    Cardiac LV ejection fraction 21-30% 07/12/2023   June 2020, EF 25-30%. while in 2016, EF was only 15%   Chronic combined systolic and diastolic CHF (congestive heart failure) (HCC)    Congestive heart failure (CHF) (HCC)    Diabetes mellitus without complication (HCC)    type 2   Dyspnea    with exertion   Emphysema of lung (HCC)    Grade I diastolic dysfunction    Graves disease    Heart disease    Hyperlipidemia    Hypertension    ICD (implantable cardioverter-defibrillator) in place, battery check 09-15-23    s/p [facility] [employer] CRT-D placement   LBBB (left bundle branch block)    Memory loss, short term    Non-ischemic cardiomyopathy (HCC)    NSVT (nonsustained ventricular tachycardia) (HCC)    NYHA Class III cardiovascular function (HCC)    Obesity (BMI 30-39.9)  Peripheral vascular disease    Pulmonary embolism (HCC)    History of pulmonary emboli while on Eliquis ; A-fib   Stroke (HCC)    Syncope, cardiogenic    Systolic congestive heart failure with reduced left ventricular function, NYHA class 3 (HCC)    Thyroid  disease    Ventricular tachycardia (HCC)     ALLERGIES:  has no known allergies.  MEDICATIONS:  Current Outpatient Medications  Medication Sig  Dispense Refill   lidocaine -prilocaine (EMLA) cream Apply 1 Application topically as needed. 30 g 2   prochlorperazine  (COMPAZINE ) 10 MG tablet Take 1 tablet (10 mg total) by mouth every 6 (six) hours as needed. 30 tablet 2   apixaban  (ELIQUIS ) 5 MG TABS tablet Take 1 tablet (5 mg total) by mouth 2 (two) times daily. 60 tablet 0   atorvastatin  (LIPITOR) 80 MG tablet Take 1 tablet (80 mg total) by mouth daily. 30 tablet 3   buPROPion  (WELLBUTRIN  SR) 150 MG 12 hr tablet TAKE 1 TABLET BY MOUTH DAILY FOR 3 DAYS, THEN INCREASE TO 1 TABLET BY MOUTH TWICE DAILY 180 tablet 1   dapagliflozin  propanediol (FARXIGA ) 10 MG TABS tablet Take 1 tablet (10 mg total) by mouth daily. 30 tablet 0   furosemide  (LASIX ) 20 MG tablet Take 1 tablet (20 mg total) by mouth daily. 30 tablet 0   metFORMIN (GLUCOPHAGE) 500 MG tablet Take 500 mg by mouth daily.     methimazole  (TAPAZOLE ) 5 MG tablet Take 1 tablet (5 mg total) by mouth daily. 90 tablet 0   metoprolol  succinate (TOPROL -XL) 50 MG 24 hr tablet Take 2 tablets (100mg ) by mouth in the morning and 1 tablet (50mg ) in the afternoon. 30 tablet 0   spironolactone  (ALDACTONE ) 25 MG tablet Take 0.5 tablets (12.5 mg total) by mouth daily. 30 tablet 0   No current facility-administered medications for this visit.    SURGICAL HISTORY:  Past Surgical History:  Procedure Laterality Date   CATARACT EXTRACTION W/PHACO Left 01/20/2024   Procedure: PHACOEMULSIFICATION, CATARACT, WITH IOL INSERTION 11.63 01:05.8;  Surgeon: Jaye Fallow, MD;  Location: [facility];  Service: Ophthalmology;  Laterality: Left;   colonoscopy     FOOT SURGERY Right    IR CT HEAD LTD  07/12/2023   IR PERCUTANEOUS ART THROMBECTOMY/INFUSION INTRACRANIAL INC DIAG ANGIO  07/12/2023   IR US  GUIDE VASC ACCESS RIGHT  07/12/2023   RADIOLOGY WITH ANESTHESIA N/A 07/12/2023   Procedure: RADIOLOGY WITH ANESTHESIA;  Surgeon: Radiologist, Medication, MD;  Location: [facility] OR;  Service: Radiology;   Laterality: N/A;   VIDEO BRONCHOSCOPY WITH ENDOBRONCHIAL NAVIGATION Left 01/06/2024   Procedure: VIDEO BRONCHOSCOPY WITH ENDOBRONCHIAL NAVIGATION;  Surgeon: Shelah Lamar RAMAN, MD;  Location: [facility] END[facility];  Service: Pulmonary;  Laterality: Left;   VIDEO BRONCHOSCOPY WITH ENDOBRONCHIAL ULTRASOUND  01/06/2024   Procedure: BRONCHOSCOPY, WITH EBUS;  Surgeon: Shelah Lamar RAMAN, MD;  Location: [facility] END[facility];  Service: Pulmonary;;    REVIEW OF SYSTEMS:   Review of Systems  Constitutional: Negative for appetite change, chills, fatigue, fever and unexpected weight change.  HENT: Negative for mouth sores, nosebleeds, sore throat and trouble swallowing.   Eyes: Negative for eye problems and icterus.  Respiratory: Stable cough. Negative for hemoptysis, shortness of breath and wheezing.   Cardiovascular: Negative for chest pain and leg swelling.  Gastrointestinal: Negative for abdominal pain, constipation, diarrhea, nausea and vomiting.  Genitourinary: Negative for bladder incontinence, difficulty urinating, dysuria, frequency and hematuria.   Musculoskeletal: Negative for back pain, gait problem, neck pain and neck  stiffness.  Skin: Negative for itching and rash.  Neurological: Positive for aphasia. Negative for dizziness, extremity weakness, gait problem, headaches, light-headedness and seizures.  Hematological: Negative for adenopathy. Does not bruise/bleed easily.  Psychiatric/Behavioral: Negative for confusion, depression and sleep disturbance. The patient is not nervous/anxious.     PHYSICAL EXAMINATION:  Blood pressure 104/81, pulse 80, temperature (!) 97 F (36.1 C), resp. rate 20, weight 275 lb 9.6 oz (125 kg), SpO2 96%.  ECOG PERFORMANCE STATUS: 1  Physical Exam  Constitutional: Oriented to person, place, and time and well-developed, well-nourished, and in no distress.  HENT:  Head: Normocephalic and atraumatic.  Mouth/Throat: Oropharynx is clear and moist. No oropharyngeal exudate.  Eyes:  Conjunctivae are normal. Right eye exhibits no discharge. Left eye exhibits no discharge. No scleral icterus.  Neck: Normal range of motion. Neck supple.  Cardiovascular: Normal rate, regular rhythm, normal heart sounds and intact distal pulses.   Pulmonary/Chest: Effort normal and breath sounds normal. No respiratory distress. No wheezes. No rales.  Abdominal: Soft. Bowel sounds are normal. Exhibits no distension and no mass. There is no tenderness.  Musculoskeletal: Normal range of motion. Exhibits no edema.  Lymphadenopathy:    No cervical adenopathy.  Neurological: Alert and oriented to person, place, and time. Exhibits normal muscle tone. Gait normal. Coordination normal.  Skin: Skin is warm and dry. No rash noted. Not diaphoretic. No erythema. No pallor.  Psychiatric: Mood, memory and judgment normal.  Vitals reviewed.  LABORATORY DATA: Lab Results  Component Value Date   WBC 13.7 (H) 01/26/2024   HGB 16.8 01/26/2024   HCT 51.3 01/26/2024   MCV 93.6 01/26/2024   PLT 186 01/26/2024      Chemistry      Component Value Date/Time   NA 141 01/26/2024 1401   K 4.4 01/26/2024 1401   CL 105 01/26/2024 1401   CO2 26 01/26/2024 1401   BUN 30 (H) 01/26/2024 1401   CREATININE 1.68 (H) 01/26/2024 1401      Component Value Date/Time   CALCIUM  9.5 01/26/2024 1401   ALKPHOS 112 01/26/2024 1401   AST 29 01/26/2024 1401   ALT 39 01/26/2024 1401   BILITOT 0.6 01/26/2024 1401       RADIOGRAPHIC STUDIES:  CT SUPER D CHEST WO CONTRAST Result Date: 01/07/2024 CLINICAL DATA:  Multiple lung nodules. EXAM: CT CHEST WITHOUT CONTRAST TECHNIQUE: Multidetector CT imaging of the chest was performed using thin slice collimation for electromagnetic bronchoscopy planning purposes, without intravenous contrast. RADIATION DOSE REDUCTION: This exam was performed according to the departmental dose-optimization program which includes automated exposure control, adjustment of the mA and/or kV according  to patient size and/or use of iterative reconstruction technique. COMPARISON:  11/13/2023 and PET 12/03/2023. FINDINGS: Cardiovascular: Atherosclerotic calcification of the aorta, aortic valve and coronary arteries. Enlarged pulmonic trunk and heart. No pericardial effusion. Mediastinum/Nodes: Left lateral prevascular lymph node measures 12 mm (2/39), unchanged and hypermetabolic on PET 12/03/2023. Otherwise, no pathologically enlarged mediastinal or axillary lymph nodes. Hilar regions are difficult to definitively evaluate without IV contrast. Air in the esophagus can be seen with dysmotility. Lungs/Pleura: Image quality is degraded by expiratory phase imaging. Centrilobular emphysema. New subpleural nodular consolidation in the inferolateral right lower lobe (3/67), likely infectious/inflammatory in etiology. 0.6 x 1.4 cm pleural nodule in the anterior upper left hemithorax (2/31), hypermetabolic on 12/03/2023. Left upper lobe parenchymal nodules measure up to 6 mm (3/26), unchanged. Pleural mass in the infero posterior left hemithorax measures 2.8 x 4.3 cm, stable from  11/13/2023 but new from 12/19/2014. Lesion was hypermetabolic on PET 12/03/2023. No pleural fluid. Minimal debris in the airway. Upper Abdomen: Visualized portions of the liver, gallbladder, adrenal glands, kidneys, spleen, pancreas, stomach and bowel are grossly unremarkable. No upper abdominal adenopathy. Musculoskeletal: Degenerative changes in the spine. IMPRESSION: 1. Left pleural nodules and masses with an enlarged lateral left pre-vascular lymph node, findings indicative of malignancy, either primary or metastatic in etiology. 2. Aortic atherosclerosis (ICD10-I70.0). Coronary artery calcification. 3. Enlarged pulmonic trunk, indicative of pulmonary arterial hypertension. Electronically Signed   By: Newell Eke M.D.   On: 01/07/2024 13:29   DG Chest Port 1 View Result Date: 01/06/2024 CLINICAL DATA:  Status post bronchoscopy and biopsy  EXAM: PORTABLE CHEST 1 VIEW COMPARISON:  01/01/2024, 07/12/2023 FINDINGS: Single frontal view of the chest demonstrates multi lead pacer/AICD overlying left chest. The cardiac silhouette is enlarged. Increased density in the retrocardiac region compatible with known pleural based mass. The other pleural and parenchymal nodules within the left upper lobe and left apex seen on prior CT are not as apparent by radiograph. No effusion or pneumothorax. No acute bony abnormalities. IMPRESSION: 1. No complication after bronchoscopy. 2. Left basilar pleural based mass again noted. The additional pleural and parenchymal nodularity within the left upper lobe noted on prior CT are not as apparent by x-ray. Electronically Signed   By: Ozell Daring M.D.   On: 01/06/2024 16:05   DG C-ARM BRONCHOSCOPY Result Date: 01/06/2024 C-ARM BRONCHOSCOPY: Fluoroscopy was utilized by the requesting physician.  No radiographic interpretation.   DG C-Arm 1-60 Min-No Report Result Date: 01/06/2024 Fluoroscopy was utilized by the requesting physician.  No radiographic interpretation.   DG C-Arm 1-60 Min-No Report Result Date: 01/06/2024 Fluoroscopy was utilized by the requesting physician.  No radiographic interpretation.     ASSESSMENT/PLAN:  This is a very pleasant 68 year old Caucasian male diagnosed with metastatic renal cell carcinoma.  He presented with a right renal mass in addition to left pleural-based lung mass and multiple smaller nodules and hypermetabolic activity in the right sacroiliac joint.  He was diagnosed in December 2025.  The patient was seen with Dr. Sherrod today.  Dr. Sherrod had a lengthy discussion with the patient about his current condition and treatment options.  Dr. Sherrod recommends immunotherapy with avelumab and ipilimumab IV every 3 weeks for 4 cycles followed by maintenance nivolumab every 4 weeks.  The patient is interested in this option and he is expected to undergo his first cycle of  treatment next week.  I will arrange for a chemo education class prior to his first cycle of treatment.  I discussed with her the adverse effect of the immunotherapy including but not limited to immunotherapy mediated skin rash, diarrhea, inflammation of the lung, kidney, liver, thyroid  or other endocrine dysfunction.  Per Dr. Sherrod okay for immunotherapy with his Yvone' disease as it is being managed.  I sent a prescription for Compazine  10 mg p.o. every 6 hours as needed to the patient's pharmacy.  I will arrange for Port-A-Cath placement.  I placed an order for Emla cream.  We will see him back for a follow-up visit in 4 weeks before undergoing cycle #2.   I have placed a referral to social work.  We will arrange for brain MRI to complete the staging workup.  The patient was advised to call immediately if he has any concerning symptoms in the interval. The patient voices understanding of current disease status and treatment options and is in agreement  with the current care plan. All questions were answered. The patient knows to call the clinic with any problems, questions or concerns. We can certainly see the patient much sooner if necessary   Orders Placed This Encounter  Procedures   MR Brain W Wo Contrast    Standing Status:   Future    Expected Date:   02/02/2024    Expiration Date:   01/25/2025    If indicated for the ordered procedure, I authorize the administration of contrast media per Radiology protocol:   Yes    What is the patient's sedation requirement?:   No Sedation    Does the patient have a pacemaker or implanted devices?:   No    Use SRS Protocol?:   No    Preferred imaging location?:   [facility] (table limit - 500lbs)   IR IMAGING GUIDED PORT INSERTION    Standing Status:   Future    Expiration Date:   01/25/2025    Reason for Exam (SYMPTOM  OR DIAGNOSIS REQUIRED):   Starting IV immunotherapy on ~02/04/23    Preferred Imaging Location?:   [facility]   CBC with Differential (Cancer Center Only)    Standing Status:   Future    Expected Date:   02/03/2024    Expiration Date:   02/02/2025   CMP (Cancer Center only)    Standing Status:   Future    Expected Date:   02/03/2024    Expiration Date:   02/02/2025   T4    Standing Status:   Future    Expected Date:   02/03/2024    Expiration Date:   02/02/2025   TSH    Standing Status:   Future    Expected Date:   02/03/2024    Expiration Date:   02/02/2025   CBC with Differential (Cancer Center Only)    Standing Status:   Future    Expected Date:   02/24/2024    Expiration Date:   02/23/2025   CMP (Cancer Center only)    Standing Status:   Future    Expected Date:   02/24/2024    Expiration Date:   02/23/2025   CBC with Differential (Cancer Center Only)    Standing Status:   Future    Expected Date:   03/16/2024    Expiration Date:   03/16/2025   CMP (Cancer Center only)    Standing Status:   Future    Expected Date:   03/16/2024    Expiration Date:   03/16/2025   T4    Standing Status:   Future    Expected Date:   03/16/2024    Expiration Date:   03/16/2025   TSH    Standing Status:   Future    Expected Date:   03/16/2024    Expiration Date:   03/16/2025   CBC with Differential (Cancer Center Only)    Standing Status:   Future    Expected Date:   04/06/2024    Expiration Date:   04/06/2025   CMP (Cancer Center only)    Standing Status:   Future    Expected Date:   04/06/2024    Expiration Date:   04/06/2025   CBC with Differential (Cancer Center Only)    Standing Status:   Future    Expected Date:   04/27/2024    Expiration Date:   04/27/2025   CMP (Cancer Center only)    Standing Status:   Future  Expected Date:   04/27/2024    Expiration Date:   04/27/2025   CBC with Differential (Cancer Center Only)    Standing Status:   Future    Expected Date:   05/25/2024    Expiration Date:   05/25/2025   CMP (Cancer Center only)    Standing Status:   Future    Expected Date:   05/25/2024     Expiration Date:   05/25/2025   T4    Standing Status:   Future    Expected Date:   05/25/2024    Expiration Date:   05/25/2025   TSH    Standing Status:   Future    Expected Date:   05/25/2024    Expiration Date:   05/25/2025   CBC with Differential (Cancer Center Only)    Standing Status:   Future    Expected Date:   06/22/2024    Expiration Date:   06/22/2025   CMP (Cancer Center only)    Standing Status:   Future    Expected Date:   06/22/2024    Expiration Date:   06/22/2025   CBC with Differential (Cancer Center Only)    Standing Status:   Future    Expected Date:   07/20/2024    Expiration Date:   07/20/2025   CMP (Cancer Center only)    Standing Status:   Future    Expected Date:   07/20/2024    Expiration Date:   07/20/2025   Ambulatory referral to Social Work    Referral Priority:   Routine    Referral Type:   Consultation    Referral Reason:   Specialty Services Required    Number of Visits Requested:   1      Hiro Vipond L Evelio Rueda, PA-C 01/26/2024  ADDENDUM: Hematology/Oncology Attending: I had a face-to-face encounter with the patient today.  I reviewed his record, lab, scan as well as the biopsy results and recommended his care plan.  This is a very pleasant 68 years old white male with stage IV (T2a, N0, M1) clear-cell renal cell carcinoma diagnosed in December 2025 and presented with large right renal mass in addition to metastatic disease to the lung. I had a lengthy discussion with the patient and his sister-in-law today about his current disease stage, prognosis and treatment options.  I explained to the patient that he has incurable condition and all the treatment will be of palliative nature with the goal of treatment is prolongation of his survival and palliation of his symptoms. He was getting the option of palliative systemic immunotherapy with ipilimumab 1 mg/KG and nivolumab 3 mg/KG every 3 weeks for 4 cycles followed by maintenance treatment with nivolumab  480 mg IV every 4 weeks up to 2 years unless the patient has evidence for disease progression or unacceptable toxicity.  He was also given the option of palliative care on hospice referral.  The patient is interested in treatment.  I discussed with him the adverse effect of the immunotherapy including but not limited to immunotherapy mediated skin rash, diarrhea, inflammation of the lung, kidney, liver, thyroid  or other endocrine dysfunction including type 1 diabetes mellitus. The patient is interested in the treatment and he is expected to start the first cycle of this treatment next week. He will come back for follow-up visit in 4 weeks for evaluation and management of any adverse effect of his treatment. The patient was advised to call immediately if he has any other concerning symptoms in the  interval. The total time spent in the appointment was 55 minutes including review of chart and various tests results, discussions about plan of care and coordination of care plan.  Disclaimer: This note was dictated with voice recognition software. Similar sounding words can inadvertently be transcribed and may be missed upon review. Sherrod MARLA Sherrod, MD

## 2024-01-25 ENCOUNTER — Other Ambulatory Visit: Payer: Self-pay | Admitting: Physician Assistant

## 2024-01-25 DIAGNOSIS — N2889 Other specified disorders of kidney and ureter: Secondary | ICD-10-CM

## 2024-01-26 ENCOUNTER — Ambulatory Visit (HOSPITAL_COMMUNITY)

## 2024-01-26 ENCOUNTER — Encounter (HOSPITAL_COMMUNITY): Payer: Self-pay

## 2024-01-26 ENCOUNTER — Inpatient Hospital Stay: Attending: Nurse Practitioner

## 2024-01-26 ENCOUNTER — Inpatient Hospital Stay: Admitting: Physician Assistant

## 2024-01-26 VITALS — BP 104/81 | HR 80 | Temp 97.0°F | Resp 20 | Wt 275.6 lb

## 2024-01-26 DIAGNOSIS — N2889 Other specified disorders of kidney and ureter: Secondary | ICD-10-CM

## 2024-01-26 DIAGNOSIS — Z87891 Personal history of nicotine dependence: Secondary | ICD-10-CM | POA: Diagnosis not present

## 2024-01-26 DIAGNOSIS — C641 Malignant neoplasm of right kidney, except renal pelvis: Secondary | ICD-10-CM | POA: Insufficient documentation

## 2024-01-26 DIAGNOSIS — C7802 Secondary malignant neoplasm of left lung: Secondary | ICD-10-CM | POA: Diagnosis not present

## 2024-01-26 DIAGNOSIS — Z95810 Presence of automatic (implantable) cardiac defibrillator: Secondary | ICD-10-CM | POA: Insufficient documentation

## 2024-01-26 DIAGNOSIS — Z86711 Personal history of pulmonary embolism: Secondary | ICD-10-CM | POA: Diagnosis not present

## 2024-01-26 LAB — CMP (CANCER CENTER ONLY)
ALT: 39 U/L (ref 0–44)
AST: 29 U/L (ref 15–41)
Albumin: 4.1 g/dL (ref 3.5–5.0)
Alkaline Phosphatase: 112 U/L (ref 38–126)
Anion gap: 10 (ref 5–15)
BUN: 30 mg/dL — ABNORMAL HIGH (ref 8–23)
CO2: 26 mmol/L (ref 22–32)
Calcium: 9.5 mg/dL (ref 8.9–10.3)
Chloride: 105 mmol/L (ref 98–111)
Creatinine: 1.68 mg/dL — ABNORMAL HIGH (ref 0.61–1.24)
GFR, Estimated: 45 mL/min — ABNORMAL LOW
Glucose, Bld: 160 mg/dL — ABNORMAL HIGH (ref 70–99)
Potassium: 4.4 mmol/L (ref 3.5–5.1)
Sodium: 141 mmol/L (ref 135–145)
Total Bilirubin: 0.6 mg/dL (ref 0.0–1.2)
Total Protein: 7.2 g/dL (ref 6.5–8.1)

## 2024-01-26 LAB — CBC WITH DIFFERENTIAL (CANCER CENTER ONLY)
Abs Immature Granulocytes: 0.06 K/uL (ref 0.00–0.07)
Basophils Absolute: 0.1 K/uL (ref 0.0–0.1)
Basophils Relative: 1 %
Eosinophils Absolute: 0.3 K/uL (ref 0.0–0.5)
Eosinophils Relative: 2 %
HCT: 51.3 % (ref 39.0–52.0)
Hemoglobin: 16.8 g/dL (ref 13.0–17.0)
Immature Granulocytes: 0 %
Lymphocytes Relative: 26 %
Lymphs Abs: 3.6 K/uL (ref 0.7–4.0)
MCH: 30.7 pg (ref 26.0–34.0)
MCHC: 32.7 g/dL (ref 30.0–36.0)
MCV: 93.6 fL (ref 80.0–100.0)
Monocytes Absolute: 1 K/uL (ref 0.1–1.0)
Monocytes Relative: 7 %
Neutro Abs: 8.7 K/uL — ABNORMAL HIGH (ref 1.7–7.7)
Neutrophils Relative %: 64 %
Platelet Count: 186 K/uL (ref 150–400)
RBC: 5.48 MIL/uL (ref 4.22–5.81)
RDW: 13.7 % (ref 11.5–15.5)
WBC Count: 13.7 K/uL — ABNORMAL HIGH (ref 4.0–10.5)
nRBC: 0 % (ref 0.0–0.2)

## 2024-01-26 MED ORDER — PROCHLORPERAZINE MALEATE 10 MG PO TABS: 10.0000 mg | ORAL_TABLET | Freq: Four times a day (QID) | ORAL | 2 refills | Status: AC | PRN

## 2024-01-26 MED ORDER — LIDOCAINE-PRILOCAINE 2.5-2.5 % EX CREA: 1.0000 | TOPICAL_CREAM | CUTANEOUS | 2 refills | Status: AC | PRN

## 2024-01-26 NOTE — Patient Instructions (Addendum)
 Summary:  -This is stage IV kidney cancer (Renal Cell Carcinoma) (because it spread to the lung).  -The treatment that you will receive consists of two immunotherapy drugs, Dr. Sherrod would recommend immunotherapy with nivolumab and ipi IV every 3 weeks for 4 times. After the 4th treatment, you only will be on Nivolumab IV every 4 weeks -We are planning on starting your treatment next week on 02/04/24 but before your start your treatment, I would like you to attend a Chemotherapy Education Class. This involves having you sit down with one of our nurse educators. She will discuss with your one-on-one more details about your treatment as well as general information about resources here at the cancer center.  -We will get a CT scan after 4 treatments to check on the progress of treatment I discussed with her the adverse effect of the immunotherapy including but not limited to immunotherapy mediated skin rash, diarrhea, inflammation of the lung, kidney, liver, thyroid  or other endocrine dysfunction   Medications:  -I have sent a few important medication prescriptions to your pharmacy.  -Compazine  was sent to your pharmacy. This medication is for nausea. You may take this every 6 hours as needed if you feel nauseous.  -EMLA cream    Referrals or Imaging: -Brain MRI -Port - Referral to social work   Follow up:  -We will see you back for a follow up visit 4 weeks before undergoing cycle #2.   -If you need to reach us  at any time, the main office number to the cancer center is [phone], when you call, ask to speak to either Cassie's or Dr. Jeannett nurse.

## 2024-01-26 NOTE — Progress Notes (Signed)
START ON PATHWAY REGIMEN - Renal Cell     Cycles 1 through 4: A cycle is every 21 days:     Nivolumab      Ipilimumab    Cycles 5 and beyond: A cycle is every 28 days:     Nivolumab   **Always confirm dose/schedule in your pharmacy ordering system**  Patient Characteristics: Stage IV (Unresected T4M0 or Any T, M1)/Metastatic Disease, Clear Cell, First Line, Intermediate or Poor Risk Therapeutic Status: Stage IV (Unresected T4M0 or Any T, M1)/Metastatic Disease Histology: Clear Cell Line of Therapy: First Line Risk Status: Intermediate Risk Intent of Therapy: Non-Curative / Palliative Intent, Discussed with Patient 

## 2024-01-27 ENCOUNTER — Other Ambulatory Visit: Payer: Self-pay

## 2024-01-27 ENCOUNTER — Inpatient Hospital Stay

## 2024-01-28 ENCOUNTER — Other Ambulatory Visit

## 2024-01-28 ENCOUNTER — Other Ambulatory Visit: Payer: Self-pay

## 2024-01-28 DIAGNOSIS — Z716 Tobacco abuse counseling: Secondary | ICD-10-CM

## 2024-01-28 NOTE — Progress Notes (Signed)
" ° °  01/28/2024 Name: Henry Schneider MRN: [id number] DOB: [birth year]  Chief Complaint  Patient presents with   Medication Management    Smoking Cessation    Henry Schneider is a 68 y.o. year old male who was referred for medication management by their primary care provider, Henry Philippe SAUNDERS, NP. They presented for a telephone today.   They were referred to the pharmacist by their PCP for assistance in managing smoking cessation    Subjective:  Care Team: Primary Care Provider: Billy Philippe SAUNDERS, NP    Medication Access/Adherence  Current Pharmacy:  [facility] [phone] [place], [place] - [address] AT [facility] [address] [place] [address] Phone: [phone] Fax: [phone]  [facility] [address] [place] [address] Phone: [phone] Fax: [phone]   Patient reports affordability concerns with their medications: No  Patient reports access/transportation concerns to their pharmacy: No  Patient reports adherence concerns with their medications:  No     Tobacco Abuse: Current Med Therapy: Bupropion  SR 150mg   Tobacco Use History: Been smoking for 40 years Number of cigarettes per day: 1 pack per day (reports use to be 2 packs) Smokes first cigarette within 30 minutes after waking Triggers include driving in car and being in the habit  Quit Attempt History: Longest time ever been tobacco free 6 months Methods tried in the past include stopping cold turkey, using nicotine  gum, and chantix. 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 Reports stress of recent stage IV cancer dx was the trigger   Objective:  Lab Results  Component Value Date   HGBA1C 6.8 (H) 01/05/2024    Lab Results  Component Value Date   CREATININE 1.68 (H) 01/26/2024    BUN 30 (H) 01/26/2024   NA 141 01/26/2024   K 4.4 01/26/2024   CL 105 01/26/2024   CO2 26 01/26/2024    Lab Results  Component Value Date   CHOL 153 01/05/2024   HDL 52 01/05/2024   LDLCALC 83 01/05/2024   TRIG 98 01/05/2024   CHOLHDL 2.9 01/05/2024    Medications Reviewed Today   Medications were not reviewed in this encounter       Assessment/Plan:   Tobacco Abuse - Currently uncontrolled, improving - Provided motivational interviewing to assess tobacco use and strategies for reduction - Provided information on 1 800 QUIT NOW support program - Continue Bupropion  SR 150mg   - Counseled patient on quit techniques and other support options, patient still prefers to try to stop all cigarettes completely instead of trying to taper down -Counseled on techniques to help curb cravings such as avoiding triggers and keeping mind busy  Follow Up Plan: 3 weeks  Jon VEAR Lindau, PharmD Clinical Pharmacist [phone]  "

## 2024-01-30 ENCOUNTER — Telehealth: Payer: Self-pay | Admitting: Physician Assistant

## 2024-01-30 ENCOUNTER — Other Ambulatory Visit: Payer: Self-pay

## 2024-01-30 ENCOUNTER — Other Ambulatory Visit: Payer: Self-pay | Admitting: Family Medicine

## 2024-01-30 DIAGNOSIS — E0500 Thyrotoxicosis with diffuse goiter without thyrotoxic crisis or storm: Secondary | ICD-10-CM

## 2024-01-30 NOTE — Telephone Encounter (Signed)
 Spoke to radiology. 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 I let the patient's daughter in law know this. I suspect that is why his MRI is schedule a few weeks out from now. I gave her the number to radiology scheduling though to see if they have any earlier availability but to specify this does need to be at [facility] MRI due to the pacemaker

## 2024-01-30 NOTE — Telephone Encounter (Signed)
 I received a message from interventional radiology scheduling today.  They are having a difficult time getting in touch with the patient to schedule his Port-A-Cath.  The patient completed paperwork that is okay to talk to his daughter-in-law and she was present during his most recent appointment.  I called her and asked that she give the number to radiology scheduling to the patient so that he may call and schedule his Port-A-Cath placement.  She also had questions about his MRI being in February.  I will reach out to radiology about his pacemaker to see if that is what is causing the MRI to be in February as opposed to January.  I will also check with Dr. Sherrod if this needs to be changed to a CT instead.

## 2024-02-02 ENCOUNTER — Encounter: Payer: Self-pay | Admitting: Internal Medicine

## 2024-02-02 ENCOUNTER — Telehealth: Payer: Self-pay

## 2024-02-02 NOTE — Telephone Encounter (Signed)
 Patient called and LVM stating he had some questions I regards to an MRI.  Spoke with patient and he was inquiring whether he could get the MRI scheduled sooner.  Informed him that Cassie, PA talked to his sister-in-law on Friday 01/30/24 and informed her of the number to centralized scheduled to call and see if MRI can be performed sooner.  He states that he has the number as well and will call.  He voiced understanding.

## 2024-02-02 NOTE — Progress Notes (Signed)
 Patient called after receiving my card per my request.  Introduced myself as Dance Movement Psychotherapist and to ask if he had any financial questions or concerns regarding his treatment. Patient has several questions regarding bills and balance and insurance. I explained to the best of my ability and advised the number on the bill would be the best contact regarding billing details.  Emailed MARLA Sharps to as if someone could sit down with patient and explain his bills and balances.   Patient will call me on Thurs 1/8 at 11am to discuss follow up. He has my card to do so and for any additional financial questions or concerns.

## 2024-02-03 ENCOUNTER — Inpatient Hospital Stay: Attending: Nurse Practitioner | Admitting: Licensed Clinical Social Worker

## 2024-02-03 ENCOUNTER — Ambulatory Visit: Admit: 2024-02-03 | Admitting: Ophthalmology

## 2024-02-03 DIAGNOSIS — N2889 Other specified disorders of kidney and ureter: Secondary | ICD-10-CM

## 2024-02-03 DIAGNOSIS — Z5112 Encounter for antineoplastic immunotherapy: Secondary | ICD-10-CM | POA: Insufficient documentation

## 2024-02-03 DIAGNOSIS — C641 Malignant neoplasm of right kidney, except renal pelvis: Secondary | ICD-10-CM | POA: Insufficient documentation

## 2024-02-03 DIAGNOSIS — Z8673 Personal history of transient ischemic attack (TIA), and cerebral infarction without residual deficits: Secondary | ICD-10-CM | POA: Insufficient documentation

## 2024-02-03 DIAGNOSIS — C7802 Secondary malignant neoplasm of left lung: Secondary | ICD-10-CM | POA: Insufficient documentation

## 2024-02-03 DIAGNOSIS — Z79620 Long term (current) use of immunosuppressive biologic: Secondary | ICD-10-CM | POA: Insufficient documentation

## 2024-02-03 DIAGNOSIS — Z95810 Presence of automatic (implantable) cardiac defibrillator: Secondary | ICD-10-CM | POA: Insufficient documentation

## 2024-02-03 DIAGNOSIS — Z86711 Personal history of pulmonary embolism: Secondary | ICD-10-CM | POA: Insufficient documentation

## 2024-02-03 DIAGNOSIS — Z87891 Personal history of nicotine dependence: Secondary | ICD-10-CM | POA: Insufficient documentation

## 2024-02-04 ENCOUNTER — Inpatient Hospital Stay: Admitting: Physician Assistant

## 2024-02-04 ENCOUNTER — Inpatient Hospital Stay

## 2024-02-04 VITALS — BP 101/80 | HR 67 | Temp 97.8°F | Resp 17 | Wt 273.8 lb

## 2024-02-04 DIAGNOSIS — Z5112 Encounter for antineoplastic immunotherapy: Secondary | ICD-10-CM | POA: Diagnosis present

## 2024-02-04 DIAGNOSIS — C641 Malignant neoplasm of right kidney, except renal pelvis: Secondary | ICD-10-CM | POA: Diagnosis present

## 2024-02-04 DIAGNOSIS — Z8673 Personal history of transient ischemic attack (TIA), and cerebral infarction without residual deficits: Secondary | ICD-10-CM | POA: Diagnosis not present

## 2024-02-04 DIAGNOSIS — C7802 Secondary malignant neoplasm of left lung: Secondary | ICD-10-CM | POA: Diagnosis not present

## 2024-02-04 DIAGNOSIS — Z87891 Personal history of nicotine dependence: Secondary | ICD-10-CM | POA: Diagnosis not present

## 2024-02-04 DIAGNOSIS — Z79620 Long term (current) use of immunosuppressive biologic: Secondary | ICD-10-CM | POA: Diagnosis not present

## 2024-02-04 DIAGNOSIS — Z86711 Personal history of pulmonary embolism: Secondary | ICD-10-CM | POA: Diagnosis not present

## 2024-02-04 DIAGNOSIS — Z95810 Presence of automatic (implantable) cardiac defibrillator: Secondary | ICD-10-CM | POA: Diagnosis not present

## 2024-02-04 LAB — CMP (CANCER CENTER ONLY)
ALT: 31 U/L (ref 0–44)
AST: 26 U/L (ref 15–41)
Albumin: 4 g/dL (ref 3.5–5.0)
Alkaline Phosphatase: 103 U/L (ref 38–126)
Anion gap: 8 (ref 5–15)
BUN: 28 mg/dL — ABNORMAL HIGH (ref 8–23)
CO2: 26 mmol/L (ref 22–32)
Calcium: 9.6 mg/dL (ref 8.9–10.3)
Chloride: 107 mmol/L (ref 98–111)
Creatinine: 1.49 mg/dL — ABNORMAL HIGH (ref 0.61–1.24)
GFR, Estimated: 51 mL/min — ABNORMAL LOW
Glucose, Bld: 128 mg/dL — ABNORMAL HIGH (ref 70–99)
Potassium: 4.8 mmol/L (ref 3.5–5.1)
Sodium: 141 mmol/L (ref 135–145)
Total Bilirubin: 1 mg/dL (ref 0.0–1.2)
Total Protein: 7.1 g/dL (ref 6.5–8.1)

## 2024-02-04 LAB — CBC WITH DIFFERENTIAL (CANCER CENTER ONLY)
Abs Immature Granulocytes: 0.03 K/uL (ref 0.00–0.07)
Basophils Absolute: 0.1 K/uL (ref 0.0–0.1)
Basophils Relative: 1 %
Eosinophils Absolute: 0.2 K/uL (ref 0.0–0.5)
Eosinophils Relative: 2 %
HCT: 52.9 % — ABNORMAL HIGH (ref 39.0–52.0)
Hemoglobin: 17.4 g/dL — ABNORMAL HIGH (ref 13.0–17.0)
Immature Granulocytes: 0 %
Lymphocytes Relative: 28 %
Lymphs Abs: 2.7 K/uL (ref 0.7–4.0)
MCH: 30.7 pg (ref 26.0–34.0)
MCHC: 32.9 g/dL (ref 30.0–36.0)
MCV: 93.5 fL (ref 80.0–100.0)
Monocytes Absolute: 0.8 K/uL (ref 0.1–1.0)
Monocytes Relative: 9 %
Neutro Abs: 5.8 K/uL (ref 1.7–7.7)
Neutrophils Relative %: 60 %
Platelet Count: 187 K/uL (ref 150–400)
RBC: 5.66 MIL/uL (ref 4.22–5.81)
RDW: 13.7 % (ref 11.5–15.5)
WBC Count: 9.6 K/uL (ref 4.0–10.5)
nRBC: 0 % (ref 0.0–0.2)

## 2024-02-04 LAB — TSH: TSH: 1.55 u[IU]/mL (ref 0.350–4.500)

## 2024-02-04 MED ORDER — DIPHENHYDRAMINE HCL 50 MG/ML IJ SOLN
25.0000 mg | Freq: Once | INTRAMUSCULAR | Status: AC
  Administered 2024-02-04: 25 mg via INTRAVENOUS
  Filled 2024-02-04: qty 1

## 2024-02-04 MED ORDER — FAMOTIDINE IN NACL 20-0.9 MG/50ML-% IV SOLN
20.0000 mg | Freq: Once | INTRAVENOUS | Status: AC
  Administered 2024-02-04: 20 mg via INTRAVENOUS
  Filled 2024-02-04: qty 50

## 2024-02-04 MED ORDER — SODIUM CHLORIDE 0.9 % IV SOLN
3.2000 mg/kg | Freq: Once | INTRAVENOUS | Status: AC
  Administered 2024-02-04: 400 mg via INTRAVENOUS
  Filled 2024-02-04: qty 24

## 2024-02-04 MED ORDER — SODIUM CHLORIDE 0.9 % IV SOLN
1.0000 mg/kg | Freq: Once | INTRAVENOUS | Status: AC
  Administered 2024-02-04: 125 mg via INTRAVENOUS
  Filled 2024-02-04: qty 25

## 2024-02-04 MED ORDER — SODIUM CHLORIDE 0.9 % IV SOLN: INTRAVENOUS | Status: DC

## 2024-02-04 NOTE — Progress Notes (Signed)
 [facility] Clinical Social Work  Initial Assessment   Henry Schneider is a 69 y.o. year old male contacted by phone. Clinical Social Work was referred by medical provider for assessment of psychosocial needs.   SDOH (Social Determinants of Health) assessments performed: Yes SDOH Interventions    Flowsheet Row Office Visit from 10/28/2023 in [facility] [place]  SDOH Interventions   Food Insecurity Interventions Intervention Not Indicated  Housing Interventions Intervention Not Indicated  Transportation Interventions Intervention Not Indicated  Utilities Interventions Intervention Not Indicated  Alcohol Usage Interventions Intervention Not Indicated (Score <7)  Financial Strain Interventions Intervention Not Indicated  Physical Activity Interventions Intervention Not Indicated  Stress Interventions Intervention Not Indicated  Social Connections Interventions Intervention Not Indicated  Health Literacy Interventions Intervention Not Indicated    SDOH Screenings   Food Insecurity: No Food Insecurity (10/28/2023)  Housing: Low Risk (10/28/2023)  Transportation Needs: No Transportation Needs (10/28/2023)  Utilities: Not At Risk (10/28/2023)  Alcohol Screen: Low Risk (10/28/2023)  Depression (PHQ2-9): Low Risk (02/04/2024)  Financial Resource Strain: Low Risk (10/28/2023)  Physical Activity: Sufficiently Active (10/28/2023)  Social Connections: Socially Isolated (10/28/2023)  Stress: No Stress Concern Present (10/28/2023)  Tobacco Use: Medium Risk (01/23/2024)  Health Literacy: Adequate Health Literacy (10/28/2023)    PHQ 2/9:    02/04/2024    8:38 AM 12/24/2023   10:00 AM 11/11/2023    9:42 AM  Depression screen PHQ 2/9  Decreased Interest 0 0 0  Down, Depressed, Hopeless 0 0 0  PHQ - 2 Score 0 0 0  Altered sleeping   0  Tired, decreased energy   0  Change in appetite   0  Feeling bad or failure about yourself    0  Trouble concentrating   0  Moving slowly or  fidgety/restless   0  Suicidal thoughts   0  PHQ-9 Score   0   Difficult doing work/chores   Not difficult at all     Data saved with a previous flowsheet row definition     Distress Screen completed: Yes    01/27/2024    4:06 PM  ONCBCN DISTRESS SCREENING  Screening Type Initial Screening  How much distress have you been experiencing in the past week? (0-10) 2  Practical concerns type Finances  Physical Concerns Type  Tobacco use      Family/Social Information:  Housing Arrangement: patient lives alone.  Pt w/ some difficulty speaking as a result of a previous stroke, but is independent in ADLs otherwise.  Pt values his independence. Family members/support persons in your life? Pt has 2 brothers who reside out of state.  Pt's brother Velinda and sister-in-law Jon reside in [place] and have been pt's primary source of support and will try to be on conference phone for pt's appointments.  Transportation concerns: no  Employment: Retired music therapist.  Income source: Actor concerns: Not presently, but pt is on a fixed income and does not have available funds for additional unexpected expenses. Type of concern: None Food access concerns: no Religious or spiritual practice: Not known Advanced directives: No Services Currently in place:  none  Coping/ Adjustment to diagnosis: Patient understands treatment plan and what happens next? yes Concerns about diagnosis and/or treatment: How will I care for myself and Quality of life Patient reported stressors: Adjusting to my illness Hopes and/or priorities: pt's priority is to start treatment w/ the hope of positive results Patient enjoys being outside Current coping skills/ strengths: Capable of  independent living , Motivation for treatment/growth , and Physical Health     SUMMARY: Current SDOH Barriers:  Limited social support  Clinical Social Work Clinical Goal(s):  No clinical social work goals at this  time  Interventions: Discussed common feeling and emotions when being diagnosed with cancer, and the importance of support during treatment Informed patient of the support team roles and support services at [facility] Provided CSW contact information and encouraged patient to call with any questions or concerns Pt requested CSW speak w/ his sister-in-law regarding resources available.  Pt reports he rarely answers his phone as he has difficulty due to aphasia and prefers, if possible, that communication go through his sister-in-law.  CSW encouraged pt's sister-in-law to apply for Medicaid and SNAP benefits w/ pt as he may need help in the home eventually which could be covered by Medicaid if he qualifies.  CSW informed pt's sister-in-law of the [employer] as well as the need to be approved for SNAP benefits to qualify.  Pt referred to transportation should he need assistance while going through treatment, at this time pt will try to drive himself to his appts.     Follow Up Plan: Patient will contact CSW with any support or resource needs Patient verbalizes understanding of plan: Yes    Devere JONELLE Manna, LCSW Clinical Social Worker [facility]

## 2024-02-04 NOTE — Patient Instructions (Signed)
 [facility] - A DEPT OF [place]  Discharge Instructions: Thank you for choosing [facility] to provide your oncology and hematology care.   If you have a lab appointment with the Cancer Center, please go directly to the Cancer Center and check in at the registration area.   Wear comfortable clothing and clothing appropriate for easy access to any Portacath or PICC line.   We strive to give you quality time with your provider. You may need to reschedule your appointment if you arrive late (15 or more minutes).  Arriving late affects you and other patients whose appointments are after yours.  Also, if you miss three or more appointments without notifying the office, you may be dismissed from the clinic at the providers discretion.      For prescription refill requests, have your pharmacy contact our office and allow 72 hours for refills to be completed.    Today you received the following chemotherapy and/or immunotherapy agents Opdivo /Yervoy       To help prevent nausea and vomiting after your treatment, we encourage you to take your nausea medication as directed.  BELOW ARE SYMPTOMS THAT SHOULD BE REPORTED IMMEDIATELY: *FEVER GREATER THAN 100.4 F (38 C) OR HIGHER *CHILLS OR SWEATING *NAUSEA AND VOMITING THAT IS NOT CONTROLLED WITH YOUR NAUSEA MEDICATION *UNUSUAL SHORTNESS OF BREATH *UNUSUAL BRUISING OR BLEEDING *URINARY PROBLEMS (pain or burning when urinating, or frequent urination) *BOWEL PROBLEMS (unusual diarrhea, constipation, pain near the anus) TENDERNESS IN MOUTH AND THROAT WITH OR WITHOUT PRESENCE OF ULCERS (sore throat, sores in mouth, or a toothache) UNUSUAL RASH, SWELLING OR PAIN  UNUSUAL VAGINAL DISCHARGE OR ITCHING   Items with * indicate a potential emergency and should be followed up as soon as possible or go to the Emergency Department if any problems should occur.  Please show the CHEMOTHERAPY ALERT CARD or  IMMUNOTHERAPY ALERT CARD at check-in to the Emergency Department and triage nurse.  Should you have questions after your visit or need to cancel or reschedule your appointment, please contact [facility] - A DEPT OF [facility]  Dept: [phone]  and follow the prompts.  Office hours are 8:00 a.m. to 4:30 p.m. Monday - Friday. Please note that voicemails left after 4:00 p.m. may not be returned until the following business day.  We are closed weekends and major holidays. You have access to a nurse at all times for urgent questions. Please call the main number to the clinic Dept: [phone] and follow the prompts.   For any non-urgent questions, you may also contact your provider using MyChart. We now offer e-Visits for anyone 18 and older to request care online for non-urgent symptoms. For details visit [email or web address].   Also download the MyChart app! Go to the app store, search MyChart, open the app, select [facility], and log in with your MyChart username and password.  Nivolumab  Injection What is this medication? NIVOLUMAB  (nye VOL ue mab) treats some types of cancer. It works by helping your immune system slow or stop the spread of cancer cells. It is a monoclonal antibody. This medicine may be used for other purposes; ask your health care provider or pharmacist if you have questions. COMMON BRAND NAME(S): Opdivo  What should I tell my care team before I take this medication? They need to know if you have any of these conditions: Allogeneic stem cell transplant (uses someone else's stem cells) Autoimmune  diseases, such as Crohn disease, ulcerative colitis, lupus History of chest radiation Nervous system problems, such as Guillain-Barre syndrome or myasthenia gravis Organ transplant An unusual or allergic reaction to nivolumab , other medications, foods, dyes, or preservatives Pregnant or trying to get pregnant Breast-feeding How should I use  this medication? This medication is infused into a vein. It is given in a hospital or clinic setting. A special MedGuide will be given to you before each treatment. Be sure to read this information carefully each time. Talk to your care team about the use of this medication in children. While it may be prescribed for children as young as 12 years for selected conditions, precautions do apply. Overdosage: If you think you have taken too much of this medicine contact a poison control center or emergency room at once. NOTE: This medicine is only for you. Do not share this medicine with others. What if I miss a dose? Keep appointments for follow-up doses. It is important not to miss your dose. Call your care team if you are unable to keep an appointment. What may interact with this medication? Interactions have not been studied. This list may not describe all possible interactions. Give your health care provider a list of all the medicines, herbs, non-prescription drugs, or dietary supplements you use. Also tell them if you smoke, drink alcohol, or use illegal drugs. Some items may interact with your medicine. What should I watch for while using this medication? Your condition will be monitored carefully while you are receiving this medication. You may need blood work while taking this medication. This medication may cause serious skin reactions. They can happen weeks to months after starting the medication. Contact your care team right away if you notice fevers or flu-like symptoms with a rash. The rash may be red or purple and then turn into blisters or peeling of the skin. You may also notice a red rash with swelling of the face, lips, or lymph nodes in your neck or under your arms. Tell your care team right away if you have any change in your eyesight. Talk to your care team if you are pregnant or think you might be pregnant. A negative pregnancy test is required before starting this medication. A  reliable form of contraception is recommended while taking this medication and for 5 months after the last dose. Talk to your care team about effective forms of contraception. Do not breast-feed while taking this medication and for 5 months after the last dose. What side effects may I notice from receiving this medication? Side effects that you should report to your care team as soon as possible: Allergic reactions--skin rash, itching, hives, swelling of the face, lips, tongue, or throat Dry cough, shortness of breath or trouble breathing Eye pain, redness, irritation, or discharge with blurry or decreased vision Heart muscle inflammation--unusual weakness or fatigue, shortness of breath, chest pain, fast or irregular heartbeat, dizziness, swelling of the ankles, feet, or hands Hormone gland problems--headache, sensitivity to light, unusual weakness or fatigue, dizziness, fast or irregular heartbeat, increased sensitivity to cold or heat, excessive sweating, constipation, hair loss, increased thirst or amount of urine, tremors or shaking, irritability Infusion reactions--chest pain, shortness of breath or trouble breathing, feeling faint or lightheaded Kidney injury (glomerulonephritis)--decrease in the amount of urine, red or dark brown urine, foamy or bubbly urine, swelling of the ankles, hands, or feet Liver injury--right upper belly pain, loss of appetite, nausea, light-colored stool, dark yellow or brown urine, yellowing skin or  eyes, unusual weakness or fatigue Pain, tingling, or numbness in the hands or feet, muscle weakness, change in vision, confusion or trouble speaking, loss of balance or coordination, trouble walking, seizures Rash, fever, and swollen lymph nodes Redness, blistering, peeling, or loosening of the skin, including inside the mouth Sudden or severe stomach pain, bloody diarrhea, fever, nausea, vomiting Side effects that usually do not require medical attention (report these  to your care team if they continue or are bothersome): Bone, joint, or muscle pain Diarrhea Fatigue Loss of appetite Nausea Skin rash This list may not describe all possible side effects. Call your doctor for medical advice about side effects. You may report side effects to FDA at 1-800-FDA-1088. Where should I keep my medication? This medication is given in a hospital or clinic. It will not be stored at home. NOTE: This sheet is a summary. It may not cover all possible information. If you have questions about this medicine, talk to your doctor, pharmacist, or health care provider.  2024 Elsevier/Gold Standard (2021-05-14 00:00:00)  Ipilimumab  Injection What is this medication? IPILIMUMAB  (IP i LIM ue mab) treats some types of cancer. It works by helping your immune system slow or stop the spread of cancer cells. It is a monoclonal antibody. This medicine may be used for other purposes; ask your health care provider or pharmacist if you have questions. COMMON BRAND NAME(S): YERVOY  What should I tell my care team before I take this medication? They need to know if you have any of these conditions: Allogeneic stem cell transplant (uses someone else's stem cells) Autoimmune diseases, such as Crohn disease, ulcerative colitis, lupus Nervous system problems, such as Guillain-Barre syndrome or myasthenia gravis Organ transplant An unusual or allergic reaction to ipilimumab , other medications, foods, dyes, or preservatives Pregnant or trying to get pregnant Breast-feeding How should I use this medication? This medication is infused into a vein. It is given by your care team in a hospital or clinic setting. A special MedGuide will be given to you before each treatment. Be sure to read this information carefully each time. Talk to your care team about the use of this medication in children. While it may be prescribed for children as young as 12 years for selected conditions, precautions do  apply. Overdosage: If you think you have taken too much of this medicine contact a poison control center or emergency room at once. NOTE: This medicine is only for you. Do not share this medicine with others. What if I miss a dose? Keep appointments for follow-up doses. It is important not to miss your dose. Call your care team if you are unable to keep an appointment. What may interact with this medication? Interactions are not expected. This list may not describe all possible interactions. Give your health care provider a list of all the medicines, herbs, non-prescription drugs, or dietary supplements you use. Also tell them if you smoke, drink alcohol, or use illegal drugs. Some items may interact with your medicine. What should I watch for while using this medication? Your condition will be monitored carefully while you are receiving this medication. You may need blood work while taking this medication. This medication may cause serious skin reactions. They can happen weeks to months after starting the medication. Contact your care team right away if you notice fevers or flu-like symptoms with a rash. The rash may be red or purple and then turn into blisters or peeling of the skin. You may also notice a red rash  with swelling of the face, lips, or lymph nodes in your neck or under your arms. Tell your care team right away if you have any change in your eyesight. Talk to your care team if you may be pregnant. Serious birth defects can occur if you take this medication during pregnancy and for 3 months after the last dose. You will need a negative pregnancy test before starting this medication. Contraception is recommended while taking this medication and for 3 months after the last dose. Your care team can help you find the option that works for you. Do not breastfeed while taking this medication and for 3 months after the last dose. What side effects may I notice from receiving this  medication? Side effects that you should report to your care team as soon as possible: Allergic reactions--skin rash, itching, hives, swelling of the face, lips, tongue, or throat Dry cough, shortness of breath or trouble breathing Eye pain, redness, irritation, or discharge with blurry or decreased vision Heart muscle inflammation--unusual weakness or fatigue, shortness of breath, chest pain, fast or irregular heartbeat, dizziness, swelling of the ankles, feet, or hands Hormone gland problems--headache, sensitivity to light, unusual weakness or fatigue, dizziness, fast or irregular heartbeat, increased sensitivity to cold or heat, excessive sweating, constipation, hair loss, increased thirst or amount of urine, tremors or shaking, irritability Infusion reactions--chest pain, shortness of breath or trouble breathing, feeling faint or lightheaded Kidney injury (glomerulonephritis)--decrease in the amount of urine, red or dark brown urine, foamy or bubbly urine, swelling of the ankles, hands, or feet Liver injury--right upper belly pain, loss of appetite, nausea, light-colored stool, dark yellow or brown urine, yellowing skin or eyes, unusual weakness or fatigue Pain, tingling, or numbness in the hands or feet, muscle weakness, change in vision, confusion or trouble speaking, loss of balance or coordination, trouble walking, seizures Rash, fever, and swollen lymph nodes Redness, blistering, peeling, or loosening of the skin, including inside the mouth Sudden or severe stomach pain, bloody diarrhea, fever, nausea, vomiting Side effects that usually do not require medical attention (report to your care team if they continue or are bothersome): Bone, joint, or muscle pain Diarrhea Fatigue Loss of appetite Nausea Skin rash This list may not describe all possible side effects. Call your doctor for medical advice about side effects. You may report side effects to FDA at 1-800-FDA-1088. Where should I  keep my medication? This medication is given in a hospital or clinic. It will not be stored at home. NOTE: This sheet is a summary. It may not cover all possible information. If you have questions about this medicine, talk to your doctor, pharmacist, or health care provider.  2024 Elsevier/Gold Standard (2021-06-01 00:00:00)

## 2024-02-04 NOTE — Progress Notes (Signed)
Ipilimumab (YERVOY) Patient Monitoring Assessment  ? ?Is the patient experiencing any of the following general symptoms?:  ?[] Difficulty performing normal activities ?[] Feeling sluggish or cold all the time ?[] Unusual weight gain ?[] Constant or unusual headaches ?[] Feeling dizzy or faint ?[] Changes in eyesight (blurry vision, double vision, or other vision problems) ?[] Changes in mood or behavior (ex: decreased sex drive, irritability, or forgetfulness) ?[] Starting new medications (ex: steroids, other medications that lower immune response) ?[x] Patient is not experiencing any of the general symptoms above.  ? ? ?Gastrointestinal  ?Patient is having 1 bowel movements each day.  ?Is this different from baseline? [] Yes [x] No ?Are your stools watery or do they have a foul smell? [] Yes [x] No ?Have you seen blood in your stools? [] Yes [x] No ?Are your stools dark, tarry, or sticky? [] Yes [x] No ?Are you having pain or tenderness in your belly? [] Yes [x] No ? ?Skin ?Does your skin itch? [] Yes [x] No ?Do you have a rash? [] Yes [x] No ?Has your skin blistered and/or peeled? [] Yes [x] No ?Do you have sores in your mouth? [] Yes [x] No ? ?Hepatic ?Has your urine been dark or tea colored? [] Yes [x] No ?Have you noticed that your skin or the whites of your eyes are turning yellow? [] Yes [x] No ?Are you bleeding or bruising more easily than normal? [] Yes [x] No ?Are you nauseous and/or vomiting? [] Yes [x] No ?Do you have pain on the right side of your stomach? [] Yes [x] No ? ?Neurologic  ?Are you having unusual weakness of legs, arms, or face? [] Yes [x] No ?Are you having numbness or tingling in your hands or feet? [] Yes [x] No ? ?Henry Schneider  ?

## 2024-02-05 ENCOUNTER — Encounter: Payer: Self-pay | Admitting: Internal Medicine

## 2024-02-05 ENCOUNTER — Telehealth: Payer: Self-pay

## 2024-02-05 LAB — T4: T4, Total: 6.3 ug/dL (ref 4.5–12.0)

## 2024-02-05 NOTE — Telephone Encounter (Deleted)
-----   Message from Nurse Rudell CROME, RN sent at 02/04/2024 11:34 AM EST ----- Regarding: Henry Schneider 1st Tx F/U call - Opdivo /Yervoy  Henry Schneider 1st Tx F/U call - Opdivo /Yervoy .  Tolerated infusion well.

## 2024-02-05 NOTE — Progress Notes (Signed)
 Received message from K.Smith following up on patient's billing status.  Called patient to remind of our previous conversation and provided follow up information. He was very adult nurse.  He has my card for any additional financial questions or concerns.

## 2024-02-05 NOTE — Telephone Encounter (Signed)
 ENCOUNTER OPENED IN ERROR

## 2024-02-06 ENCOUNTER — Other Ambulatory Visit: Payer: Self-pay | Admitting: Radiology

## 2024-02-06 ENCOUNTER — Ambulatory Visit: Payer: Self-pay | Admitting: *Deleted

## 2024-02-06 NOTE — H&P (Signed)
 "  Chief Complaint: Metastatic renal cell carcinoma presented with right renal mass in addition to left pleural-based lung mass and multiple smaller nodular lesion in addition to metabolic activity in the right sacroiliac joint. Diagnosed in December 2025; referred for port a cath placement to assist with treatment  Referring Provider(s): Mohamed,M  Supervising Physician: Hughes Simmonds  Patient Status: [facility] - Out-pt  History of Present Illness: Henry Schneider is a 69 y.o. 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  He is scheduled today for Port-A-Cath placement to assist with treatment.  *** Patient is Full Code  Past Medical History:  Diagnosis Date   A-fib (HCC)    Acute ischemic left middle cerebral artery (MCA) stroke (HCC) 07/15/2023   aphasia   Aphasia due to acute cerebrovascular accident (CVA) (HCC)    Buerger's disease    Cardiac LV ejection fraction 21-30% 07/12/2023   June 2020, EF 25-30%. while in 2016, EF was only 15%   Chronic combined systolic and diastolic CHF (congestive heart failure) (HCC)    Congestive heart failure (CHF) (HCC)    Diabetes mellitus without complication (HCC)    type 2   Dyspnea    with exertion   Emphysema of lung (HCC)    Grade I diastolic dysfunction    Graves disease    Heart disease    Hyperlipidemia    Hypertension    ICD (implantable cardioverter-defibrillator) in place, battery check 09-15-23    s/p [facility] Scientific CRT-D placement   LBBB (left bundle branch block)    Memory loss, short term    Non-ischemic cardiomyopathy (HCC)    NSVT (nonsustained ventricular tachycardia)  (HCC)    NYHA Class III cardiovascular function (HCC)    Obesity (BMI 30-39.9)    Peripheral vascular disease    Pulmonary embolism (HCC)    History of pulmonary emboli while on Eliquis ; A-fib   Stroke (HCC)    Syncope, cardiogenic    Systolic congestive heart failure with reduced left ventricular function, NYHA class 3 (HCC)    Thyroid  disease    Ventricular tachycardia [facility])     Past Surgical History:  Procedure Laterality Date   CATARACT EXTRACTION W/PHACO Left 01/20/2024   Procedure: PHACOEMULSIFICATION, CATARACT, WITH IOL INSERTION 11.63 01:05.8;  Surgeon: Jaye Fallow, MD;  Location: [facility];  Service: Ophthalmology;  Laterality: Left;   colonoscopy     FOOT SURGERY Right    IR CT HEAD LTD  07/12/2023   IR PERCUTANEOUS ART THROMBECTOMY/INFUSION INTRACRANIAL INC DIAG ANGIO  07/12/2023   IR US  GUIDE VASC ACCESS RIGHT  07/12/2023   RADIOLOGY WITH ANESTHESIA N/A 07/12/2023   Procedure: RADIOLOGY WITH ANESTHESIA;  Surgeon: Radiologist, Medication, MD;  Location: [facility] OR;  Service: Radiology;  Laterality: N/A;   VIDEO BRONCHOSCOPY WITH ENDOBRONCHIAL NAVIGATION Left 01/06/2024   Procedure: VIDEO BRONCHOSCOPY WITH ENDOBRONCHIAL NAVIGATION;  Surgeon: Shelah Lamar RAMAN, MD;  Location: [facility] ENDOSCOPY;  Service: Pulmonary;  Laterality: Left;   VIDEO BRONCHOSCOPY WITH ENDOBRONCHIAL ULTRASOUND  01/06/2024   Procedure: BRONCHOSCOPY, WITH EBUS;  Surgeon: Shelah Lamar RAMAN, MD;  Location: [facility] ENDOSCOPY;  Service: Pulmonary;;    Allergies: Patient has no known allergies.  Medications: Prior to Admission medications  Medication Sig Start Date  End Date Taking? Authorizing Provider  apixaban  (ELIQUIS ) 5 MG TABS tablet Take 1 tablet (5 mg total) by mouth 2 (two) times daily. 07/18/23   Jerilynn Daphne SAILOR, NP  atorvastatin  (LIPITOR) 80 MG tablet Take 1 tablet (80 mg total) by mouth daily. 01/08/24   Rosemarie Eather RAMAN, MD  buPROPion  (WELLBUTRIN  SR) 150 MG 12 hr tablet TAKE 1 TABLET BY MOUTH  DAILY FOR 3 DAYS, THEN INCREASE TO 1 TABLET BY MOUTH TWICE DAILY 12/28/23   Williamson, Joanna R, NP  dapagliflozin  propanediol (FARXIGA ) 10 MG TABS tablet Take 1 tablet (10 mg total) by mouth daily. 07/18/23   Jerilynn Daphne SAILOR, NP  furosemide  (LASIX ) 20 MG tablet Take 1 tablet (20 mg total) by mouth daily. 07/18/23   Jerilynn Daphne SAILOR, NP  lidocaine -prilocaine  (EMLA ) cream Apply 1 Application topically as needed. 01/26/24   Heilingoetter, Cassandra L, PA-C  metFORMIN (GLUCOPHAGE) 500 MG tablet Take 500 mg by mouth daily. 12/27/23   [provider]  methimazole  (TAPAZOLE ) 5 MG tablet TAKE 1 TABLET (5 MG TOTAL) BY MOUTH DAILY. 01/30/24   Williamson, Joanna R, NP  metoprolol  succinate (TOPROL -XL) 50 MG 24 hr tablet Take 2 tablets (100mg ) by mouth in the morning and 1 tablet (50mg ) in the afternoon. 07/18/23   Jerilynn Daphne SAILOR, NP  prochlorperazine  (COMPAZINE ) 10 MG tablet Take 1 tablet (10 mg total) by mouth every 6 (six) hours as needed. 01/26/24   Heilingoetter, Cassandra L, PA-C  spironolactone  (ALDACTONE ) 25 MG tablet Take 0.5 tablets (12.5 mg total) by mouth daily. 07/18/23   Jerilynn Daphne SAILOR, NP     Family History  Problem Relation Age of Onset   Coronary artery disease Mother    Coronary artery disease Father    Cancer Brother     Social History   Socioeconomic History   Marital status: Single    Spouse name: Not on file   Number of children: 0   Years of education: Not on file   Highest education level: High school graduate  Occupational History   Not on file  Tobacco Use   Smoking status: Former    Current packs/day: 0.25    Average packs/day: 0.3 packs/day for 0.1 years    Types: Cigarettes    Start date: 01/14/2024    Quit date: 12/12/2023   Smokeless tobacco: Never  Vaping Use   Vaping status: Never Used  Substance and Sexual Activity   Alcohol use: Yes    Comment: occasional beer   Drug use: Yes    Types: Marijuana    Comment: when younger   Sexual  activity: Not Currently  Other Topics Concern   Not on file  Social History Narrative   Not on file   Social Drivers of Health   Tobacco Use: Medium Risk (01/23/2024)   Patient History    Smoking Tobacco Use: Former    Smokeless Tobacco Use: Never    Passive Exposure: Not on Actuary Strain: Low Risk (10/28/2023)   Overall Financial Resource Strain (CARDIA)    Difficulty of Paying Living Expenses: Not hard at all  Food Insecurity: No Food Insecurity (10/28/2023)   Epic    Worried About Programme Researcher, Broadcasting/film/video in the Last Year: Never true    Ran Out of Food in the Last Year: Never true  Transportation Needs: No Transportation Needs (10/28/2023)   Epic    Lack of Transportation (Medical): No    Lack of Transportation (Non-Medical): No  Physical Activity: Sufficiently  Active (10/28/2023)   Exercise Vital Sign    Days of Exercise per Week: 6 days    Minutes of Exercise per Session: 50 min  Stress: No Stress Concern Present (10/28/2023)   [employer] of Occupational Health - Occupational Stress Questionnaire    Feeling of Stress: Not at all  Social Connections: Socially Isolated (10/28/2023)   Social Connection and Isolation Panel    Frequency of Communication with Friends and Family: Never    Frequency of Social Gatherings with Friends and Family: Never    Attends Religious Services: Never    Database Administrator or Organizations: Yes    Attends Banker Meetings: Never    Marital Status: Never married  Depression (PHQ2-9): Low Risk (02/04/2024)   Depression (PHQ2-9)    PHQ-2 Score: 0  Alcohol Screen: Low Risk (10/28/2023)   Alcohol Screen    Last Alcohol Screening Score (AUDIT): 1  Housing: Low Risk (10/28/2023)   Epic    Unable to Pay for Housing in the Last Year: No    Number of Times Moved in the Last Year: 1    Homeless in the Last Year: No  Utilities: Not At Risk (10/28/2023)   Epic    Threatened with loss of utilities: No  Health Literacy:  Adequate Health Literacy (10/28/2023)   B1300 Health Literacy    Frequency of need for help with medical instructions: Never       Review of Systems  Vital Signs:   Advance Care Plan: No documents on file  Physical Exam  Imaging: No results found.  Labs:  CBC: Recent Labs    11/11/23 1024 12/11/23 1558 01/26/24 1401 02/04/24 0742  WBC 11.8* 12.7* 13.7* 9.6  HGB 17.1* 17.2* 16.8 17.4*  HCT 52.6* 52.6* 51.3 52.9*  PLT 177.0 172 186 187    COAGS: Recent Labs    07/11/23 2240  INR 1.1  APTT 27    BMP: Recent Labs    07/17/23 0459 10/28/23 0914 12/11/23 1558 01/26/24 1401 02/04/24 0742  NA 139 141 141 141 141  K 4.4 4.3 5.0 4.4 4.8  CL 111 107 104 105 107  CO2 25 26 28 26 26   GLUCOSE 98 107* 98 160* 128*  BUN 13 24* 26* 30* 28*  CALCIUM  8.8* 9.8 9.8 9.5 9.6  CREATININE 1.12 1.10 1.12 1.68* 1.49*  GFRNONAA >60  --  >60 45* 51*    LIVER FUNCTION TESTS: Recent Labs    10/28/23 0914 12/11/23 1558 01/26/24 1401 02/04/24 0742  BILITOT 1.2 0.7 0.6 1.0  AST 15 33 29 26  ALT 21 38 39 31  ALKPHOS 87 89 112 103  PROT 6.8 7.0 7.2 7.1  ALBUMIN 4.1 3.9 4.1 4.0    TUMOR MARKERS: No results for input(s): AFPTM, CEA, CA199, CHROMGRNA in the last 8760 hours.  Assessment and Plan: 69 y.o. 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  He is scheduled today for Port-A-Cath placement to assist with treatment.Risks and benefits of image guided port-a-catheter placement was discussed with the patient including, but not limited to bleeding, infection, pneumothorax, or fibrin sheath development and need for  additional procedures.  All of the patient's questions were answered, patient is  agreeable to proceed. Consent signed and in chart.    Thank you for allowing our service to participate in Mylen Mangan 's care.  Electronically Signed: D. Franky Rakers, PA-C   02/06/2024, 3:41 PM      I spent a total of  20 minutes   in face to face in clinical consultation, greater than 50% of which was counseling/coordinating care for Port-A-Cath placement   "

## 2024-02-09 ENCOUNTER — Encounter (HOSPITAL_COMMUNITY): Payer: Self-pay

## 2024-02-09 ENCOUNTER — Ambulatory Visit (HOSPITAL_COMMUNITY)
Admission: RE | Admit: 2024-02-09 | Discharge: 2024-02-09 | Disposition: A | Source: Ambulatory Visit | Attending: Physician Assistant

## 2024-02-09 DIAGNOSIS — C7801 Secondary malignant neoplasm of right lung: Secondary | ICD-10-CM | POA: Insufficient documentation

## 2024-02-09 DIAGNOSIS — Z7901 Long term (current) use of anticoagulants: Secondary | ICD-10-CM | POA: Insufficient documentation

## 2024-02-09 DIAGNOSIS — J449 Chronic obstructive pulmonary disease, unspecified: Secondary | ICD-10-CM | POA: Diagnosis not present

## 2024-02-09 DIAGNOSIS — Z95810 Presence of automatic (implantable) cardiac defibrillator: Secondary | ICD-10-CM | POA: Insufficient documentation

## 2024-02-09 DIAGNOSIS — Z87891 Personal history of nicotine dependence: Secondary | ICD-10-CM | POA: Diagnosis not present

## 2024-02-09 DIAGNOSIS — I4891 Unspecified atrial fibrillation: Secondary | ICD-10-CM | POA: Diagnosis not present

## 2024-02-09 DIAGNOSIS — Z7984 Long term (current) use of oral hypoglycemic drugs: Secondary | ICD-10-CM | POA: Diagnosis not present

## 2024-02-09 DIAGNOSIS — Z79899 Other long term (current) drug therapy: Secondary | ICD-10-CM | POA: Insufficient documentation

## 2024-02-09 DIAGNOSIS — I5022 Chronic systolic (congestive) heart failure: Secondary | ICD-10-CM | POA: Insufficient documentation

## 2024-02-09 DIAGNOSIS — E785 Hyperlipidemia, unspecified: Secondary | ICD-10-CM | POA: Insufficient documentation

## 2024-02-09 DIAGNOSIS — I447 Left bundle-branch block, unspecified: Secondary | ICD-10-CM | POA: Insufficient documentation

## 2024-02-09 DIAGNOSIS — C7802 Secondary malignant neoplasm of left lung: Secondary | ICD-10-CM | POA: Diagnosis not present

## 2024-02-09 DIAGNOSIS — E1151 Type 2 diabetes mellitus with diabetic peripheral angiopathy without gangrene: Secondary | ICD-10-CM | POA: Diagnosis not present

## 2024-02-09 DIAGNOSIS — C641 Malignant neoplasm of right kidney, except renal pelvis: Secondary | ICD-10-CM | POA: Diagnosis present

## 2024-02-09 DIAGNOSIS — I110 Hypertensive heart disease with heart failure: Secondary | ICD-10-CM | POA: Insufficient documentation

## 2024-02-09 DIAGNOSIS — I428 Other cardiomyopathies: Secondary | ICD-10-CM | POA: Diagnosis not present

## 2024-02-09 DIAGNOSIS — Z8673 Personal history of transient ischemic attack (TIA), and cerebral infarction without residual deficits: Secondary | ICD-10-CM | POA: Insufficient documentation

## 2024-02-09 DIAGNOSIS — N2889 Other specified disorders of kidney and ureter: Secondary | ICD-10-CM

## 2024-02-09 HISTORY — PX: IR IMAGING GUIDED PORT INSERTION: IMG5740

## 2024-02-09 LAB — GLUCOSE, CAPILLARY: Glucose-Capillary: 136 mg/dL — ABNORMAL HIGH (ref 70–99)

## 2024-02-09 MED ORDER — LIDOCAINE-EPINEPHRINE 1 %-1:100000 IJ SOLN
INTRAMUSCULAR | Status: AC
  Filled 2024-02-09: qty 20

## 2024-02-09 MED ORDER — SODIUM CHLORIDE 0.9 % IV SOLN: INTRAVENOUS | Status: DC

## 2024-02-09 MED ORDER — FENTANYL CITRATE (PF) 100 MCG/2ML IJ SOLN
INTRAMUSCULAR | Status: AC
  Filled 2024-02-09: qty 2

## 2024-02-09 MED ORDER — FENTANYL CITRATE (PF) 100 MCG/2ML IJ SOLN
INTRAMUSCULAR | Status: AC | PRN
  Administered 2024-02-09: 50 ug via INTRAVENOUS

## 2024-02-09 MED ORDER — MIDAZOLAM HCL 2 MG/2ML IJ SOLN
INTRAMUSCULAR | Status: AC
  Filled 2024-02-09: qty 2

## 2024-02-09 MED ORDER — LIDOCAINE-EPINEPHRINE 1 %-1:100000 IJ SOLN: 20.0000 mL | Freq: Once | INTRAMUSCULAR | Status: DC

## 2024-02-09 MED ORDER — HEPARIN SOD (PORK) LOCK FLUSH 100 UNIT/ML IV SOLN
INTRAVENOUS | Status: AC
  Filled 2024-02-09: qty 5

## 2024-02-09 MED ORDER — MIDAZOLAM HCL (PF) 2 MG/2ML IJ SOLN
INTRAMUSCULAR | Status: AC | PRN
  Administered 2024-02-09: 1 mg via INTRAVENOUS

## 2024-02-09 MED ORDER — HEPARIN SOD (PORK) LOCK FLUSH 100 UNIT/ML IV SOLN: 500.0000 [IU] | Freq: Once | INTRAVENOUS | Status: DC

## 2024-02-09 NOTE — Sedation Documentation (Signed)
 RN Umair Rosiles pulled 2 mg Versed  and 100 mcg Fentanyl  in IR room. Pt. Received 2 mg Versed  and 50 mcg Fentanyl  throughout the procedure. RN Seona Clemenson wasted 50 mcg Fentanyl  with Rolan Cohens RN in IR med room pysix.

## 2024-02-09 NOTE — Discharge Instructions (Signed)
 MAY CONTACT INTERVENTIONAL RADIOLOGY CLINIC AT [phone] WITH ANY QUESTIONS OR CONCERNS  MAY REMOVE DRESSING TOMORROW AND SHOWER

## 2024-02-09 NOTE — Procedures (Signed)
 Vascular and Interventional Radiology Procedure Note  Patient: Henry Schneider DOB: 08/30/1955 Medical Record Number: [id number] Note Date/Time: 02/09/2024 9:26 AM   Performing Physician: Thom Hall, MD Assistant(s): None  Diagnosis: met RCC  Procedure: PORT PLACEMENT  Anesthesia: Conscious Sedation Complications: None Estimated Blood Loss: Minimal  Findings:  Successful right-sided port placement, with the tip of the catheter in the proximal right atrium.  Plan: Catheter ready for use.  See detailed procedure note with images in PACS. The patient tolerated the procedure well without incident or complication and was returned to Recovery in stable condition.    Thom Hall, MD [facility]   Pager. [phone] Clinic. [phone]

## 2024-02-18 ENCOUNTER — Other Ambulatory Visit

## 2024-02-18 DIAGNOSIS — Z716 Tobacco abuse counseling: Secondary | ICD-10-CM

## 2024-02-18 NOTE — Patient Instructions (Signed)
 It was a pleasure speaking with you today!  We discussed removing 5-10 cigarettes from pack upon opening. Goal to only smoke up to 15 cigarettes per day. Follow up visit on 2/4.  Feel free to call with any questions or concerns!

## 2024-02-18 NOTE — Progress Notes (Signed)
 "  02/18/2024 Name: Henry Schneider MRN: [id number] DOB: 1955-10-04  Chief Complaint  Patient presents with   Medication Management    Duval Henry Schneider is a 69 y.o. year old male who was referred for medication management by their primary care provider, Billy Philippe SAUNDERS, NP. They presented for a telephone today.   They were referred to the pharmacist by their PCP for assistance in managing smoking cessation    Subjective:  Care Team: Primary Care Provider: Billy Philippe SAUNDERS, NP    Medication Access/Adherence  Current Pharmacy:  [facility] [phone] [address], [place] - [address] AT [facility] [address] [place] [address] Phone: [phone] Fax: [phone]  [facility] [address] [place] [address] Phone: [phone] Fax: [phone]   Patient reports affordability concerns with their medications: No  Patient reports access/transportation concerns to their pharmacy: No  Patient reports adherence concerns with their medications:  No     Tobacco Abuse: Current Med Therapy: Bupropion  SR 150mg   Tobacco Use History: Been smoking for 40 years Number of cigarettes per day: 1 pack per day (reports use to be 2 packs) Smokes first cigarette within 30 minutes after waking Triggers include driving in car and being in the habit  Quit Attempt History: Longest time ever been tobacco free 6 months Methods tried in the past include stopping cold turkey, using nicotine  gum, and chantix. Reports the chantix did not help at all Cannot use nicotine  patches due to Buerger's disease Current Quit Attempt: Has been smoking for 2 weeks, daily except for 1 day, 1 pack per day. Still taking bupropion . Triggers include being in the car. States when he gets a pack, he finishes the whole pack. Agreeable to immediately removing 5-10 from pack to reduce number of cigarettes smoked per day.   Objective:  Lab Results   Component Value Date   HGBA1C 6.8 (H) 01/05/2024    Lab Results  Component Value Date   CREATININE 1.49 (H) 02/04/2024   BUN 28 (H) 02/04/2024   NA 141 02/04/2024   K 4.8 02/04/2024   CL 107 02/04/2024   CO2 26 02/04/2024    Lab Results  Component Value Date   CHOL 153 01/05/2024   HDL 52 01/05/2024   LDLCALC 83 01/05/2024   TRIG 98 01/05/2024   CHOLHDL 2.9 01/05/2024    Medications Reviewed Today     Reviewed by Lionell Jon DEL, RPH (Pharmacist) on 02/18/24 at 1619  Med List Status: <None>   Medication Order Taking? Sig Documenting Provider Last Dose Status Informant  apixaban  (ELIQUIS ) 5 MG TABS tablet [id number]  Take 1 tablet (5 mg total) by mouth 2 (two) times daily. Jerilynn Daphne SAILOR, NP  Active   atorvastatin  (LIPITOR) 80 MG tablet [id number]  Take 1 tablet (80 mg total) by mouth daily. Sethi, Pramod S, MD  Active   buPROPion  (WELLBUTRIN  SR) 150 MG 12 hr tablet [id number] Yes TAKE 1 TABLET BY MOUTH DAILY FOR 3 DAYS, THEN INCREASE TO 1 TABLET BY MOUTH TWICE DAILY Williamson, Joanna R, NP  Active   dapagliflozin  propanediol (FARXIGA ) 10 MG TABS tablet [id number]  Take 1 tablet (10 mg total) by mouth daily. Jerilynn Daphne SAILOR, NP  Active   furosemide  (LASIX ) 20 MG tablet [id number]  Take 1 tablet (20 mg total) by mouth daily. Jerilynn Daphne SAILOR, NP  Active   lidocaine -prilocaine  (EMLA ) cream [id number]  Apply 1 Application topically as needed. Heilingoetter, Cassandra L, PA-C  Active  metFORMIN (GLUCOPHAGE) 500 MG tablet [id number]  Take 500 mg by mouth daily. [provider]  Active   methimazole  (TAPAZOLE ) 5 MG tablet [id number]  TAKE 1 TABLET (5 MG TOTAL) BY MOUTH DAILY. Williamson, Joanna R, NP  Active   metoprolol  succinate (TOPROL -XL) 50 MG 24 hr tablet [id number]  Take 2 tablets (100mg ) by mouth in the morning and 1 tablet (50mg ) in the afternoon. Jerilynn Daphne SAILOR, NP  Active   prochlorperazine  (COMPAZINE ) 10 MG tablet [id number]  Take 1 tablet (10 mg total) by  mouth every 6 (six) hours as needed. Heilingoetter, Cassandra L, PA-C  Active   spironolactone  (ALDACTONE ) 25 MG tablet [id number]  Take 0.5 tablets (12.5 mg total) by mouth daily. Jerilynn Daphne SAILOR, NP  Active               Assessment/Plan:   Tobacco Abuse - Currently uncontrolled - Provided motivational interviewing to assess tobacco use and strategies for reduction - Provided information on 1 800 QUIT NOW support program - Continue Bupropion  SR 150mg   - Counseled patient on quit techniques and other support options, patient still prefers to try to stop all cigarettes completely instead of trying to taper down -Counseled on techniques to help curb cravings such as avoiding triggers and keeping mind busy; removing 5-10 cigarettes from pack prior to starting the pack to reduce number of cigarettes smoked per day -Goal: be down to 15 cigarettes per day in 2 weeks  Follow Up Plan: 2/4  Jon VEAR [place], PharmD Clinical Pharmacist [phone]  "

## 2024-02-24 ENCOUNTER — Ambulatory Visit (INDEPENDENT_AMBULATORY_CARE_PROVIDER_SITE_OTHER): Admitting: Endocrinology

## 2024-02-24 ENCOUNTER — Encounter: Payer: Self-pay | Admitting: Endocrinology

## 2024-02-24 VITALS — BP 100/70 | HR 76 | Ht 72.0 in | Wt 277.0 lb

## 2024-02-24 DIAGNOSIS — E0590 Thyrotoxicosis, unspecified without thyrotoxic crisis or storm: Secondary | ICD-10-CM | POA: Diagnosis not present

## 2024-02-24 DIAGNOSIS — E0500 Thyrotoxicosis with diffuse goiter without thyrotoxic crisis or storm: Secondary | ICD-10-CM

## 2024-02-24 MED ORDER — METHIMAZOLE 5 MG PO TABS: 5.0000 mg | ORAL_TABLET | Freq: Every day | ORAL | 3 refills | Status: AC

## 2024-02-24 NOTE — Progress Notes (Signed)
 "  Outpatient Endocrinology Note [name], MD   Patient's Name: Henry Schneider    DOB: Nov 06, 1955    MRN: [id number]  REASON OF VISIT: New consult for hyperthyroidism  REFERRING PROVIDER:   PCP: Billy Philippe SAUNDERS, NP  HISTORY OF PRESENT ILLNESS:   Henry Schneider is a 69 y.o. old male with past medical history as listed below is presented for new consult for Graves disease / hyperthyroidism.   Discussed the use of AI scribe software for part clinical note transcription with the patient, who gave verbal consent to proceed.  Pertinent Thyroid  History: Patient is referred to endocrinology for evaluation and management of hyperthyroidism/Graves' disease, initial visit/consult on February 24, 2024.  Some of the medical records reviewed in Care Everywhere.  He was following with endocrinology in [place]  at [facility], office note in February 17, 2018 reviewed.  He was diagnosed with hyperthyroidism when he was hospitalized due to syncopal episode March 2019, at that time initial TSH undetectable with mildly elevated T4 and T3. 04/24/2017 TSH <0.01, T3=1.8, free T4=3.1.  At that time he was empirically started on methimazole  5 mg daily.  He later had checked TSI came back positive in April 2019.  He was managed has Graves' disease causing hyperthyroidism.  Methimazole  dose was adjusted in the range of 2.5 mg daily up to 10 mg daily.  He moved to [place]  from [place]  and reestablished medical care, in mid of 2025.  In October 28, 2023 he had low TSH of 0.02 and normal FT4 and normal total T3.  Patient was not taking methimazole  for few months prior to September.  Due to history of hyperthyroidism he was restarted on methimazole  5 mg daily in early October 2025.  Patient has normalization of thyroid  function test with normal TSH after being on methimazole  in December and in February 04, 2024 with TSH of 1.550.  No new symptoms or discomfort related to his thyroid   condition. No issues with bowel movements, temperature sensitivity, or heart palpitations.   He also has a history of type 2 diabetes, managed with metformin and Farxiga . His A1c was 6.8% as of December 2025. He monitors his diet closely to manage his diabetes.  Managed by primary care provider.  Other: Patient has history of atrial fibrillation / PE on anticoagulation.  History of a stroke in June 2025.  Interval history  Patient has been taking methimazole  5 mg daily in the morning.  Reports compliance.  No recent illness or fever.  No stomach issues.  No new complaints today.  Recent thyroid  function test normal as follows.  Initial visit today.   Latest Reference Range & Units 01/05/24 13:50 02/04/24 07:42 02/04/24 07:43  TSH 0.350 - 4.500 uIU/mL 0.527  1.550  Thyroxine (T4) 4.5 - 12.0 ug/dL  6.3     REVIEW OF SYSTEMS:  As per history of present illness.   PAST MEDICAL HISTORY: Past Medical History:  Diagnosis Date   A-fib [facility])    Acute ischemic left middle cerebral artery (MCA) stroke (HCC) 07/15/2023   aphasia   Aphasia due to acute cerebrovascular accident (CVA) (HCC)    Buerger's disease    Cardiac LV ejection fraction 21-30% 07/12/2023   June 2020, EF 25-30%. while in 2016, EF was only 15%   Chronic combined systolic and diastolic CHF (congestive heart failure) (HCC)    Congestive heart failure (CHF) (HCC)    Diabetes mellitus without complication (HCC)    type 2   Dyspnea  with exertion   Emphysema of lung (HCC)    Grade I diastolic dysfunction    Graves disease    Heart disease    Hyperlipidemia    Hypertension    ICD (implantable cardioverter-defibrillator) in place, battery check 09-15-23    s/p [facility] CRT-D placement   LBBB (left bundle branch block)    Memory loss, short term    Non-ischemic cardiomyopathy (HCC)    NSVT (nonsustained ventricular tachycardia) (HCC)    NYHA Class III cardiovascular function (HCC)    Obesity (BMI 30-39.9)     Peripheral vascular disease    Pulmonary embolism (HCC)    History of pulmonary emboli while on Eliquis ; A-fib   Stroke (HCC)    Syncope, cardiogenic    Systolic congestive heart failure with reduced left ventricular function, NYHA class 3 (HCC)    Thyroid  disease    Ventricular tachycardia (HCC)     PAST SURGICAL HISTORY: Past Surgical History:  Procedure Laterality Date   CATARACT EXTRACTION W/PHACO Left 01/20/2024   Procedure: PHACOEMULSIFICATION, CATARACT, WITH IOL INSERTION 11.63 01:05.8;  Surgeon: Jaye Fallow, MD;  Location: [facility];  Service: Ophthalmology;  Laterality: Left;   colonoscopy     FOOT SURGERY Right    IR CT HEAD LTD  07/12/2023   IR IMAGING GUIDED PORT INSERTION  02/09/2024   IR PERCUTANEOUS ART THROMBECTOMY/INFUSION INTRACRANIAL INC DIAG ANGIO  07/12/2023   IR US  GUIDE VASC ACCESS RIGHT  07/12/2023   RADIOLOGY WITH ANESTHESIA N/A 07/12/2023   Procedure: RADIOLOGY WITH ANESTHESIA;  Surgeon: Radiologist, Medication, MD;  Location: [facility] OR;  Service: Radiology;  Laterality: N/A;   VIDEO BRONCHOSCOPY WITH ENDOBRONCHIAL NAVIGATION Left 01/06/2024   Procedure: VIDEO BRONCHOSCOPY WITH ENDOBRONCHIAL NAVIGATION;  Surgeon: Shelah Lamar RAMAN, MD;  Location: [facility] ENDOSCOPY;  Service: Pulmonary;  Laterality: Left;   VIDEO BRONCHOSCOPY WITH ENDOBRONCHIAL ULTRASOUND  01/06/2024   Procedure: BRONCHOSCOPY, WITH EBUS;  Surgeon: Shelah Lamar RAMAN, MD;  Location: [facility] ENDOSCOPY;  Service: Pulmonary;;    ALLERGIES: Allergies[1]  FAMILY HISTORY:  Family History  Problem Relation Age of Onset   Coronary artery disease Mother    Coronary artery disease Father    Cancer Brother     SOCIAL HISTORY: Social History   Socioeconomic History   Marital status: Single    Spouse name: Not on file   Number of children: 0   Years of education: Not on file   Highest education level: High school graduate  Occupational History   Not on file  Tobacco Use   Smoking status:  Former    Current packs/day: 0.25    Average packs/day: 0.3 packs/day for 0.1 years    Types: Cigarettes    Start date: 01/14/2024    Quit date: 12/12/2023   Smokeless tobacco: Never  Vaping Use   Vaping status: Never Used  Substance and Sexual Activity   Alcohol use: Yes    Comment: occasional beer   Drug use: Yes    Types: Marijuana    Comment: when younger   Sexual activity: Not Currently  Other Topics Concern   Not on file  Social History Narrative   Not on file   Social Drivers of Health   Tobacco Use: Medium Risk (02/24/2024)   Patient History    Smoking Tobacco Use: Former    Smokeless Tobacco Use: Never    Passive Exposure: Not on file  Financial Resource Strain: Low Risk (10/28/2023)   Overall Financial Resource Strain (CARDIA)    Difficulty  of Paying Living Expenses: Not hard at all  Food Insecurity: No Food Insecurity (10/28/2023)   Epic    Worried About Programme Researcher, Broadcasting/film/video in the Last Year: Never true    Ran Out of Food in the Last Year: Never true  Transportation Needs: No Transportation Needs (10/28/2023)   Epic    Lack of Transportation (Medical): No    Lack of Transportation (Non-Medical): No  Physical Activity: Sufficiently Active (10/28/2023)   Exercise Vital Sign    Days of Exercise per Week: 6 days    Minutes of Exercise per Session: 50 min  Stress: No Stress Concern Present (10/28/2023)   [employer] of Occupational Health - Occupational Stress Questionnaire    Feeling of Stress: Not at all  Social Connections: Socially Isolated (10/28/2023)   Social Connection and Isolation Panel    Frequency of Communication with Friends and Family: Never    Frequency of Social Gatherings with Friends and Family: Never    Attends Religious Services: Never    Database Administrator or Organizations: Yes    Attends Banker Meetings: Never    Marital Status: Never married  Depression (PHQ2-9): Low Risk (02/04/2024)   Depression (PHQ2-9)    PHQ-2  Score: 0  Alcohol Screen: Low Risk (10/28/2023)   Alcohol Screen    Last Alcohol Screening Score (AUDIT): 1  Housing: Low Risk (10/28/2023)   Epic    Unable to Pay for Housing in the Last Year: No    Number of Times Moved in the Last Year: 1    Homeless in the Last Year: No  Utilities: Not At Risk (10/28/2023)   Epic    Threatened with loss of utilities: No  Health Literacy: Adequate Health Literacy (10/28/2023)   B1300 Health Literacy    Frequency of need for help with medical instructions: Never    MEDICATIONS:  Current Outpatient Medications  Medication Sig Dispense Refill   apixaban  (ELIQUIS ) 5 MG TABS tablet Take 1 tablet (5 mg total) by mouth 2 (two) times daily. 60 tablet 0   atorvastatin  (LIPITOR) 80 MG tablet Take 1 tablet (80 mg total) by mouth daily. 30 tablet 3   buPROPion  (WELLBUTRIN  SR) 150 MG 12 hr tablet TAKE 1 TABLET BY MOUTH DAILY FOR 3 DAYS, THEN INCREASE TO 1 TABLET BY MOUTH TWICE DAILY 180 tablet 1   dapagliflozin  propanediol (FARXIGA ) 10 MG TABS tablet Take 1 tablet (10 mg total) by mouth daily. 30 tablet 0   furosemide  (LASIX ) 20 MG tablet Take 1 tablet (20 mg total) by mouth daily. 30 tablet 0   lidocaine -prilocaine  (EMLA ) cream Apply 1 Application topically as needed. 30 g 2   metFORMIN (GLUCOPHAGE) 500 MG tablet Take 500 mg by mouth daily.     metoprolol  succinate (TOPROL -XL) 50 MG 24 hr tablet Take 2 tablets (100mg ) by mouth in the morning and 1 tablet (50mg ) in the afternoon. 30 tablet 0   prochlorperazine  (COMPAZINE ) 10 MG tablet Take 1 tablet (10 mg total) by mouth every 6 (six) hours as needed. 30 tablet 2   spironolactone  (ALDACTONE ) 25 MG tablet Take 0.5 tablets (12.5 mg total) by mouth daily. 30 tablet 0   methimazole  (TAPAZOLE ) 5 MG tablet Take 1 tablet (5 mg total) by mouth daily. 90 tablet 3   No current facility-administered medications for this visit.    PHYSICAL EXAM: Vitals:   02/24/24 1029  BP: 100/70  Pulse: 76  SpO2: 96%  Weight: 277 lb  (125.6 kg)  Height: 6' (1.829 m)   Body mass index is 37.57 kg/m.  Wt Readings from Last 3 Encounters:  02/24/24 277 lb (125.6 kg)  02/04/24 273 lb 12 oz (124.2 kg)  01/26/24 275 lb 9.6 oz (125 kg)    General: Well developed, well nourished male in no apparent distress.  HEENT: AT/[place], no external lesions. Hearing intact to the spoken word Eyes: EOMI. No stare, proptosis or lid lag. Conjunctiva clear and no icterus. No erythema or watering Neck: Trachea midline, neck supple without appreciable thyromegaly or lymphadenopathy and no palpable thyroid  nodules Lungs: Clear to auscultation, no wheeze. Respirations not labored Heart: S1S2, Regular in rate and rhythm.  Abdomen: Soft, non tender, non distended Neurologic: Alert, oriented, normal speech, deep tendon biceps reflexes normal Extremities: No pedal pitting edema, no tremors of outstretched hands Skin: Warm, color good.  Mild dry skin.   Psychiatric: Does not appear depressed or anxious  PERTINENT HISTORIC LABORATORY AND IMAGING STUDIES:  All pertinent laboratory results were reviewed. Please see HPI also for further details.   TSH  Date Value Ref Range Status  02/04/2024 1.550 0.350 - 4.500 uIU/mL Final    Comment:    Performed at [facility], [address], [place], [place] [address]  01/05/2024 0.527 0.450 - 4.500 uIU/mL Final  10/28/2023 0.02 (L) 0.35 - 5.50 uIU/mL Final   T3, Total  Date Value Ref Range Status  10/31/2023 119 76 - 181 ng/dL Final    Lab Results  Component Value Date   FREET4 1.29 10/31/2023   TSH 1.550 02/04/2024   TSH 0.527 01/05/2024   TSH 0.02 (L) 10/28/2023    No results found for: THYROTRECAB  Lab Results  Component Value Date   TSH 1.550 02/04/2024   TSH 0.527 01/05/2024   TSH 0.02 (L) 10/28/2023   FREET4 1.29 10/31/2023     No results found for: TSI   No components found for: TRAB     Latest Reference Range & Units 10/28/23 09:14 10/31/23 08:23 01/05/24  13:50 02/04/24 07:42 02/04/24 07:43  TSH 0.350 - 4.500 uIU/mL 0.02 (L)  0.527  1.550  Triiodothyronine (T3) 76 - 181 ng/dL  880     U5,Qmzz(Ipmzru) 0.60 - 1.60 ng/dL  8.70     Thyroxine (T4) 4.5 - 12.0 ug/dL    6.3   (L): Data is abnormally low  ASSESSMENT / PLAN  1. Graves disease   2. Hyperthyroidism    Patient was diagnosed with hyperthyroidism in March 2019 in the setting of when she had cardiogenic shock and hospitalized.  Patient was later found to positive/elevated TSI consistent with having Graves' disease causing hyperthyroidism.  Patient was started on antithyroid medication/methimazol had taken variable dose 2.5 to 10 mg daily in the past.,   Patient was following with endocrinology when he was in [place] .  After his moved and establishing medical care methimazole  was restarted 5 mg daily in October 2025.  He has normalized on all thyroid  function test and remained euthyroid after being on methimazole  with normal thyroid  function test in December and January 2026.  He is clinically euthyroid.  Plan: - Graves disease managed with methimazole  5 mg daily.   - Discussed potential dose reduction based on future labs and symptoms.  - Continue methimazole  5 mg daily. - Follow-up in four months. - Order blood test before next appointment.  I would also like to check thyroid  autoantibodies for Graves' disease including thyroid  function test at next follow-up visit.   The  three options of therapy for hyperthyroidism were discussed with the patient, including thionamide drug therapy, thyroidectomy, and radioactive iodine ablation. I reviewed the possible complications of rash, agranulocytosis, or liver dysfunction associated with anti-thyroid  therapies. I reviewed the risks of hemorrhage, hypocalcemia, hoarseness and hypothyroidism after thyroidectomy. Regarding radioactive iodine ablation, I informed the patient that most patients treated with radioactive iodine can be cured of  hyperthyroidism with a single dose, but to about 15-20% may require an additional dose. I emphasized that most patients treated with radioactive iodine develop permanent hypothyroidism, requiring lifelong replacement with thyroid  hormone. I discussed the rare occurrence of transient increase in thyroid  hormone levels after radioactive iodine and associated with symptoms, possible worsening of Grave's eye disease, and the likely small but not insignificant risks associated with radiation exposure of this kind.   Will treat with antithyroid medication at this time.   Kale was seen today for graves' disease.  Diagnoses and all orders for this visit:  Graves disease -     T4, free -     T3, free -     TSH -     TRAb (TSH Receptor Binding Antibody) -     Thyroid  stimulating immunoglobulin -     methimazole  (TAPAZOLE ) 5 MG tablet; Take 1 tablet (5 mg total) by mouth daily.  Hyperthyroidism    DISPOSITION Follow up in clinic in 4 months suggested.  All questions answered and patient verbalized understanding of the plan.  Wayland Baik, MD [facility] [address] [place], [address] [place], [place] [address] Phone # [phone]   At least part of this note was generated using voice recognition software. Inadvertent word errors may have occurred, which were not recognized during the proofreading process.     [1] No Known Allergies  "

## 2024-02-24 NOTE — Patient Instructions (Signed)
" °  VISIT SUMMARY: Henry Schneider is a 69 year old male who came in for a follow-up on his thyroid  condition. He has a history of hyperthyroidism, diagnosed in 2019, and has been taking methimazole . He also has a history of stroke and type 2 diabetes. He recently moved from [place]  to [place]  and is establishing care with new providers.  YOUR PLAN: -GRAVES DISEASE: Graves disease is an autoimmune disorder that causes hyperthyroidism, where the thyroid  gland produces too much thyroid  hormone. Your condition is being managed with methimazole  5 mg daily. Your blood work has normalized, and we discussed the possibility of reducing your dose based on future lab results and symptoms. It is important to continue taking your medication as prescribed to prevent heart-related complications. We will follow up in four months and you should have a blood test done before your next appointment.  INSTRUCTIONS: Please continue taking methimazole  5 mg daily. Schedule a follow-up appointment in four months and ensure you have a blood test done before the next visit.    Contains text generated by [employer].   "

## 2024-02-25 ENCOUNTER — Telehealth: Payer: Self-pay | Admitting: Adult Health

## 2024-02-25 NOTE — Telephone Encounter (Signed)
 MYC cxl

## 2024-02-26 ENCOUNTER — Inpatient Hospital Stay: Admitting: Internal Medicine

## 2024-02-26 ENCOUNTER — Encounter: Payer: Self-pay | Admitting: Cardiology

## 2024-02-26 ENCOUNTER — Other Ambulatory Visit: Payer: Self-pay

## 2024-02-26 ENCOUNTER — Inpatient Hospital Stay

## 2024-02-26 VITALS — BP 102/80 | HR 66 | Resp 18

## 2024-02-26 VITALS — BP 96/78 | HR 69 | Temp 97.7°F | Resp 17 | Ht 72.0 in | Wt 278.0 lb

## 2024-02-26 DIAGNOSIS — C641 Malignant neoplasm of right kidney, except renal pelvis: Secondary | ICD-10-CM

## 2024-02-26 DIAGNOSIS — Z5112 Encounter for antineoplastic immunotherapy: Secondary | ICD-10-CM | POA: Diagnosis not present

## 2024-02-26 LAB — CBC WITH DIFFERENTIAL (CANCER CENTER ONLY)
Abs Immature Granulocytes: 0.05 10*3/uL (ref 0.00–0.07)
Basophils Absolute: 0.1 10*3/uL (ref 0.0–0.1)
Basophils Relative: 1 %
Eosinophils Absolute: 0.2 10*3/uL (ref 0.0–0.5)
Eosinophils Relative: 2 %
HCT: 51.6 % (ref 39.0–52.0)
Hemoglobin: 16.8 g/dL (ref 13.0–17.0)
Immature Granulocytes: 1 %
Lymphocytes Relative: 26 %
Lymphs Abs: 2.4 10*3/uL (ref 0.7–4.0)
MCH: 30.8 pg (ref 26.0–34.0)
MCHC: 32.6 g/dL (ref 30.0–36.0)
MCV: 94.7 fL (ref 80.0–100.0)
Monocytes Absolute: 0.8 10*3/uL (ref 0.1–1.0)
Monocytes Relative: 8 %
Neutro Abs: 5.9 10*3/uL (ref 1.7–7.7)
Neutrophils Relative %: 62 %
Platelet Count: 201 10*3/uL (ref 150–400)
RBC: 5.45 MIL/uL (ref 4.22–5.81)
RDW: 14.5 % (ref 11.5–15.5)
WBC Count: 9.4 10*3/uL (ref 4.0–10.5)
nRBC: 0 % (ref 0.0–0.2)

## 2024-02-26 LAB — CMP (CANCER CENTER ONLY)
ALT: 31 U/L (ref 0–44)
AST: 24 U/L (ref 15–41)
Albumin: 4.1 g/dL (ref 3.5–5.0)
Alkaline Phosphatase: 115 U/L (ref 38–126)
Anion gap: 12 (ref 5–15)
BUN: 35 mg/dL — ABNORMAL HIGH (ref 8–23)
CO2: 22 mmol/L (ref 22–32)
Calcium: 9.2 mg/dL (ref 8.9–10.3)
Chloride: 108 mmol/L (ref 98–111)
Creatinine: 1.3 mg/dL — ABNORMAL HIGH (ref 0.61–1.24)
GFR, Estimated: >60 mL/min
Glucose, Bld: 176 mg/dL — ABNORMAL HIGH (ref 70–99)
Potassium: 4.1 mmol/L (ref 3.5–5.1)
Sodium: 142 mmol/L (ref 135–145)
Total Bilirubin: 0.7 mg/dL (ref 0.0–1.2)
Total Protein: 7 g/dL (ref 6.5–8.1)

## 2024-02-26 MED ORDER — FAMOTIDINE IN NACL 20-0.9 MG/50ML-% IV SOLN
20.0000 mg | Freq: Once | INTRAVENOUS | Status: AC
  Administered 2024-02-26: 20 mg via INTRAVENOUS
  Filled 2024-02-26: qty 50

## 2024-02-26 MED ORDER — SODIUM CHLORIDE 0.9 % IV SOLN
3.2000 mg/kg | Freq: Once | INTRAVENOUS | Status: AC
  Administered 2024-02-26: 400 mg via INTRAVENOUS
  Filled 2024-02-26: qty 4

## 2024-02-26 MED ORDER — SODIUM CHLORIDE 0.9 % IV SOLN
1.0000 mg/kg | Freq: Once | INTRAVENOUS | Status: AC
  Administered 2024-02-26: 125 mg via INTRAVENOUS
  Filled 2024-02-26: qty 25

## 2024-02-26 MED ORDER — SODIUM CHLORIDE 0.9 % IV SOLN: INTRAVENOUS | Status: DC

## 2024-02-26 MED ORDER — DIPHENHYDRAMINE HCL 50 MG/ML IJ SOLN
25.0000 mg | Freq: Once | INTRAMUSCULAR | Status: AC
  Administered 2024-02-26: 25 mg via INTRAVENOUS
  Filled 2024-02-26: qty 1

## 2024-02-26 NOTE — Progress Notes (Signed)
 Upon completion of treatment Pt's HR 38. This RN checked manually and found HR to be 36 BPM, pauses in heartbeat were also noted upon assessment. This RN made Dr. Sherrod aware. Per Dr. Sherrod obtain EKG. This RN obtained EKG on Pt and found Pt to have PVCs. HR 62-68 BPM during EKG.  This RN made Dr. Sherrod aware of results. Dr. Sherrod recommended Pt f/u with cardiology. Per Dr. Sherrod Pt OK for discharge.  This RN made Pt aware of MD's recommendations and provided strict ED precautions. Pt provided teach back, verbalized understanding and agreeable with plan.

## 2024-02-26 NOTE — Progress Notes (Signed)
 "     [facility] [phone]   [phone]  OFFICE PROGRESS NOTE  Henry Philippe SAUNDERS, NP [address] [place] [address]  DIAGNOSIS: Metastatic renal cell carcinoma presented with right renal mass in addition to left pleural-based lung mass and multiple smaller nodular lesion in addition to metabolic activity in the right sacroiliac joint. Diagnosed in December 2025.    PRIOR THERAPY: None   CURRENT THERAPY: Palliative systemic immunotherapy with ipilimumab  1 mg/KG and nivolumab  3 mg/KG IV every 3 weeks for 4 cycles.  This will be followed by maintenance nivolumab  every 4 weeks.  His first dose is expected on 02/04/23.  Status post 1 cycle.  INTERVAL HISTORY: Henry Schneider 69 y.o. male returns to the clinic today for follow-up visit.Discussed the use of AI scribe software for clinical note transcription with the patient, who gave verbal consent to proceed.  History of Present Illness Henry Schneider is a 69 year old male with metastatic clear cell renal cell carcinoma presenting for evaluation prior to cycle two of palliative immunotherapy.  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 He has completed one cycle and is being assessed prior to cycle two.  He tolerated the initial cycle without treatment-related toxicities. He has not experienced rash, diarrhea, chest pain, respiratory symptoms, nausea, or vomiting.  He denies weight loss and reports increased oral intake. He feels well overall, with no new symptoms or complaints since the last cycle.  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     MEDICAL HISTORY: Past Medical History:  Diagnosis Date   A-fib [facility])    Acute ischemic left middle cerebral artery (MCA) stroke (HCC) 07/15/2023   aphasia   Aphasia due to acute cerebrovascular accident (CVA) [facility])    Buerger's disease    Cardiac LV ejection fraction 21-30% 07/12/2023   June 2020, EF 25-30%. while in 2016, EF was only 15%   Chronic combined systolic and diastolic CHF (congestive heart failure) (HCC)    Congestive heart failure (CHF) (HCC)    Diabetes mellitus without complication (HCC)    type 2   Dyspnea    with exertion   Emphysema of lung (HCC)    Grade I diastolic dysfunction    Graves disease    Heart disease    Hyperlipidemia    Hypertension    ICD (implantable cardioverter-defibrillator) in place, battery check 09-15-23    s/p [facility] [employer] CRT-D placement   LBBB (left bundle branch block)    Memory loss, short term    Non-ischemic cardiomyopathy (HCC)    NSVT (nonsustained ventricular tachycardia) (HCC)    NYHA Class III cardiovascular function (HCC)    Obesity (BMI 30-39.9)    Peripheral vascular disease    Pulmonary embolism (HCC)    History of pulmonary emboli while on Eliquis ; A-fib   Stroke (HCC)    Syncope, cardiogenic    Systolic congestive heart failure with reduced left ventricular function, NYHA class 3 (HCC)    Thyroid  disease    Ventricular tachycardia (HCC)     ALLERGIES:  has no known allergies.  MEDICATIONS:  Current Outpatient Medications  Medication Sig Dispense Refill   apixaban  (ELIQUIS ) 5 MG  TABS tablet Take 1 tablet (5 mg total) by mouth 2 (two) times daily. 60 tablet 0   atorvastatin  (LIPITOR) 80 MG tablet Take 1 tablet (80 mg total) by mouth daily. 30 tablet 3   buPROPion  (WELLBUTRIN  SR) 150 MG 12 hr tablet TAKE 1 TABLET BY MOUTH DAILY FOR 3 DAYS, THEN INCREASE TO 1 TABLET BY MOUTH TWICE DAILY 180 tablet 1   dapagliflozin  propanediol (FARXIGA ) 10 MG TABS tablet Take 1 tablet (10 mg total) by mouth  daily. 30 tablet 0   furosemide  (LASIX ) 20 MG tablet Take 1 tablet (20 mg total) by mouth daily. 30 tablet 0   lidocaine -prilocaine  (EMLA ) cream Apply 1 Application topically as needed. 30 g 2   metFORMIN (GLUCOPHAGE) 500 MG tablet Take 500 mg by mouth daily.     metoprolol  succinate (TOPROL -XL) 50 MG 24 hr tablet Take 2 tablets (100mg ) by mouth in the morning and 1 tablet (50mg ) in the afternoon. 30 tablet 0   prochlorperazine  (COMPAZINE ) 10 MG tablet Take 1 tablet (10 mg total) by mouth every 6 (six) hours as needed. 30 tablet 2   spironolactone  (ALDACTONE ) 25 MG tablet Take 0.5 tablets (12.5 mg total) by mouth daily. 30 tablet 0   methimazole  (TAPAZOLE ) 5 MG tablet Take 1 tablet (5 mg total) by mouth daily. 90 tablet 3   No current facility-administered medications for this visit.    SURGICAL HISTORY:  Past Surgical History:  Procedure Laterality Date   CATARACT EXTRACTION W/PHACO Left 01/20/2024   Procedure: PHACOEMULSIFICATION, CATARACT, WITH IOL INSERTION 11.63 01:05.8;  Surgeon: Jaye Fallow, MD;  Location: [facility];  Service: Ophthalmology;  Laterality: Left;   colonoscopy     FOOT SURGERY Right    IR CT HEAD LTD  07/12/2023   IR IMAGING GUIDED PORT INSERTION  02/09/2024   IR PERCUTANEOUS ART THROMBECTOMY/INFUSION INTRACRANIAL INC DIAG ANGIO  07/12/2023   IR US  GUIDE VASC ACCESS RIGHT  07/12/2023   RADIOLOGY WITH ANESTHESIA N/A 07/12/2023   Procedure: RADIOLOGY WITH ANESTHESIA;  Surgeon: Radiologist, Medication, MD;  Location: [facility] OR;  Service: Radiology;  Laterality: N/A;   VIDEO BRONCHOSCOPY WITH ENDOBRONCHIAL NAVIGATION Left 01/06/2024   Procedure: VIDEO BRONCHOSCOPY WITH ENDOBRONCHIAL NAVIGATION;  Surgeon: Shelah Lamar RAMAN, MD;  Location: [facility];  Service: Pulmonary;  Laterality: Left;   VIDEO BRONCHOSCOPY WITH ENDOBRONCHIAL ULTRASOUND  01/06/2024   Procedure: BRONCHOSCOPY, WITH EBUS;  Surgeon: Shelah Lamar RAMAN, MD;  Location: [facility];  Service:  Pulmonary;;    REVIEW OF SYSTEMS:  A comprehensive review of systems was negative.   PHYSICAL EXAMINATION: General appearance: alert, cooperative, fatigued, and no distress Head: Normocephalic, without obvious abnormality, atraumatic Neck: no adenopathy, no JVD, supple, symmetrical, trachea midline, and thyroid  not enlarged, symmetric, no tenderness/mass/nodules Lymph nodes: Cervical, supraclavicular, and axillary nodes normal. Resp: clear to auscultation bilaterally Back: symmetric, no curvature. ROM normal. No CVA tenderness. Cardio: regular rate and rhythm, S1, S2 normal, no murmur, click, rub or gallop GI: soft, non-tender; bowel sounds normal; no masses,  no organomegaly Extremities: extremities normal, atraumatic, no cyanosis or edema  ECOG PERFORMANCE STATUS: 1 - Symptomatic but completely ambulatory  Blood pressure 96/78, pulse 69, temperature 97.7 F (36.5 C), temperature source Temporal, resp. rate 17, height 6' (1.829 m), weight 278 lb (126.1 kg), SpO2 98%.  LABORATORY DATA: Lab Results  Component Value Date   WBC 9.4 02/26/2024   HGB 16.8 02/26/2024   HCT 51.6 02/26/2024   MCV 94.7 02/26/2024   PLT 201 02/26/2024  Chemistry      Component Value Date/Time   NA 141 02/04/2024 0742   K 4.8 02/04/2024 0742   CL 107 02/04/2024 0742   CO2 26 02/04/2024 0742   BUN 28 (H) 02/04/2024 0742   CREATININE 1.49 (H) 02/04/2024 0742      Component Value Date/Time   CALCIUM  9.6 02/04/2024 0742   ALKPHOS 103 02/04/2024 0742   AST 26 02/04/2024 0742   ALT 31 02/04/2024 0742   BILITOT 1.0 02/04/2024 0742       RADIOGRAPHIC STUDIES: IR IMAGING GUIDED PORT INSERTION Result Date: 02/09/2024 INDICATION: History of metastatic renal cell carcinoma. Starting IV immunotherapy on APPROX.02/04/23 EXAM: IMPLANTED PORT A CATH PLACEMENT WITH ULTRASOUND AND FLUOROSCOPIC GUIDANCE MEDICATIONS: None ANESTHESIA/SEDATION: Moderate (conscious) sedation was employed during this procedure. A  total of Versed  2 mg and Fentanyl  50 mcg was administered intravenously. Moderate Sedation Time: 20 minutes. The patient's level of consciousness and vital signs were monitored continuously by radiology nursing throughout the procedure under my direct supervision. FLUOROSCOPY: Radiation Exposure Index and estimated peak skin dose (PSD); Reference air kerma (RAK), 1 mGy. COMPLICATIONS: None immediate. PROCEDURE: The procedure, risks, benefits, and alternatives were explained to the patient. Questions regarding the procedure were encouraged and answered. The patient understands and consents to the procedure. The RIGHT neck and chest were prepped with chlorhexidine  in a sterile fashion, and a sterile drape was applied covering the operative field. Maximum barrier sterile technique with sterile gowns and gloves were used for the procedure. A timeout was performed prior to the initiation of the procedure. Local anesthesia was provided with 1% lidocaine  with epinephrine . After creating a small venotomy incision, a micropuncture kit was utilized to access the internal jugular vein under direct, real-time ultrasound guidance. Ultrasound image documentation was performed. The microwire was kinked to measure appropriate catheter length. A subcutaneous port pocket was then created along the upper chest wall utilizing a combination of sharp and blunt dissection. The pocket was irrigated with sterile saline. A single lumen power injectable port was chosen for placement. The 8 Fr catheter was tunneled from the port pocket site to the venotomy incision. The port was placed in the pocket. The external catheter was trimmed to appropriate length. At the venotomy, an 8 Fr peel-away sheath was placed over a guidewire under fluoroscopic guidance. The catheter was then placed through the sheath and the sheath was removed. Final catheter positioning was confirmed and documented with a fluoroscopic spot radiograph. The port was accessed  with a Huber needle, aspirated and flushed with heparinized saline. The port pocket incision was closed with interrupted 3-0 Vicryl suture then Dermabond was applied, including at the venotomy incision. Dressings were placed. The patient tolerated the procedure well without immediate post procedural complication. IMPRESSION: Successful placement of a RIGHT internal jugular approach power injectable Port-A-Cath. The tip of the catheter is positioned within the proximal RIGHT atrium. The catheter is ready for immediate use. Thom Hall, MD [facility] Electronically Signed   By: Thom Hall M.D.   On: 02/09/2024 10:57    ASSESSMENT AND PLAN: This is a very pleasant 69 years old white male with Metastatic renal cell carcinoma presented with right renal mass in addition to left pleural-based lung mass and multiple smaller nodular lesion in addition to metabolic activity in the right sacroiliac joint. Diagnosed in December 2025. He is currently undergoing palliative treatment with immunotherapy with ipilimumab  1 mg/KG and nivolumab  3 mg/KG every 3 weeks.  Status  post 1 cycle. He tolerated the first cycle of his treatment fairly well. Assessment and Plan Assessment & Plan Stage IV metastatic clear cell renal cell carcinoma Metastatic clear cell renal cell carcinoma, managed with palliative immunotherapy. He tolerated the initial cycle of nivolumab  plus ipilimumab  without immunotherapy-related toxicities. Laboratory results remain stable and he reports no significant weight loss or new symptoms. Therapy is proceeding as planned with ongoing monitoring for adverse effects. - Administer cycle two of nivolumab  (3 mg/kg) plus ipilimumab  (1 mg/kg) today. - Schedule follow-up in three weeks for assessment prior to cycle three. The patient was advised to call immediately if he has any concerning symptoms in the interval.  The patient voices understanding of  current disease status and treatment options and is in agreement with the current care plan.  All questions were answered. The patient knows to call the clinic with any problems, questions or concerns. We can certainly see the patient much sooner if necessary.  The total time spent in the appointment was 20 minutes including review of chart and various tests results, discussions about plan of care and coordination of care plan .   Disclaimer: This note was dictated with voice recognition software. Similar sounding words can inadvertently be transcribed and may not be corrected upon review.        "

## 2024-02-26 NOTE — Telephone Encounter (Signed)
 [employer] monitoring site and there are no new transmissions as of 1329. Will check again later today.

## 2024-02-26 NOTE — CV Procedure (Signed)
" °  Device system confirmed to be MRI conditional, with implant date > 6 weeks ago, and no evidence of abandoned or epicardial leads in review of most recent CXR  Device last cleared by EP Provider: Cleared in 06/2023  Clearance is good through for 1 year as long as parameters remain stable at time of check. If pt undergoes a cardiac device procedure during that time, they should be re-cleared.   Tachy-therapies to be programmed off if applicable with device back to pre-MRI settings after completion of exam.  [employer] was available remotely to assist in programming recommendations.   Rocky Catalan, RT  02/26/2024 9:57 AM     "

## 2024-02-26 NOTE — Patient Instructions (Signed)
 [facility] - A DEPT OF [facility]  Discharge Instructions: Thank you for choosing [facility] to provide your oncology and hematology care.   If you have a lab appointment with the Cancer Center, please go directly to the Cancer Center and check in at the registration area.   Wear comfortable clothing and clothing appropriate for easy access to any Portacath or PICC line.   We strive to give you quality time with your provider. You may need to reschedule your appointment if you arrive late (15 or more minutes).  Arriving late affects you and other patients whose appointments are after yours.  Also, if you miss three or more appointments without notifying the office, you may be dismissed from the clinic at the provider's discretion.      For prescription refill requests, have your pharmacy contact our office and allow 72 hours for refills to be completed.    Today you received the following chemotherapy and/or immunotherapy agents: Opdivo/Yervoy      To help prevent nausea and vomiting after your treatment, we encourage you to take your nausea medication as directed.  BELOW ARE SYMPTOMS THAT SHOULD BE REPORTED IMMEDIATELY: *FEVER GREATER THAN 100.4 F (38 C) OR HIGHER *CHILLS OR SWEATING *NAUSEA AND VOMITING THAT IS NOT CONTROLLED WITH YOUR NAUSEA MEDICATION *UNUSUAL SHORTNESS OF BREATH *UNUSUAL BRUISING OR BLEEDING *URINARY PROBLEMS (pain or burning when urinating, or frequent urination) *BOWEL PROBLEMS (unusual diarrhea, constipation, pain near the anus) TENDERNESS IN MOUTH AND THROAT WITH OR WITHOUT PRESENCE OF ULCERS (sore throat, sores in mouth, or a toothache) UNUSUAL RASH, SWELLING OR PAIN  UNUSUAL VAGINAL DISCHARGE OR ITCHING   Items with * indicate a potential emergency and should be followed up as soon as possible or go to the Emergency Department if any problems should occur.  Please show the CHEMOTHERAPY ALERT CARD or  IMMUNOTHERAPY ALERT CARD at check-in to the Emergency Department and triage nurse.  Should you have questions after your visit or need to cancel or reschedule your appointment, please contact [facility] - A DEPT OF [facility]  Dept: [phone]  and follow the prompts.  Office hours are 8:00 a.m. to 4:30 p.m. Monday - Friday. Please note that voicemails left after 4:00 p.m. may not be returned until the following business day.  We are closed weekends and major holidays. You have access to a nurse at all times for urgent questions. Please call the main number to the clinic Dept: [phone] and follow the prompts.   For any non-urgent questions, you may also contact your provider using MyChart. We now offer e-Visits for anyone 11 and older to request care online for non-urgent symptoms. For details visit [email or web address].   Also download the MyChart app! Go to the app store, search "MyChart", open the app, select [place], and log in with your MyChart username and password.

## 2024-02-26 NOTE — Progress Notes (Signed)
Ipilimumab (YERVOY) Patient Monitoring Assessment   Is the patient experiencing any of the following general symptoms?:  []Difficulty performing normal activities []Feeling sluggish or cold all the time []Unusual weight gain []Constant or unusual headaches []Feeling dizzy or faint []Changes in eyesight (blurry vision, double vision, or other vision problems) []Changes in mood or behavior (ex: decreased sex drive, irritability, or forgetfulness) []Starting new medications (ex: steroids, other medications that lower immune response) [x]Patient is not experiencing any of the general symptoms above.    Gastrointestinal  Patient is having 1 bowel movements each day.  Is this different from baseline? []Yes [x]No Are your stools watery or do they have a foul smell? []Yes [x]No Have you seen blood in your stools? []Yes [x]No Are your stools dark, tarry, or sticky? []Yes [x]No Are you having pain or tenderness in your belly? []Yes [x]No  Skin Does your skin itch? []Yes [x]No Do you have a rash? []Yes [x]No Has your skin blistered and/or peeled? []Yes [x]No Do you have sores in your mouth? []Yes [x]No  Hepatic Has your urine been dark or tea colored? []Yes [x]No Have you noticed that your skin or the whites of your eyes are turning yellow? []Yes [x]No Are you bleeding or bruising more easily than normal? []Yes [x]No Are you nauseous and/or vomiting? []Yes [x]No Do you have pain on the right side of your stomach? []Yes [x]No  Neurologic  Are you having unusual weakness of legs, arms, or face? []Yes [x]No Are you having numbness or tingling in your hands or feet? []Yes [x]No  Henry Schneider Henry Schneider  

## 2024-03-02 ENCOUNTER — Ambulatory Visit (HOSPITAL_COMMUNITY)
Admission: RE | Admit: 2024-03-02 | Discharge: 2024-03-02 | Disposition: A | Source: Ambulatory Visit | Attending: Physician Assistant

## 2024-03-02 DIAGNOSIS — C649 Malignant neoplasm of unspecified kidney, except renal pelvis: Secondary | ICD-10-CM | POA: Diagnosis not present

## 2024-03-02 DIAGNOSIS — Z8673 Personal history of transient ischemic attack (TIA), and cerebral infarction without residual deficits: Secondary | ICD-10-CM

## 2024-03-02 DIAGNOSIS — N2889 Other specified disorders of kidney and ureter: Secondary | ICD-10-CM

## 2024-03-02 MED ORDER — GADOBUTROL 1 MMOL/ML IV SOLN
10.0000 mL | Freq: Once | INTRAVENOUS | Status: AC | PRN
  Administered 2024-03-02: 10 mL via INTRAVENOUS

## 2024-03-02 NOTE — Progress Notes (Signed)
 Patient was monitored by this RN during MRI scan due to presence of a pacemaker. Cardiac rhythm was continuously monitored throughout the procedure. Prior to the start of the scan, the pacemaker was placed in MRI-safe mode by the MRI technician and/or pacemaker representative. Patient was programmed with the remote in feature with the [facility] rep. Following the completion of the scan, the device was returned to its pre-MRI settings. Neurological status and orientation post-procedure were unchanged from baseline.   Pre-procedure Heart Rate (Prior to being placed in MRI safe mode):64 bpm Post-procedure Heart Rate (Once pacemaker is returned to baseline mode):  70 bpm

## 2024-03-03 ENCOUNTER — Telehealth: Payer: Self-pay

## 2024-03-03 ENCOUNTER — Other Ambulatory Visit

## 2024-03-03 NOTE — Progress Notes (Signed)
" ° °  03/03/2024  Patient ID: Henry Schneider, male   DOB: [birth year], 69 y.o.   MRN: [id number]  Attempted to contact patient for scheduled appointment for smoking cessation Left HIPAA compliant message for patient to return my call at their convenience.   Jon VEAR Lindau, PharmD Clinical Pharmacist [phone]   "

## 2024-03-09 ENCOUNTER — Ambulatory Visit

## 2024-03-17 ENCOUNTER — Inpatient Hospital Stay: Attending: Nurse Practitioner

## 2024-03-17 ENCOUNTER — Inpatient Hospital Stay

## 2024-03-17 ENCOUNTER — Inpatient Hospital Stay: Admitting: Internal Medicine

## 2024-04-06 ENCOUNTER — Inpatient Hospital Stay: Admitting: Internal Medicine

## 2024-04-06 ENCOUNTER — Inpatient Hospital Stay

## 2024-04-06 ENCOUNTER — Inpatient Hospital Stay: Attending: Nurse Practitioner

## 2024-04-26 ENCOUNTER — Ambulatory Visit

## 2024-04-27 ENCOUNTER — Inpatient Hospital Stay

## 2024-04-27 ENCOUNTER — Inpatient Hospital Stay: Admitting: Internal Medicine

## 2024-05-25 ENCOUNTER — Inpatient Hospital Stay

## 2024-05-25 ENCOUNTER — Inpatient Hospital Stay: Attending: Nurse Practitioner

## 2024-05-25 ENCOUNTER — Inpatient Hospital Stay: Admitting: Internal Medicine

## 2024-06-08 ENCOUNTER — Ambulatory Visit

## 2024-06-17 ENCOUNTER — Other Ambulatory Visit

## 2024-06-23 ENCOUNTER — Ambulatory Visit: Admitting: Endocrinology

## 2024-07-26 ENCOUNTER — Ambulatory Visit: Admitting: Adult Health

## 2024-09-07 ENCOUNTER — Ambulatory Visit

## 2024-12-07 ENCOUNTER — Ambulatory Visit

## 2025-03-08 ENCOUNTER — Ambulatory Visit
